# Patient Record
Sex: Female | Born: 1980
Health system: Southern US, Community
[De-identification: ages and names within clinical notes are randomized; demographics above are authoritative.]

## PROBLEM LIST (undated history)

## (undated) DIAGNOSIS — F419 Anxiety disorder, unspecified: Secondary | ICD-10-CM

## (undated) DIAGNOSIS — I82729 Chronic embolism and thrombosis of deep veins of unspecified upper extremity: Secondary | ICD-10-CM

## (undated) DIAGNOSIS — D571 Sickle-cell disease without crisis: Secondary | ICD-10-CM

## (undated) DIAGNOSIS — I10 Essential (primary) hypertension: Secondary | ICD-10-CM

## (undated) DIAGNOSIS — B019 Varicella without complication: Secondary | ICD-10-CM

## (undated) HISTORY — DX: Varicella without complication: B01.9

## (undated) HISTORY — PX: FOOT SURGERY: SHX648

---

## 1998-11-01 ENCOUNTER — Emergency Department (HOSPITAL_COMMUNITY): Admission: EM | Admit: 1998-11-01 | Discharge: 1998-11-01 | Payer: Self-pay | Admitting: Emergency Medicine

## 1998-12-28 ENCOUNTER — Emergency Department (HOSPITAL_COMMUNITY): Admission: EM | Admit: 1998-12-28 | Discharge: 1998-12-28 | Payer: Self-pay | Admitting: Emergency Medicine

## 1999-03-12 ENCOUNTER — Emergency Department (HOSPITAL_COMMUNITY): Admission: EM | Admit: 1999-03-12 | Discharge: 1999-03-12 | Payer: Self-pay | Admitting: Emergency Medicine

## 1999-09-10 ENCOUNTER — Emergency Department (HOSPITAL_COMMUNITY): Admission: EM | Admit: 1999-09-10 | Discharge: 1999-09-10 | Payer: Self-pay | Admitting: Emergency Medicine

## 2000-01-04 ENCOUNTER — Emergency Department (HOSPITAL_COMMUNITY): Admission: EM | Admit: 2000-01-04 | Discharge: 2000-01-04 | Payer: Self-pay | Admitting: Emergency Medicine

## 2000-02-22 ENCOUNTER — Emergency Department (HOSPITAL_COMMUNITY): Admission: EM | Admit: 2000-02-22 | Discharge: 2000-02-22 | Payer: Self-pay | Admitting: Emergency Medicine

## 2000-03-30 ENCOUNTER — Emergency Department (HOSPITAL_COMMUNITY): Admission: EM | Admit: 2000-03-30 | Discharge: 2000-03-30 | Payer: Self-pay | Admitting: Emergency Medicine

## 2000-07-04 ENCOUNTER — Emergency Department (HOSPITAL_COMMUNITY): Admission: EM | Admit: 2000-07-04 | Discharge: 2000-07-04 | Payer: Self-pay

## 2000-10-06 ENCOUNTER — Emergency Department (HOSPITAL_COMMUNITY): Admission: EM | Admit: 2000-10-06 | Discharge: 2000-10-06 | Payer: Self-pay | Admitting: Emergency Medicine

## 2001-01-04 ENCOUNTER — Emergency Department (HOSPITAL_COMMUNITY): Admission: EM | Admit: 2001-01-04 | Discharge: 2001-01-04 | Payer: Self-pay | Admitting: Emergency Medicine

## 2001-03-15 ENCOUNTER — Inpatient Hospital Stay (HOSPITAL_COMMUNITY): Admission: AD | Admit: 2001-03-15 | Discharge: 2001-03-15 | Payer: Self-pay | Admitting: Obstetrics & Gynecology

## 2001-04-09 ENCOUNTER — Other Ambulatory Visit: Admission: RE | Admit: 2001-04-09 | Discharge: 2001-04-09 | Payer: Self-pay | Admitting: Obstetrics

## 2001-06-01 ENCOUNTER — Inpatient Hospital Stay (HOSPITAL_COMMUNITY): Admission: AD | Admit: 2001-06-01 | Discharge: 2001-06-01 | Payer: Self-pay | Admitting: Obstetrics

## 2001-10-19 ENCOUNTER — Inpatient Hospital Stay (HOSPITAL_COMMUNITY): Admission: AD | Admit: 2001-10-19 | Discharge: 2001-10-21 | Payer: Self-pay | Admitting: Obstetrics

## 2001-12-01 ENCOUNTER — Emergency Department (HOSPITAL_COMMUNITY): Admission: EM | Admit: 2001-12-01 | Discharge: 2001-12-01 | Payer: Self-pay | Admitting: Emergency Medicine

## 2002-02-24 ENCOUNTER — Emergency Department (HOSPITAL_COMMUNITY): Admission: EM | Admit: 2002-02-24 | Discharge: 2002-02-24 | Payer: Self-pay | Admitting: Emergency Medicine

## 2002-02-24 ENCOUNTER — Encounter: Payer: Self-pay | Admitting: Emergency Medicine

## 2003-02-14 ENCOUNTER — Inpatient Hospital Stay (HOSPITAL_COMMUNITY): Admission: AD | Admit: 2003-02-14 | Discharge: 2003-02-14 | Payer: Self-pay | Admitting: *Deleted

## 2003-02-14 ENCOUNTER — Encounter: Payer: Self-pay | Admitting: Obstetrics and Gynecology

## 2003-02-26 ENCOUNTER — Encounter: Admission: RE | Admit: 2003-02-26 | Discharge: 2003-02-26 | Payer: Self-pay | Admitting: Obstetrics and Gynecology

## 2003-03-16 ENCOUNTER — Inpatient Hospital Stay (HOSPITAL_COMMUNITY): Admission: AD | Admit: 2003-03-16 | Discharge: 2003-03-16 | Payer: Self-pay | Admitting: Obstetrics and Gynecology

## 2005-12-24 ENCOUNTER — Emergency Department (HOSPITAL_COMMUNITY): Admission: EM | Admit: 2005-12-24 | Discharge: 2005-12-24 | Payer: Self-pay | Admitting: Emergency Medicine

## 2007-07-17 ENCOUNTER — Emergency Department (HOSPITAL_COMMUNITY): Admission: EM | Admit: 2007-07-17 | Discharge: 2007-07-17 | Payer: Self-pay | Admitting: Emergency Medicine

## 2007-12-10 ENCOUNTER — Emergency Department (HOSPITAL_COMMUNITY): Admission: EM | Admit: 2007-12-10 | Discharge: 2007-12-10 | Payer: Self-pay | Admitting: *Deleted

## 2007-12-11 ENCOUNTER — Emergency Department (HOSPITAL_COMMUNITY): Admission: EM | Admit: 2007-12-11 | Discharge: 2007-12-11 | Payer: Self-pay | Admitting: Emergency Medicine

## 2010-01-17 ENCOUNTER — Emergency Department (HOSPITAL_COMMUNITY): Admission: EM | Admit: 2010-01-17 | Discharge: 2010-01-17 | Payer: Self-pay | Admitting: Emergency Medicine

## 2010-02-01 ENCOUNTER — Emergency Department (HOSPITAL_COMMUNITY): Admission: EM | Admit: 2010-02-01 | Discharge: 2010-02-01 | Payer: Self-pay | Admitting: Family Medicine

## 2010-12-28 ENCOUNTER — Emergency Department (HOSPITAL_COMMUNITY)
Admission: EM | Admit: 2010-12-28 | Discharge: 2010-12-28 | Payer: Self-pay | Source: Home / Self Care | Admitting: Emergency Medicine

## 2011-01-02 ENCOUNTER — Emergency Department (HOSPITAL_COMMUNITY)
Admission: EM | Admit: 2011-01-02 | Discharge: 2011-01-02 | Payer: Self-pay | Source: Home / Self Care | Admitting: Emergency Medicine

## 2011-03-05 LAB — CBC
HCT: 42.8 % (ref 36.0–46.0)
Hemoglobin: 14.7 g/dL (ref 12.0–15.0)
WBC: 4.4 10*3/uL (ref 4.0–10.5)

## 2011-03-05 LAB — DIFFERENTIAL
Basophils Absolute: 0 10*3/uL (ref 0.0–0.1)
Basophils Relative: 0 % (ref 0–1)
Eosinophils Absolute: 0 10*3/uL (ref 0.0–0.7)
Lymphocytes Relative: 21 % (ref 12–46)
Lymphs Abs: 1 10*3/uL (ref 0.7–4.0)
Monocytes Absolute: 0.5 10*3/uL (ref 0.1–1.0)
Neutrophils Relative %: 67 % (ref 43–77)

## 2011-03-05 LAB — BASIC METABOLIC PANEL
BUN: 6 mg/dL (ref 6–23)
Calcium: 8.4 mg/dL (ref 8.4–10.5)
Creatinine, Ser: 0.97 mg/dL (ref 0.4–1.2)
GFR calc Af Amer: >60 mL/min (ref >60)
Glucose, Bld: 96 mg/dL (ref 70–99)
Potassium: 3.1 mEq/L — ABNORMAL LOW (ref 3.5–5.1)

## 2011-05-03 ENCOUNTER — Emergency Department (HOSPITAL_COMMUNITY)
Admission: EM | Admit: 2011-05-03 | Discharge: 2011-05-03 | Disposition: A | Discharge status: Home / Self Care | Payer: Medicaid Other | Attending: Emergency Medicine | Admitting: Emergency Medicine

## 2011-05-03 DIAGNOSIS — D571 Sickle-cell disease without crisis: Secondary | ICD-10-CM | POA: Insufficient documentation

## 2011-05-03 DIAGNOSIS — F411 Generalized anxiety disorder: Secondary | ICD-10-CM | POA: Insufficient documentation

## 2011-05-03 DIAGNOSIS — B360 Pityriasis versicolor: Secondary | ICD-10-CM | POA: Insufficient documentation

## 2011-09-22 LAB — POCT PREGNANCY, URINE
Operator id: 282201
Preg Test, Ur: NEGATIVE

## 2011-10-02 LAB — CBC
HCT: 37.8
Hemoglobin: 13.1
MCV: 85.5
Platelets: 230
RDW: 13.3

## 2011-10-02 LAB — DIFFERENTIAL
Eosinophils Absolute: 0
Eosinophils Relative: 0
Lymphocytes Relative: 13
Monocytes Relative: 4

## 2011-10-02 LAB — COMPREHENSIVE METABOLIC PANEL
ALT: 15
AST: 17
Albumin: 3.7
CO2: 24
Chloride: 102
GFR calc non Af Amer: >60
Sodium: 136
Total Bilirubin: 1.1
Total Protein: 6.8

## 2011-10-02 LAB — URINALYSIS, ROUTINE W REFLEX MICROSCOPIC
Ketones, ur: >80 — AB
Leukocytes, UA: NEGATIVE
Nitrite: NEGATIVE
Protein, ur: 30 — AB
Urobilinogen, UA: 1

## 2012-01-16 ENCOUNTER — Encounter (HOSPITAL_COMMUNITY): Payer: Self-pay | Admitting: *Deleted

## 2012-01-16 ENCOUNTER — Emergency Department (HOSPITAL_COMMUNITY)
Admission: EM | Admit: 2012-01-16 | Discharge: 2012-01-17 | Disposition: A | Discharge status: Home / Self Care | Payer: Self-pay | Attending: Emergency Medicine | Admitting: Emergency Medicine

## 2012-01-16 DIAGNOSIS — D571 Sickle-cell disease without crisis: Secondary | ICD-10-CM | POA: Insufficient documentation

## 2012-01-16 DIAGNOSIS — R059 Cough, unspecified: Secondary | ICD-10-CM | POA: Insufficient documentation

## 2012-01-16 DIAGNOSIS — A599 Trichomoniasis, unspecified: Secondary | ICD-10-CM | POA: Insufficient documentation

## 2012-01-16 DIAGNOSIS — E860 Dehydration: Secondary | ICD-10-CM | POA: Insufficient documentation

## 2012-01-16 DIAGNOSIS — R05 Cough: Secondary | ICD-10-CM | POA: Insufficient documentation

## 2012-01-16 DIAGNOSIS — R42 Dizziness and giddiness: Secondary | ICD-10-CM | POA: Insufficient documentation

## 2012-01-16 DIAGNOSIS — R0602 Shortness of breath: Secondary | ICD-10-CM | POA: Insufficient documentation

## 2012-01-16 NOTE — ED Notes (Signed)
The pt is c/o dizziness headache and high bp today.

## 2012-01-17 ENCOUNTER — Emergency Department (HOSPITAL_COMMUNITY): Payer: Self-pay

## 2012-01-17 LAB — CBC
MCH: 29.3 pg (ref 26.0–34.0)
MCHC: 36.4 g/dL — ABNORMAL HIGH (ref 30.0–36.0)
MCV: 80.4 fL (ref 78.0–100.0)
Platelets: 200 10*3/uL (ref 150–400)
RBC: 4.34 MIL/uL (ref 3.87–5.11)
RDW: 13.9 % (ref 11.5–15.5)
WBC: 10 10*3/uL (ref 4.0–10.5)

## 2012-01-17 LAB — BASIC METABOLIC PANEL
BUN: 16 mg/dL (ref 6–23)
CO2: 23 mEq/L (ref 19–32)
Chloride: 105 mEq/L (ref 96–112)
Creatinine, Ser: 0.89 mg/dL (ref 0.50–1.10)
GFR calc Af Amer: >90 mL/min (ref >90)
Glucose, Bld: 101 mg/dL — ABNORMAL HIGH (ref 70–99)
Potassium: 3.6 mEq/L (ref 3.5–5.1)

## 2012-01-17 LAB — URINE MICROSCOPIC-ADD ON

## 2012-01-17 LAB — URINALYSIS, ROUTINE W REFLEX MICROSCOPIC: Specific Gravity, Urine: 1.013 (ref 1.005–1.030)

## 2012-01-17 LAB — PREGNANCY, URINE: Preg Test, Ur: NEGATIVE

## 2012-01-17 MED ORDER — METRONIDAZOLE 500 MG PO TABS
2000.0000 mg | ORAL_TABLET | Freq: Once | ORAL | Status: AC
  Administered 2012-01-17: 2000 mg via ORAL
  Filled 2012-01-17: qty 4

## 2012-01-17 MED ORDER — ONDANSETRON HCL 4 MG/2ML IJ SOLN
4.0000 mg | Freq: Once | INTRAMUSCULAR | Status: AC
  Administered 2012-01-17: 4 mg via INTRAVENOUS
  Filled 2012-01-17: qty 2

## 2012-01-17 MED ORDER — SODIUM CHLORIDE 0.9 % IV BOLUS (SEPSIS)
1000.0000 mL | Freq: Once | INTRAVENOUS | Status: AC
  Administered 2012-01-17: 1000 mL via INTRAVENOUS

## 2012-01-17 MED ORDER — MORPHINE SULFATE 4 MG/ML IJ SOLN
4.0000 mg | Freq: Once | INTRAMUSCULAR | Status: AC
  Administered 2012-01-17: 4 mg via INTRAVENOUS
  Filled 2012-01-17: qty 1

## 2012-01-17 NOTE — ED Notes (Signed)
Pt. C/o headache, dizziness, light sensitivity, pt. States BP been elevated since earlier this pm.

## 2012-01-17 NOTE — ED Provider Notes (Signed)
History     CSN: [id number]  Arrival date & time 01/16/12  2155   First MD Initiated Contact with Patient 01/17/12 0031      Chief Complaint  Patient presents with  . Dizziness     The history is provided by the patient.   the patient reports lightheadedness today.  She reports several episodes of standing up and feeling lightheaded.  She was near syncopal however she had no true syncope.  She denies chest pain and palpitations.  She reported several transient episodes of shortness of breath.  She denies unilateral leg swelling.  No prior history of DVT or PE.  She does have a history of sickle cell disease.  She's never required blood transfusions before in the past.  She does report cough that has been productive for several days.  She denies dysuria or urinary frequency.  She denies melena or hematochezia.  She does report decreased by mouth intake today.  She reports her urine is dark in color.  She reports she's currently on her menstrual cycle.  Nothing worsens her symptoms besides standing up.  Nothing improves her symptoms.  Her symptoms are intermittent.  She denies vertiginous symptoms  History reviewed. No pertinent past medical history.  History reviewed. No pertinent past surgical history.  History reviewed. No pertinent family history.  History  Substance Use Topics  . Smoking status: Current Everyday Smoker  . Smokeless tobacco: Not on file  . Alcohol Use: Yes    OB History    Grav Para Term Preterm Abortions TAB SAB Ect Mult Living                  Review of Systems  All other systems reviewed and are negative.    Allergies  Review of patient's allergies indicates no known allergies.  Home Medications  No current outpatient prescriptions on file.  BP 157/100  Pulse 80  Temp(Src) 99.1 F (37.3 C) (Oral)  Resp 20  SpO2 97%  LMP 01/12/2012  Physical Exam  Nursing note and vitals reviewed. Constitutional: She is oriented to person, place, and time.  She appears well-developed and well-nourished. No distress.  HENT:  Head: Normocephalic and atraumatic.       Mucous membranes dry  Eyes: EOM are normal.  Neck: Normal range of motion.  Cardiovascular: Normal rate, regular rhythm and normal heart sounds.   Pulmonary/Chest: Effort normal and breath sounds normal.  Abdominal: Soft. She exhibits no distension. There is no tenderness.  Musculoskeletal: Normal range of motion.  Neurological: She is alert and oriented to person, place, and time.  Skin: Skin is warm and dry.  Psychiatric: She has a normal mood and affect. Judgment normal.    ED Course  Procedures (including critical care time)  Labs Reviewed  CBC - Abnormal; Notable for the following:    HCT 34.9 (*)    MCHC 36.4 (*)    All other components within normal limits  BASIC METABOLIC PANEL - Abnormal; Notable for the following:    Glucose, Bld 101 (*)    GFR calc non Af Amer 85 (*)    All other components within normal limits  URINALYSIS, ROUTINE W REFLEX MICROSCOPIC - Abnormal; Notable for the following:    APPearance CLOUDY (*)    Hgb urine dipstick LARGE (*)    Leukocytes, UA LARGE (*)    All other components within normal limits  URINE MICROSCOPIC-ADD ON - Abnormal; Notable for the following:    Squamous Epithelial /  LPF FEW (*)    All other components within normal limits  PREGNANCY, URINE   Dg Chest 2 View  01/17/2012  *RADIOLOGY REPORT*  Clinical Data: Shortness of breath, lightheaded, cough  CHEST - 2 VIEW  Comparison: 01/17/2010  Findings: Upper-normal size of cardiac silhouette. Mediastinal contours and pulmonary vascularity normal. Lungs clear. No pleural effusion or pneumothorax. Minimal chronic peribronchial thickening. Bones unremarkable.  IMPRESSION: Minimal chronic bronchitic changes. No acute infiltrate.  Original Report Authenticated By: Lollie Marrow, M.D.   I personally reviewed the x-ray  1. Dehydration   2. Trichomonas       MDM  The patient  is well-appearing.  She feels much better after IV fluids.  She's had decreased by mouth intake.  I suspect her lightheadedness is secondary to volume depletion.  She's been informed of the diagnosis of trichomonas.  She's been given 2 g of Flagyl in the ER.  She's been told to go to the health department for additional STD screening.  She's been recommended to the all of her sexual partners to be evaluated as well and to not resume intercourse with anyone until they have been seen and evaluated        Lyanne Co, MD 01/17/12 [phone]

## 2012-02-01 ENCOUNTER — Emergency Department (HOSPITAL_COMMUNITY): Payer: Medicaid Other

## 2012-02-01 ENCOUNTER — Encounter (HOSPITAL_COMMUNITY): Payer: Self-pay

## 2012-02-01 ENCOUNTER — Emergency Department (HOSPITAL_COMMUNITY)
Admission: EM | Admit: 2012-02-01 | Discharge: 2012-02-01 | Disposition: A | Discharge status: Home / Self Care | Payer: Medicaid Other | Attending: Emergency Medicine | Admitting: Emergency Medicine

## 2012-02-01 DIAGNOSIS — M25569 Pain in unspecified knee: Secondary | ICD-10-CM | POA: Insufficient documentation

## 2012-02-01 DIAGNOSIS — S161XXA Strain of muscle, fascia and tendon at neck level, initial encounter: Secondary | ICD-10-CM

## 2012-02-01 DIAGNOSIS — S139XXA Sprain of joints and ligaments of unspecified parts of neck, initial encounter: Secondary | ICD-10-CM | POA: Insufficient documentation

## 2012-02-01 DIAGNOSIS — M545 Low back pain, unspecified: Secondary | ICD-10-CM | POA: Insufficient documentation

## 2012-02-01 DIAGNOSIS — S8000XA Contusion of unspecified knee, initial encounter: Secondary | ICD-10-CM | POA: Insufficient documentation

## 2012-02-01 DIAGNOSIS — D571 Sickle-cell disease without crisis: Secondary | ICD-10-CM | POA: Insufficient documentation

## 2012-02-01 DIAGNOSIS — F17200 Nicotine dependence, unspecified, uncomplicated: Secondary | ICD-10-CM | POA: Insufficient documentation

## 2012-02-01 DIAGNOSIS — M542 Cervicalgia: Secondary | ICD-10-CM | POA: Insufficient documentation

## 2012-02-01 DIAGNOSIS — R51 Headache: Secondary | ICD-10-CM | POA: Insufficient documentation

## 2012-02-01 HISTORY — DX: Anxiety disorder, unspecified: F41.9

## 2012-02-01 HISTORY — DX: Sickle-cell disease without crisis: D57.1

## 2012-02-01 LAB — URINALYSIS, ROUTINE W REFLEX MICROSCOPIC
Bilirubin Urine: NEGATIVE
Leukocytes, UA: NEGATIVE
Nitrite: NEGATIVE
Specific Gravity, Urine: 1.013 (ref 1.005–1.030)
Urobilinogen, UA: 0.2 mg/dL (ref 0.0–1.0)
pH: 7 (ref 5.0–8.0)

## 2012-02-01 LAB — CBC
HCT: 38.5 % (ref 36.0–46.0)
Hemoglobin: 13.7 g/dL (ref 12.0–15.0)
MCH: 29.1 pg (ref 26.0–34.0)
MCHC: 35.6 g/dL (ref 30.0–36.0)
RBC: 4.71 MIL/uL (ref 3.87–5.11)

## 2012-02-01 LAB — BASIC METABOLIC PANEL
BUN: 16 mg/dL (ref 6–23)
CO2: 25 mEq/L (ref 19–32)
GFR calc non Af Amer: >90 mL/min (ref >90)
Glucose, Bld: 88 mg/dL (ref 70–99)
Potassium: 3.6 mEq/L (ref 3.5–5.1)
Sodium: 139 mEq/L (ref 135–145)

## 2012-02-01 LAB — DIFFERENTIAL
Basophils Relative: 0 % (ref 0–1)
Eosinophils Absolute: 0 10*3/uL (ref 0.0–0.7)
Eosinophils Relative: 0 % (ref 0–5)
Lymphocytes Relative: 15 % (ref 12–46)
Neutro Abs: 9.5 10*3/uL — ABNORMAL HIGH (ref 1.7–7.7)

## 2012-02-01 LAB — APTT: aPTT: 34 seconds (ref 24–37)

## 2012-02-01 LAB — PROTIME-INR: INR: 1.09 (ref 0.00–1.49)

## 2012-02-01 LAB — PREGNANCY, URINE: Preg Test, Ur: NEGATIVE

## 2012-02-01 MED ORDER — SODIUM CHLORIDE 0.9 % IV BOLUS (SEPSIS): 1000.0000 mL | Freq: Once | INTRAVENOUS | Status: DC

## 2012-02-01 MED ORDER — ONDANSETRON HCL 8 MG PO TABS: 4.0000 mg | ORAL_TABLET | Freq: Once | ORAL | Status: DC

## 2012-02-01 MED ORDER — ONDANSETRON 4 MG PO TBDP
ORAL_TABLET | ORAL | Status: AC
  Administered 2012-02-01: 4 mg
  Filled 2012-02-01: qty 1

## 2012-02-01 MED ORDER — METHOCARBAMOL 500 MG PO TABS: 500.0000 mg | ORAL_TABLET | Freq: Two times a day (BID) | ORAL | Status: AC

## 2012-02-01 MED ORDER — IBUPROFEN 600 MG PO TABS: 600.0000 mg | ORAL_TABLET | Freq: Four times a day (QID) | ORAL | Status: AC | PRN

## 2012-02-01 MED ORDER — TRAMADOL HCL 50 MG PO TABS: 50.0000 mg | ORAL_TABLET | Freq: Four times a day (QID) | ORAL | Status: AC | PRN

## 2012-02-01 NOTE — ED Notes (Signed)
Pt's BP was 188/113 - pt stated she was told that her anxiety triggers her anxiety. Pt stated she is feeling anxious rating 10/10

## 2012-02-01 NOTE — ED Notes (Signed)
Patient transported to X-ray 

## 2012-02-01 NOTE — ED Notes (Signed)
Pt able to ambulate to bathroom without assistance and denies pain.

## 2012-02-01 NOTE — ED Notes (Signed)
Pt c/o nausea. Med as ordered.

## 2012-02-01 NOTE — ED Notes (Signed)
Pt c/o nausea.  

## 2012-02-01 NOTE — ED Notes (Signed)
Pt. ambulates without assistance.
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
## 2012-02-01 NOTE — ED Notes (Signed)
Made one attempt to start IV.  Pt stated she did not want to be stuck again.  Will notify MD.

## 2012-02-01 NOTE — ED Notes (Signed)
amb in the hall with assist,tol well. C/o lower back pain.

## 2012-02-01 NOTE — ED Notes (Signed)
B/P taken at right fore arm 173/103

## 2012-02-01 NOTE — Discharge Instructions (Signed)
Cervical Strain Care After A cervical strain is when the muscles and ligaments in your neck have been stretched. The bones are not broken. If you had any problems moving your arms or legs immediately after the injury, even if the problem has gone away, make sure to tell this to your caregiver.  HOME CARE INSTRUCTIONS   While awake, apply ice packs to the neck or areas of pain about every 1 to 2 hours, for 15 to 20 minutes at a time. Do this for 2 days. If you were given a cervical collar for support, ask your caregiver if you may remove it for bathing or applying ice.   If given a cervical collar, wear as instructed. Do not remove any collar unless instructed by a caregiver.   Only take over-the-counter or prescription medicines for pain, discomfort, or fever as directed by your caregiver.  Recheck with the hospital or clinic after a radiologist has read your X-rays. Recheck with the hospital or clinic to make sure the initial readings are correct. Do this also to determine if you need further studies. It is your responsibility to find out your X-ray results. X-rays are sometimes repeated in one week to ten days. These are often repeated to make sure that a hairline fracture was not overlooked. Ask your caregiver how you are to find out about your radiology (X-ray) results. SEEK IMMEDIATE MEDICAL CARE IF:   You have increasing pain in your neck.   You develop difficulties swallowing or breathing.   You have numbness, weakness, or movement problems in the arms or legs.   You have difficulty walking.   You develop bowel or bladder retention or incontinence.   You have problems with walking.  MAKE SURE YOU:   Understand these instructions.   Will watch your condition.   Will get help right away if you are not doing well or get worse.  Document Released: 12/04/2005 Document Revised: 08/16/2011 Document Reviewed: 07/17/2008 [employer] Patient Information 2012 [employer]. 
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
## 2012-02-28 ENCOUNTER — Emergency Department (INDEPENDENT_AMBULATORY_CARE_PROVIDER_SITE_OTHER)
Admission: EM | Admit: 2012-02-28 | Discharge: 2012-02-28 | Disposition: A | Discharge status: Home Health | Payer: Self-pay | Source: Home / Self Care | Attending: Emergency Medicine | Admitting: Emergency Medicine

## 2012-02-28 ENCOUNTER — Encounter (HOSPITAL_COMMUNITY): Payer: Self-pay | Admitting: *Deleted

## 2012-02-28 DIAGNOSIS — L02412 Cutaneous abscess of left axilla: Secondary | ICD-10-CM

## 2012-02-28 DIAGNOSIS — IMO0002 Reserved for concepts with insufficient information to code with codable children: Secondary | ICD-10-CM

## 2012-02-28 HISTORY — DX: Essential (primary) hypertension: I10

## 2012-02-28 MED ORDER — DOXYCYCLINE HYCLATE 100 MG PO CAPS: 100.0000 mg | ORAL_CAPSULE | Freq: Two times a day (BID) | ORAL | Status: AC

## 2012-02-28 MED ORDER — LIDOCAINE-EPINEPHRINE 2 %-1:100000 IJ SOLN: 5.0000 mL | Freq: Once | INTRAMUSCULAR | Status: DC

## 2012-02-28 MED ORDER — IBUPROFEN 600 MG PO TABS: 600.0000 mg | ORAL_TABLET | Freq: Four times a day (QID) | ORAL | Status: AC | PRN

## 2012-02-28 MED ORDER — BACITRACIN 500 UNIT/GM EX OINT: 1.0000 "application " | TOPICAL_OINTMENT | Freq: Once | CUTANEOUS | Status: DC

## 2012-02-28 MED ORDER — HYDROCODONE-ACETAMINOPHEN 5-325 MG PO TABS: 2.0000 | ORAL_TABLET | ORAL | Status: AC | PRN

## 2012-02-28 NOTE — ED Notes (Signed)
Pt is here with complaints of boil/abcess under left arm.  Pt states onset 2 days ago.

## 2012-02-28 NOTE — ED Provider Notes (Signed)
History     CSN: [id number]  Arrival date & time 02/28/12  0809   First MD Initiated Contact with Patient 02/28/12 [phone]      Chief Complaint  Patient presents with  . Recurrent Skin Infections    (Consider location/radiation/quality/duration/timing/severity/associated sxs/prior treatment) HPI Comments: Pt with painful erythematous mass of gradually increasing size in left axilla x 2 days. No N/V, fevers, drainage.Does not recall trauma to the area.  Has been applying warm compresses, alcohol /wo relief. Has not tried anything for pain. Pt is not a diabetic.   Patient is a 31 y.o. female presenting with abscess. The history is provided by the patient.  Abscess  This is a new problem. The onset was gradual. The problem has been gradually worsening. The abscess is present on the left arm. Pertinent negatives include no fever and no vomiting.    Past Medical History  Diagnosis Date  . Anxiety   . Sickle cell anemia   . Hypertension     History reviewed. No pertinent past surgical history.  History reviewed. No pertinent family history.  History  Substance Use Topics  . Smoking status: Current Everyday Smoker  . Smokeless tobacco: Not on file  . Alcohol Use: Yes    OB History    Grav Para Term Preterm Abortions TAB SAB Ect Mult Living                  Review of Systems  Constitutional: Negative for fever.  Gastrointestinal: Negative for nausea and vomiting.  Musculoskeletal: Negative for joint swelling.  Skin: Positive for color change.  Neurological: Negative for weakness.    Allergies  Review of patient's allergies indicates no known allergies.  Home Medications   Current Outpatient Rx  Name Route Sig Dispense Refill  . DOXYCYCLINE HYCLATE 100 MG PO CAPS Oral Take 1 capsule (100 mg total) by mouth 2 (two) times daily. 20 capsule 0  . HYDROCODONE-ACETAMINOPHEN 5-325 MG PO TABS Oral Take 2 tablets by mouth every 4 (four) hours as needed for pain. 20 tablet 0    . IBUPROFEN 600 MG PO TABS Oral Take 1 tablet (600 mg total) by mouth every 6 (six) hours as needed for pain. 30 tablet 0    BP 169/122  Pulse 79  Temp(Src) 98.5 F (36.9 C) (Oral)  Resp 18  SpO2 99%  LMP 02/19/2012  Physical Exam  Nursing note and vitals reviewed. Constitutional: She is oriented to person, place, and time. She appears well-developed and well-nourished. No distress.  HENT:  Head: Normocephalic and atraumatic.  Eyes: Conjunctivae and EOM are normal.  Neck: Normal range of motion.  Cardiovascular: Normal rate.   Pulmonary/Chest: Effort normal.  Abdominal: She exhibits no distension.  Musculoskeletal: Normal range of motion.  Lymphadenopathy:       6 x 3 cm area of tender induration with central fluctuance under left axilla  Neurological: She is alert and oriented to person, place, and time.  Skin: Skin is warm and dry.  Psychiatric: She has a normal mood and affect. Her behavior is normal. Judgment and thought content normal.    ED Course  INCISION AND DRAINAGE Date/Time: 02/28/2012 9:25 AM Performed by: Domenick Gong Authorized by: Domenick Gong Consent: Verbal consent obtained. Risks and benefits: risks, benefits and alternatives were discussed Consent given by: patient Patient understanding: patient states understanding of the procedure being performed Patient consent: the patient's understanding of the procedure matches consent given Required items: required blood products, implants, devices, and special  equipment available Patient identity confirmed: verbally with patient Time out: Immediately prior to procedure a "time out" was called to verify the correct patient, procedure, equipment, support staff and site/side marked as required. Type: abscess Location: L axillae. Anesthesia: local infiltration Local anesthetic: lidocaine 2% with epinephrine Anesthetic total: 3 ml Patient sedated: no Scalpel size: 11 Incision type:  cruciate. Complexity: simple Drainage: purulent Drainage amount: moderate Wound treatment: wound left open Patient tolerance: Patient tolerated the procedure well with no immediate complications. Comments: Applied bacitracin and sterile pressure dressing.   (including critical care time)   Labs Reviewed  CULTURE, ROUTINE-ABSCESS   No results found.   1. Abscess of axilla, left       MDM  Previous records reviewed, BP on visit 01/16/2012, 157/100 mmHg, on 02/01/2012 153/110 mmHg  Pt hypertensive today. Is in significant amount of pain today. Pt denies any CNS type sx such as HA, visual changes, focal paresis, or new onset seizure activity. Pt denies any CV sx such as CP, dyspnea, palpitations, pedal edema, tearing pain radiating to back or abd. Pt denied any renal sx such as anuria or hematuria. Pt denies illicit drug use, most notably cocaine, or recent use of OTC medications such as nasal decongestants. Discussed importance of lifestyle modifications as important first steps.  Pt to have this rechecked when she follows up here or with her PMD for wound check. Discussed sx/sn HTN emergency and pt to return to ED for this.      Domenick Gong, MD 02/28/12 1718

## 2012-03-02 LAB — CULTURE, ROUTINE-ABSCESS

## 2012-03-23 ENCOUNTER — Encounter (HOSPITAL_COMMUNITY): Payer: Self-pay | Admitting: Emergency Medicine

## 2012-03-23 ENCOUNTER — Other Ambulatory Visit: Payer: Self-pay

## 2012-03-23 ENCOUNTER — Emergency Department (HOSPITAL_COMMUNITY)
Admission: EM | Admit: 2012-03-23 | Discharge: 2012-03-23 | Disposition: A | Discharge status: Home / Self Care | Payer: Medicaid Other | Attending: Emergency Medicine | Admitting: Emergency Medicine

## 2012-03-23 DIAGNOSIS — R112 Nausea with vomiting, unspecified: Secondary | ICD-10-CM | POA: Insufficient documentation

## 2012-03-23 DIAGNOSIS — D571 Sickle-cell disease without crisis: Secondary | ICD-10-CM | POA: Insufficient documentation

## 2012-03-23 DIAGNOSIS — F17200 Nicotine dependence, unspecified, uncomplicated: Secondary | ICD-10-CM | POA: Insufficient documentation

## 2012-03-23 DIAGNOSIS — R51 Headache: Secondary | ICD-10-CM

## 2012-03-23 DIAGNOSIS — F410 Panic disorder [episodic paroxysmal anxiety] without agoraphobia: Secondary | ICD-10-CM

## 2012-03-23 LAB — URINALYSIS, ROUTINE W REFLEX MICROSCOPIC
Glucose, UA: NEGATIVE mg/dL
Ketones, ur: 15 mg/dL — AB
Leukocytes, UA: NEGATIVE
Protein, ur: NEGATIVE mg/dL
pH: 7 (ref 5.0–8.0)

## 2012-03-23 LAB — POCT I-STAT, CHEM 8
HCT: 46 % (ref 36.0–46.0)
Hemoglobin: 15.6 g/dL — ABNORMAL HIGH (ref 12.0–15.0)
Potassium: 3.4 mEq/L — ABNORMAL LOW (ref 3.5–5.1)
Sodium: 143 mEq/L (ref 135–145)
TCO2: 24 mmol/L (ref 0–100)

## 2012-03-23 LAB — POCT PREGNANCY, URINE: Preg Test, Ur: NEGATIVE

## 2012-03-23 LAB — URINE MICROSCOPIC-ADD ON

## 2012-03-23 MED ORDER — ONDANSETRON HCL 4 MG/2ML IJ SOLN
INTRAMUSCULAR | Status: AC
  Filled 2012-03-23: qty 2

## 2012-03-23 MED ORDER — METOCLOPRAMIDE HCL 5 MG/ML IJ SOLN
10.0000 mg | Freq: Once | INTRAMUSCULAR | Status: AC
  Administered 2012-03-23: 10 mg via INTRAVENOUS
  Filled 2012-03-23: qty 2

## 2012-03-23 MED ORDER — KETOROLAC TROMETHAMINE 30 MG/ML IJ SOLN
30.0000 mg | Freq: Once | INTRAMUSCULAR | Status: AC
  Administered 2012-03-23: 30 mg via INTRAVENOUS
  Filled 2012-03-23: qty 1

## 2012-03-23 MED ORDER — ONDANSETRON HCL 8 MG PO TABS: 8.0000 mg | ORAL_TABLET | Freq: Three times a day (TID) | ORAL | Status: AC | PRN

## 2012-03-23 NOTE — ED Provider Notes (Signed)
History     CSN: [id number]  Arrival date & time 03/23/12  1027   First MD Initiated Contact with Patient 03/23/12 1115      Chief Complaint  Patient presents with  . Nausea  . Weakness  . Emesis  . Diarrhea    (Consider location/radiation/quality/duration/timing/severity/associated sxs/prior treatment) HPI History provided by pt.   Pt had an intermittent, throbbing, frontal headache yesterday evening.  No relief w/ aleve.  Woke feeling generally weak this am.  Her son vomited and while she was cleaning it up, she developed N/V, chest tightness, SOB, tachycardia and lightheadedness.  Has h/o anxiety attacks and these sx are typical.  Had two episodes, each lasting approx 1 hour.  The same headache she had yesterday evening returned as well but she only experiences discomfort when she sits up.  Denies fever, cough, abdominal pain, diarrhea and GU sx.  H/o sickle cell anemia; otherwise healthy.  No h/o abd surgeries.    Past Medical History  Diagnosis Date  . Anxiety   . Sickle cell anemia     No past surgical history on file.  No family history on file.  History  Substance Use Topics  . Smoking status: Current Everyday Smoker -- 0.5 packs/day for 10 years    Types: Cigarettes  . Smokeless tobacco: Not on file  . Alcohol Use: Yes    OB History    Grav Para Term Preterm Abortions TAB SAB Ect Mult Living                  Review of Systems  All other systems reviewed and are negative.    Allergies  Review of patient's allergies indicates no known allergies.  Home Medications  No current outpatient prescriptions on file.  BP 71/28  Pulse 59  Temp(Src) 97.7 F (36.5 C) (Oral)  Resp 16  SpO2 82%  LMP 03/23/2012  Physical Exam  Nursing note and vitals reviewed. Constitutional: She is oriented to person, place, and time. She appears well-developed and well-nourished. No distress.  HENT:  Head: Normocephalic.  Mouth/Throat: Oropharynx is clear and moist.    Eyes:       Normal appearance  Neck: Normal range of motion.       No meningeal signs  Cardiovascular: Normal rate and regular rhythm.   Pulmonary/Chest: Effort normal and breath sounds normal.  Abdominal: Soft. Bowel sounds are normal. She exhibits no distension and no mass. There is no tenderness. There is no rebound and no guarding.  Neurological: She is alert and oriented to person, place, and time. No cranial nerve deficit or sensory deficit. Coordination normal.       5/5 and equal upper and lower extremity strength.  No past pointing.    Skin: Skin is warm and dry. No rash noted.  Psychiatric: She has a normal mood and affect. Her behavior is normal.    ED Course  Procedures (including critical care time)   Date: 03/23/2012  Rate: 82  Rhythm: normal sinus rhythm  QRS Axis: normal  Intervals: normal  ST/T Wave abnormalities: normal  Conduction Disutrbances:none  Narrative Interpretation:   Old EKG Reviewed: none available   Labs Reviewed  URINALYSIS, ROUTINE W REFLEX MICROSCOPIC - Abnormal; Notable for the following:    Hgb urine dipstick LARGE (*)    Ketones, ur 15 (*)    All other components within normal limits  POCT I-STAT, CHEM 8 - Abnormal; Notable for the following:    Potassium 3.4 (*)  Glucose, Bld 100 (*)    Hemoglobin 15.6 (*)    All other components within normal limits  POCT PREGNANCY, URINE  URINE MICROSCOPIC-ADD ON   No results found.   1. Headache   2. Anxiety attack   3. Nausea and vomiting       MDM  Pt w/ h/o sickle cell anemia and anxiety presents w/ c/o generalized weakness, headache, N/V as well as 2 typical anxiety attacks this morning  No acute findings on exam.  Labs unremarkable.  Pt received IV reglan and is getting IV NS as well.  Her headache is improved, no vomiting in ED and she is tolerating pos.  Only c/o currently is that she feels lightheaded when she sits up straight in bed.  No signs of dehydration on exam.  Will finish  1/2L bolus and check orthostatics prior to discharge.    Pt is not orthostatic.  Reports that her headache has started to return.  Will treat w/ IV toradol and d/c home w/ zofran.  Oral hydration and f/u with PCP for persistent sx recommended.  Return precautions discussed.       Arie Sabina [place], [place] 03/23/12 1511

## 2012-03-23 NOTE — ED Notes (Signed)
Crackers and Sprit give after verifying with PA.

## 2012-03-23 NOTE — ED Notes (Signed)
Patient arrives via [facility] with chief complaint of nausea vomiting and diarrhea since yesterday. Patient also complains of a headache and she also complains of an anxiety attack. She advises that she has a history of anxiety attacks for 5 years.

## 2012-03-23 NOTE — Discharge Instructions (Signed)
Take tylenol or ibuprofen as needed for headache.  Take zofran as needed for nausea.   Get rest and drink plenty of fluids.  Follow up with your primary care doctor if symptoms have not started to improve by Monday.  You should return to the ER if your headache worsens, you develop associated fever or uncontrolled vomiting.

## 2012-03-23 NOTE — ED Provider Notes (Signed)
Medical screening examination/treatment/procedure(s) were performed by non-physician practitioner and as supervising physician I was immediately available for consultation/collaboration.   Dayton Bailiff, MD 03/23/12 [phone]

## 2012-03-23 NOTE — ED Notes (Signed)
Patient discharge with significant other she states she feels much better. Pain level is now a 1/10 on the pain scale.

## 2013-01-28 ENCOUNTER — Emergency Department (HOSPITAL_COMMUNITY)
Admission: EM | Admit: 2013-01-28 | Discharge: 2013-01-28 | Disposition: A | Discharge status: Home / Self Care | Payer: BC Managed Care – PPO | Source: Home / Self Care

## 2013-01-28 ENCOUNTER — Encounter (HOSPITAL_COMMUNITY): Payer: Self-pay | Admitting: *Deleted

## 2013-01-28 DIAGNOSIS — B372 Candidiasis of skin and nail: Secondary | ICD-10-CM

## 2013-01-28 MED ORDER — CLOTRIMAZOLE-BETAMETHASONE 1-0.05 % EX CREA: TOPICAL_CREAM | CUTANEOUS | Status: DC

## 2013-01-28 NOTE — ED Notes (Signed)
Pt states " i got a rash under my titties"

## 2013-01-28 NOTE — ED Provider Notes (Signed)
Medical screening examination/treatment/procedure(s) were performed by resident physician or non-physician practitioner and as supervising physician I was immediately available for consultation/collaboration.   Jhanvi Drakeford DOUGLAS MD.   Jonni Oelkers D Kori Goins, MD 01/28/13 1351 

## 2013-01-28 NOTE — ED Provider Notes (Signed)
History     CSN: [id number]  Arrival date & time 01/28/13  1003   First MD Initiated Contact with Patient 01/28/13 1026      No chief complaint on file.   (Consider location/radiation/quality/duration/timing/severity/associated sxs/prior treatment) HPI Comments: 32 year old female has a rash under in the left breast and between the breasts it started several days ago. She complains of associated itching. She says she sweats a lot in the area stays moist. No other areas are affected   Past Medical History  Diagnosis Date  . Anxiety   . Sickle cell anemia     No past surgical history on file.  No family history on file.  History  Substance Use Topics  . Smoking status: Current Every Day Smoker -- 0.50 packs/day for 10 years    Types: Cigarettes  . Smokeless tobacco: Not on file  . Alcohol Use: Yes    OB History   Grav Para Term Preterm Abortions TAB SAB Ect Mult Living                  Review of Systems  HENT: Negative.   Skin:       As per history of present illness  Psychiatric/Behavioral: Negative.   All other systems reviewed and are negative.    Allergies  Review of patient's allergies indicates no known allergies.  Home Medications   Current Outpatient Rx  Name  Route  Sig  Dispense  Refill  . clotrimazole-betamethasone (LOTRISONE) cream      Apply to affected area 2 times daily for 2 weeks   30 g   0     BP 144/85  Pulse 78  Temp(Src) 98.7 F (37.1 C) (Oral)  Resp 16  SpO2 100%  Physical Exam  Constitutional: She is oriented to person, place, and time. She appears well-developed and well-nourished. No distress.  Neck: Normal range of motion. Neck supple.  Cardiovascular: Normal rate.   Pulmonary/Chest: Effort normal.  Neurological: She is alert and oriented to person, place, and time.  Skin: Skin is warm and dry. Rash noted.  Well marginated slightly red rash in the crease of the left breast where there is skin on skin contact.  Several satellite lesions developing on the left medial breast proper.    ED Course  Procedures (including critical care time)  Labs Reviewed - No data to display No results found.   1. Candidal intertrigo       MDM  Lotrisone cream to affected area twice a day. Apply for one week after the lesions are gone. Keep area dry Total your doctor as needed. For any new symptoms problems or worsening may return         Hayden Rasmussen, NP 01/28/13 1047

## 2013-04-23 ENCOUNTER — Emergency Department (HOSPITAL_COMMUNITY)
Admission: EM | Admit: 2013-04-23 | Discharge: 2013-04-23 | Disposition: A | Discharge status: Home / Self Care | Payer: BC Managed Care – PPO | Attending: Emergency Medicine | Admitting: Emergency Medicine

## 2013-04-23 ENCOUNTER — Encounter (HOSPITAL_COMMUNITY): Payer: Self-pay | Admitting: *Deleted

## 2013-04-23 ENCOUNTER — Emergency Department (HOSPITAL_COMMUNITY): Payer: BC Managed Care – PPO

## 2013-04-23 DIAGNOSIS — M79609 Pain in unspecified limb: Secondary | ICD-10-CM

## 2013-04-23 DIAGNOSIS — M7989 Other specified soft tissue disorders: Secondary | ICD-10-CM

## 2013-04-23 DIAGNOSIS — I82609 Acute embolism and thrombosis of unspecified veins of unspecified upper extremity: Secondary | ICD-10-CM | POA: Insufficient documentation

## 2013-04-23 DIAGNOSIS — F17200 Nicotine dependence, unspecified, uncomplicated: Secondary | ICD-10-CM | POA: Insufficient documentation

## 2013-04-23 DIAGNOSIS — I10 Essential (primary) hypertension: Secondary | ICD-10-CM

## 2013-04-23 DIAGNOSIS — Z8659 Personal history of other mental and behavioral disorders: Secondary | ICD-10-CM | POA: Insufficient documentation

## 2013-04-23 DIAGNOSIS — I82621 Acute embolism and thrombosis of deep veins of right upper extremity: Secondary | ICD-10-CM

## 2013-04-23 DIAGNOSIS — Z862 Personal history of diseases of the blood and blood-forming organs and certain disorders involving the immune mechanism: Secondary | ICD-10-CM | POA: Insufficient documentation

## 2013-04-23 LAB — RETICULOCYTES
RBC.: 4.64 MIL/uL (ref 3.87–5.11)
Retic Count, Absolute: 74.2 10*3/uL (ref 19.0–186.0)
Retic Ct Pct: 1.6 % (ref 0.4–3.1)

## 2013-04-23 LAB — CBC WITH DIFFERENTIAL/PLATELET
Eosinophils Absolute: 0 10*3/uL (ref 0.0–0.7)
Hemoglobin: 13.7 g/dL (ref 12.0–15.0)
Lymphocytes Relative: 16 % (ref 12–46)
Lymphs Abs: 2 10*3/uL (ref 0.7–4.0)
Neutro Abs: 9.5 10*3/uL — ABNORMAL HIGH (ref 1.7–7.7)
Neutrophils Relative %: 78 % — ABNORMAL HIGH (ref 43–77)
Platelets: 195 10*3/uL (ref 150–400)
RBC: 4.64 MIL/uL (ref 3.87–5.11)
WBC: 12.2 10*3/uL — ABNORMAL HIGH (ref 4.0–10.5)

## 2013-04-23 LAB — COMPREHENSIVE METABOLIC PANEL
AST: 17 U/L (ref 0–37)
Albumin: 3.9 g/dL (ref 3.5–5.2)
Alkaline Phosphatase: 49 U/L (ref 39–117)
BUN: 16 mg/dL (ref 6–23)
Potassium: 4 mEq/L (ref 3.5–5.1)
Total Protein: 7.2 g/dL (ref 6.0–8.3)

## 2013-04-23 LAB — PROTIME-INR
INR: 1.08 (ref 0.00–1.49)
Prothrombin Time: 13.9 seconds (ref 11.6–15.2)

## 2013-04-23 MED ORDER — RIVAROXABAN 15 MG PO TABS: ORAL_TABLET | ORAL | Status: DC

## 2013-04-23 MED ORDER — ENOXAPARIN SODIUM 100 MG/ML ~~LOC~~ SOLN
100.0000 mg | SUBCUTANEOUS | Status: DC
  Administered 2013-04-23: 100 mg via SUBCUTANEOUS
  Filled 2013-04-23: qty 1

## 2013-04-23 NOTE — ED Notes (Signed)
Vascular lab called to advise patient is positive for dvt in R forearm.   Relayed information to [place], [place].

## 2013-04-23 NOTE — ED Notes (Signed)
Charge RN aware of change of acuity.  Will wait to move patient at this time until we know if patient will be going home or coming in to the hospital.

## 2013-04-23 NOTE — ED Notes (Signed)
Patient transported to Ultrasound 

## 2013-04-23 NOTE — Progress Notes (Signed)
ANTICOAGULATION CONSULT NOTE - Initial Consult  Pharmacy Consult for Lovenox Indication: DVT  No Known Allergies  Patient Measurements: Weight: 150 lb (68.04 kg)  Vital Signs: Temp: 98.1 F (36.7 C) (05/07 0846) Temp src: Oral (05/07 0846) BP: 160/96 mmHg (05/07 1316) Pulse Rate: 69 (05/07 1316)  Labs:  Recent Labs  04/23/13 1055  HGB 13.7  HCT 36.8  PLT 195  CREATININE 0.86    CrCl is unknown because there is no height on file for the current visit.   Medical History: Past Medical History  Diagnosis Date  . Anxiety   . Sickle cell anemia     Medications:   (Not in a hospital admission)  Assessment: 32 y/o female patient admitted with right forearm swelling and pain, found to have ulnar vein thrombus requiring anticoagulation.  Goal of Therapy:  Anti-Xa level 0.6-1.2 units/ml 4hrs after LMWH dose given Monitor platelets by anticoagulation protocol: Yes   Plan:  Will begin lovenox treatment dose 100mg  sq q24h. (1.5mg /kg/day), will f/u if patient is to be admitted to hosptial. Recommend starting coumadin 5mg  daily if patient discharged.  Verlene Mayer, PharmD, BCPS Pager [phone] 04/23/2013,4:18 PM

## 2013-04-23 NOTE — Progress Notes (Signed)
*  Preliminary Results* Right upper extremity venous duplex completed. Right upper extremity is positive for deep vein thrombosis involving a single right ulnar vein.  04/23/2013 2:58 PM Gertie Fey, RDMS, RDCS

## 2013-04-23 NOTE — ED Notes (Signed)
Pt reports swelling to right upper arm x 3 weeks, denies injury. Also having pain to right foot for extended amount of time, denies injury. Ambulatory at triage. No distress noted.

## 2013-04-23 NOTE — ED Notes (Signed)
Lovenox education given during injection.  Pt observed and verbalized understanding.
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
## 2013-04-27 NOTE — ED Provider Notes (Signed)
Medical screening examination/treatment/procedure(s) were conducted as a shared visit with non-physician practitioner(s) and myself.  I personally evaluated the patient during the encounter.  Doppler study of right upper extremity reveals a clot in the right humeral pain.  Anti-coagulation initiated. Followup at urgent care Center.  see PA note  Donnetta Hutching, MD 04/27/13 [phone]

## 2013-09-19 ENCOUNTER — Emergency Department (HOSPITAL_COMMUNITY): Payer: BC Managed Care – PPO

## 2013-09-19 ENCOUNTER — Encounter (HOSPITAL_COMMUNITY): Payer: Self-pay | Admitting: Emergency Medicine

## 2013-09-19 ENCOUNTER — Emergency Department (HOSPITAL_COMMUNITY)
Admission: EM | Admit: 2013-09-19 | Discharge: 2013-09-19 | Disposition: A | Discharge status: Home / Self Care | Payer: BC Managed Care – PPO | Attending: Emergency Medicine | Admitting: Emergency Medicine

## 2013-09-19 DIAGNOSIS — Z7901 Long term (current) use of anticoagulants: Secondary | ICD-10-CM | POA: Insufficient documentation

## 2013-09-19 DIAGNOSIS — I82629 Acute embolism and thrombosis of deep veins of unspecified upper extremity: Secondary | ICD-10-CM | POA: Diagnosis not present

## 2013-09-19 DIAGNOSIS — Z862 Personal history of diseases of the blood and blood-forming organs and certain disorders involving the immune mechanism: Secondary | ICD-10-CM | POA: Diagnosis not present

## 2013-09-19 DIAGNOSIS — Y92410 Unspecified street and highway as the place of occurrence of the external cause: Secondary | ICD-10-CM | POA: Insufficient documentation

## 2013-09-19 DIAGNOSIS — S0990XA Unspecified injury of head, initial encounter: Secondary | ICD-10-CM | POA: Insufficient documentation

## 2013-09-19 DIAGNOSIS — Y9389 Activity, other specified: Secondary | ICD-10-CM | POA: Diagnosis not present

## 2013-09-19 DIAGNOSIS — S4991XA Unspecified injury of right shoulder and upper arm, initial encounter: Secondary | ICD-10-CM

## 2013-09-19 DIAGNOSIS — Z79899 Other long term (current) drug therapy: Secondary | ICD-10-CM | POA: Diagnosis not present

## 2013-09-19 DIAGNOSIS — S161XXA Strain of muscle, fascia and tendon at neck level, initial encounter: Secondary | ICD-10-CM

## 2013-09-19 DIAGNOSIS — IMO0002 Reserved for concepts with insufficient information to code with codable children: Secondary | ICD-10-CM | POA: Insufficient documentation

## 2013-09-19 DIAGNOSIS — F17200 Nicotine dependence, unspecified, uncomplicated: Secondary | ICD-10-CM | POA: Insufficient documentation

## 2013-09-19 DIAGNOSIS — S139XXA Sprain of joints and ligaments of unspecified parts of neck, initial encounter: Secondary | ICD-10-CM | POA: Insufficient documentation

## 2013-09-19 DIAGNOSIS — Z8659 Personal history of other mental and behavioral disorders: Secondary | ICD-10-CM | POA: Insufficient documentation

## 2013-09-19 DIAGNOSIS — Z3202 Encounter for pregnancy test, result negative: Secondary | ICD-10-CM | POA: Insufficient documentation

## 2013-09-19 DIAGNOSIS — S0993XA Unspecified injury of face, initial encounter: Secondary | ICD-10-CM | POA: Diagnosis present

## 2013-09-19 LAB — POCT PREGNANCY, URINE: Preg Test, Ur: NEGATIVE

## 2013-09-19 MED ORDER — HYDROCODONE-ACETAMINOPHEN 5-325 MG PO TABS: 1.0000 | ORAL_TABLET | ORAL | Status: DC | PRN

## 2013-09-19 MED ORDER — HYDROMORPHONE HCL PF 1 MG/ML IJ SOLN
1.0000 mg | Freq: Once | INTRAMUSCULAR | Status: AC
  Administered 2013-09-19: 1 mg via INTRAMUSCULAR
  Filled 2013-09-19: qty 1
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
## 2013-09-19 NOTE — ED Notes (Signed)
Patient transported to X-ray 
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
## 2013-10-03 DIAGNOSIS — R0989 Other specified symptoms and signs involving the circulatory and respiratory systems: Secondary | ICD-10-CM | POA: Insufficient documentation

## 2013-10-03 DIAGNOSIS — M79609 Pain in unspecified limb: Secondary | ICD-10-CM | POA: Insufficient documentation

## 2013-10-03 DIAGNOSIS — F17200 Nicotine dependence, unspecified, uncomplicated: Secondary | ICD-10-CM | POA: Insufficient documentation

## 2013-10-03 DIAGNOSIS — Z86718 Personal history of other venous thrombosis and embolism: Secondary | ICD-10-CM | POA: Insufficient documentation

## 2013-10-03 DIAGNOSIS — Z7901 Long term (current) use of anticoagulants: Secondary | ICD-10-CM | POA: Insufficient documentation

## 2013-10-03 DIAGNOSIS — R0609 Other forms of dyspnea: Secondary | ICD-10-CM | POA: Insufficient documentation

## 2013-10-03 DIAGNOSIS — Z862 Personal history of diseases of the blood and blood-forming organs and certain disorders involving the immune mechanism: Secondary | ICD-10-CM | POA: Insufficient documentation

## 2013-10-03 DIAGNOSIS — Z8659 Personal history of other mental and behavioral disorders: Secondary | ICD-10-CM | POA: Insufficient documentation

## 2013-10-03 NOTE — ED Notes (Addendum)
Presents with SOB, vomiting, headache and dizziness that began at 10 pm this evening. Pt is tearful and not forthcoming with information. DX with blood clot in right arm in MAY. Bilateral breath sounds clear.  Will not answer when asked if she ishaving pain with breathing, "I just don't feel good" and shakes head and cries.

## 2013-10-04 ENCOUNTER — Emergency Department (HOSPITAL_COMMUNITY): Payer: BC Managed Care – PPO

## 2013-10-04 ENCOUNTER — Emergency Department (HOSPITAL_COMMUNITY)
Admission: EM | Admit: 2013-10-04 | Discharge: 2013-10-04 | Disposition: A | Discharge status: Home / Self Care | Payer: BC Managed Care – PPO | Attending: Emergency Medicine | Admitting: Emergency Medicine

## 2013-10-04 ENCOUNTER — Encounter (HOSPITAL_COMMUNITY): Payer: Self-pay | Admitting: Emergency Medicine

## 2013-10-04 DIAGNOSIS — R0600 Dyspnea, unspecified: Secondary | ICD-10-CM

## 2013-10-04 LAB — LIPASE, BLOOD: Lipase: 43 U/L (ref 11–59)

## 2013-10-04 LAB — CBC WITH DIFFERENTIAL/PLATELET
Basophils Absolute: 0 10*3/uL (ref 0.0–0.1)
Basophils Relative: 0 % (ref 0–1)
Eosinophils Absolute: 0 10*3/uL (ref 0.0–0.7)
Hemoglobin: 12.8 g/dL (ref 12.0–15.0)
Lymphs Abs: 1.5 10*3/uL (ref 0.7–4.0)
MCH: 28.1 pg (ref 26.0–34.0)
MCHC: 34.9 g/dL (ref 30.0–36.0)
Neutro Abs: 9.2 10*3/uL — ABNORMAL HIGH (ref 1.7–7.7)
Neutrophils Relative %: 81 % — ABNORMAL HIGH (ref 43–77)
Platelets: 235 10*3/uL (ref 150–400)
RBC: 4.56 MIL/uL (ref 3.87–5.11)

## 2013-10-04 LAB — COMPREHENSIVE METABOLIC PANEL
ALT: 14 U/L (ref 0–35)
AST: 18 U/L (ref 0–37)
Albumin: 4.3 g/dL (ref 3.5–5.2)
Alkaline Phosphatase: 58 U/L (ref 39–117)
CO2: 25 mEq/L (ref 19–32)
Chloride: 103 mEq/L (ref 96–112)
Creatinine, Ser: 0.89 mg/dL (ref 0.50–1.10)
Potassium: 3.7 mEq/L (ref 3.5–5.1)
Sodium: 139 mEq/L (ref 135–145)
Total Bilirubin: 0.4 mg/dL (ref 0.3–1.2)
Total Protein: 7.7 g/dL (ref 6.0–8.3)

## 2013-10-04 LAB — POCT I-STAT TROPONIN I: Troponin i, poc: 0.02 ng/mL (ref 0.00–0.08)

## 2013-10-04 MED ORDER — ONDANSETRON 4 MG PO TBDP
8.0000 mg | ORAL_TABLET | Freq: Once | ORAL | Status: AC
  Administered 2013-10-04: 8 mg via ORAL
  Filled 2013-10-04: qty 2

## 2013-10-04 MED ORDER — ALBUTEROL SULFATE HFA 108 (90 BASE) MCG/ACT IN AERS
2.0000 | INHALATION_SPRAY | RESPIRATORY_TRACT | Status: DC | PRN
  Administered 2013-10-04: 2 via RESPIRATORY_TRACT
  Filled 2013-10-04: qty 6.7

## 2013-10-04 MED ORDER — ALBUTEROL SULFATE HFA 108 (90 BASE) MCG/ACT IN AERS: 2.0000 | INHALATION_SPRAY | RESPIRATORY_TRACT | Status: DC | PRN

## 2013-10-04 MED ORDER — ALBUTEROL SULFATE (5 MG/ML) 0.5% IN NEBU
2.5000 mg | INHALATION_SOLUTION | Freq: Once | RESPIRATORY_TRACT | Status: AC
  Administered 2013-10-04: 2.5 mg via RESPIRATORY_TRACT
  Filled 2013-10-04 (×2): qty 0.5

## 2013-10-04 NOTE — ED Provider Notes (Signed)
CSN: [id number]     Arrival date & time 10/03/13  2346 History   First MD Initiated Contact with Patient 10/04/13 0015     Chief Complaint  Patient presents with  . Shortness of Breath   (Consider location/radiation/quality/duration/timing/severity/associated sxs/prior Treatment) Patient is a 32 y.o. female presenting with shortness of breath. The history is provided by the patient.  Shortness of Breath She had onset at about 9 PM of difficulty breathing. She denies chest pain, heaviness, tightness, pressure. She denies cough. Denies fever or chills. Nothing makes her dyspnea worse and nothing makes it better. She's not had symptoms like this before. She is feeling lightheaded and this noted a dry mouth but denies circumoral numbness or paresthesias in her hands. She is currently being treated for DVT of her right upper arm and states that she continues to have some pain in that arm and some mild swelling but it has not gotten any worse. She's noted some mild soreness in her right breast but does not recall any trauma. He  Past Medical History  Diagnosis Date  . Anxiety   . Sickle cell anemia     sickle cell trait   History reviewed. No pertinent past surgical history. History reviewed. No pertinent family history. History  Substance Use Topics  . Smoking status: Current Every Day Smoker -- 0.50 packs/day for 10 years    Types: Cigarettes  . Smokeless tobacco: Not on file  . Alcohol Use: Yes   OB History   Grav Para Term Preterm Abortions TAB SAB Ect Mult Living                 Review of Systems  Respiratory: Positive for shortness of breath.   All other systems reviewed and are negative.    Allergies  Review of patient's allergies indicates no known allergies.  Home Medications   Current Outpatient Rx  Name  Route  Sig  Dispense  Refill  . HYDROcodone-acetaminophen (NORCO) 5-325 MG per tablet   Oral   Take 1 tablet by mouth every 4 (four) hours as needed for pain.  15 tablet   0   . Rivaroxaban (XARELTO) 15 MG TABS tablet      Take 1 tablet (15 mg total) by mouth twice daily for 21 days.   42 tablet   0    BP 166/118  Pulse 100  Temp(Src) 97.9 F (36.6 C) (Oral)  Resp 24  SpO2 100%  LMP 10/01/2013 Physical Exam  Nursing note and vitals reviewed.  32 year old female, who is mildly tearful, but is in no acute distress. Vital signs are significant for tachypnea with respiratory rate of 24, and hypertension with blood pressure 166/118. Oxygen saturation is 100%, which is normal. Head is normocephalic and atraumatic. PERRLA, EOMI. Oropharynx is clear. Neck is nontender and supple without adenopathy or JVD. Back is nontender and there is no CVA tenderness. Lungs have a slightly prolonged exhalation phase without overt rales, wheezes, or rhonchi. Chest is nontender. There's mild tenderness in the lateral aspect of the right breast. Heart has regular rate and rhythm without murmur. Abdomen is soft, flat, nontender without masses or hepatosplenomegaly and peristalsis is normoactive. Extremities have no cyanosis or edema, full range of motion is present. Skin is warm and dry without rash. Neurologic: Mental status is normal, cranial nerves are intact, there are no motor or sensory deficits.  ED Course  Procedures (including critical care time) Labs Review Results for orders placed during the hospital  encounter of 10/04/13  CBC WITH DIFFERENTIAL      Result Value Range   WBC 11.4 (*) 4.0 - 10.5 K/uL   RBC 4.56  3.87 - 5.11 MIL/uL   Hemoglobin 12.8  12.0 - 15.0 g/dL   HCT 16.1  09.6 - 04.5 %   MCV 80.5  78.0 - 100.0 fL   MCH 28.1  26.0 - 34.0 pg   MCHC 34.9  30.0 - 36.0 g/dL   RDW 40.9  81.1 - 91.4 %   Platelets 235  150 - 400 K/uL   Neutrophils Relative % 81 (*) 43 - 77 %   Neutro Abs 9.2 (*) 1.7 - 7.7 K/uL   Lymphocytes Relative 13  12 - 46 %   Lymphs Abs 1.5  0.7 - 4.0 K/uL   Monocytes Relative 6  3 - 12 %   Monocytes Absolute 0.7   0.1 - 1.0 K/uL   Eosinophils Relative 0  0 - 5 %   Eosinophils Absolute 0.0  0.0 - 0.7 K/uL   Basophils Relative 0  0 - 1 %   Basophils Absolute 0.0  0.0 - 0.1 K/uL  COMPREHENSIVE METABOLIC PANEL      Result Value Range   Sodium 139  135 - 145 mEq/L   Potassium 3.7  3.5 - 5.1 mEq/L   Chloride 103  96 - 112 mEq/L   CO2 25  19 - 32 mEq/L   Glucose, Bld 135 (*) 70 - 99 mg/dL   BUN 10  6 - 23 mg/dL   Creatinine, Ser 7.82  0.50 - 1.10 mg/dL   Calcium 9.5  8.4 - 95.6 mg/dL   Total Protein 7.7  6.0 - 8.3 g/dL   Albumin 4.3  3.5 - 5.2 g/dL   AST 18  0 - 37 U/L   ALT 14  0 - 35 U/L   Alkaline Phosphatase 58  39 - 117 U/L   Total Bilirubin 0.4  0.3 - 1.2 mg/dL   GFR calc non Af Amer 84 (*) >90 mL/min   GFR calc Af Amer >90  >90 mL/min  LIPASE, BLOOD      Result Value Range   Lipase 43  11 - 59 U/L  POCT I-STAT TROPONIN I      Result Value Range   Troponin i, poc 0.02  0.00 - 0.08 ng/mL   Comment 3            Imaging Review Dg Chest 2 View  10/04/2013   CLINICAL DATA:  Shortness of breath  EXAM: CHEST  2 VIEW  COMPARISON:  Prior radiograph from 09/19/2013  FINDINGS: Allowing for differences in technique, the cardiac and mediastinal silhouettes is not significantly changed, and remain within normal limits.  Lungs are normally inflated. Apparent increased opacity overlying the right lung on the frontal projection this likely artifactual in nature and related to technique. No focal infiltrate identified. No pulmonary edema or pleural effusion. No pneumothorax.  Osseous structures are within normal limits.  IMPRESSION: No acute cardiopulmonary disease.   Electronically Signed   By: Rise Mu M.D.   On: 10/04/2013 02:01     Date: 10/04/2013  Rate: 86  Rhythm: normal sinus rhythm  QRS Axis: right  Intervals: normal  ST/T Wave abnormalities: normal  Conduction Disutrbances:none  Narrative Interpretation: Right axis deviation, poor R-wave progression. When compared with ECG of  03/23/2012, no significant changes are seen.  Old EKG Reviewed: unchanged   MDM   1. Dyspnea  Dyspnea of uncertain cause. She is currently therapeutically anticoagulated, so she would be at very low risk for pulmonary embolism. Apparent contusion of the right breast. Dyspnea could be from bronchospasm or could be hyperventilation. She will be given a therapeutic trial of albuterol.  Following albuterol, she is sleeping. When awakened, she denies any ongoing dyspnea. It is still unclear whether she had primary bronchospasm or whether she was hyperventilating. She will be discharged with a prescription for albuterol inhaler and is to followup with her PCP.  Dione Booze, MD 10/04/13 [phone]

## 2013-10-04 NOTE — ED Notes (Signed)
MD at bedside. 

## 2013-10-04 NOTE — ED Notes (Signed)
Per patient:  2 hours ago short of breath, than she started getting dizzy and started throwing up. 6 times, pt also has a headache.

## 2013-10-04 NOTE — Progress Notes (Signed)
Patient started on albuterol tx per MD order, pt states it is making her sick and took off mask, then proceeded to gag herself to throw up. RT will continue to monitor.

## 2013-10-14 ENCOUNTER — Emergency Department (HOSPITAL_COMMUNITY)
Admission: EM | Admit: 2013-10-14 | Discharge: 2013-10-14 | Discharge status: Against Medical Advice | Payer: BC Managed Care – PPO | Attending: Emergency Medicine | Admitting: Emergency Medicine

## 2013-10-14 DIAGNOSIS — IMO0002 Reserved for concepts with insufficient information to code with codable children: Secondary | ICD-10-CM | POA: Insufficient documentation

## 2013-10-14 DIAGNOSIS — F17200 Nicotine dependence, unspecified, uncomplicated: Secondary | ICD-10-CM | POA: Insufficient documentation

## 2013-10-14 DIAGNOSIS — Y9389 Activity, other specified: Secondary | ICD-10-CM | POA: Insufficient documentation

## 2013-10-14 DIAGNOSIS — Y92410 Unspecified street and highway as the place of occurrence of the external cause: Secondary | ICD-10-CM | POA: Insufficient documentation

## 2013-10-14 NOTE — ED Notes (Signed)
Not in waiting area x 2 

## 2013-10-14 NOTE — ED Notes (Signed)
Not in waiting room. Called x1
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
## 2013-10-15 ENCOUNTER — Emergency Department (HOSPITAL_COMMUNITY)
Admission: EM | Admit: 2013-10-15 | Discharge: 2013-10-15 | Disposition: A | Discharge status: Home / Self Care | Payer: BC Managed Care – PPO | Attending: Emergency Medicine | Admitting: Emergency Medicine

## 2013-10-15 ENCOUNTER — Encounter (HOSPITAL_COMMUNITY): Payer: Self-pay | Admitting: Emergency Medicine

## 2013-10-15 DIAGNOSIS — Y9389 Activity, other specified: Secondary | ICD-10-CM | POA: Insufficient documentation

## 2013-10-15 DIAGNOSIS — Z862 Personal history of diseases of the blood and blood-forming organs and certain disorders involving the immune mechanism: Secondary | ICD-10-CM | POA: Insufficient documentation

## 2013-10-15 DIAGNOSIS — IMO0002 Reserved for concepts with insufficient information to code with codable children: Secondary | ICD-10-CM | POA: Insufficient documentation

## 2013-10-15 DIAGNOSIS — Z7901 Long term (current) use of anticoagulants: Secondary | ICD-10-CM | POA: Insufficient documentation

## 2013-10-15 DIAGNOSIS — S0993XA Unspecified injury of face, initial encounter: Secondary | ICD-10-CM | POA: Insufficient documentation

## 2013-10-15 DIAGNOSIS — Y92410 Unspecified street and highway as the place of occurrence of the external cause: Secondary | ICD-10-CM | POA: Insufficient documentation

## 2013-10-15 DIAGNOSIS — F17200 Nicotine dependence, unspecified, uncomplicated: Secondary | ICD-10-CM | POA: Insufficient documentation

## 2013-10-15 DIAGNOSIS — Z8659 Personal history of other mental and behavioral disorders: Secondary | ICD-10-CM | POA: Insufficient documentation

## 2013-10-15 DIAGNOSIS — R55 Syncope and collapse: Secondary | ICD-10-CM | POA: Insufficient documentation

## 2013-10-15 DIAGNOSIS — Z79899 Other long term (current) drug therapy: Secondary | ICD-10-CM | POA: Insufficient documentation

## 2013-10-15 DIAGNOSIS — R209 Unspecified disturbances of skin sensation: Secondary | ICD-10-CM | POA: Insufficient documentation

## 2013-10-15 MED ORDER — OXYCODONE-ACETAMINOPHEN 10-325 MG PO TABS: 0.5000 | ORAL_TABLET | ORAL | Status: DC | PRN

## 2013-10-15 MED ORDER — METHOCARBAMOL 500 MG PO TABS
1000.0000 mg | ORAL_TABLET | Freq: Two times a day (BID) | ORAL | Status: DC
Start: 2013-10-15 — End: 2014-02-09
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
## 2013-10-17 NOTE — ED Provider Notes (Signed)
Medical screening examination/treatment/procedure(s) were performed by non-physician practitioner and as supervising physician I was immediately available for consultation/collaboration.  EKG Interpretation   None         Amaranta Mehl W. Shyanna Klingel, MD 10/17/13 0916 

## 2014-02-01 ENCOUNTER — Encounter (HOSPITAL_COMMUNITY): Payer: Self-pay | Admitting: Emergency Medicine

## 2014-02-01 ENCOUNTER — Emergency Department (INDEPENDENT_AMBULATORY_CARE_PROVIDER_SITE_OTHER)
Admission: EM | Admit: 2014-02-01 | Discharge: 2014-02-01 | Disposition: A | Discharge status: Home / Self Care | Payer: BC Managed Care – PPO | Source: Home / Self Care | Attending: Family Medicine | Admitting: Family Medicine

## 2014-02-01 DIAGNOSIS — L02412 Cutaneous abscess of left axilla: Secondary | ICD-10-CM

## 2014-02-01 DIAGNOSIS — Z4800 Encounter for change or removal of nonsurgical wound dressing: Secondary | ICD-10-CM

## 2014-02-01 DIAGNOSIS — IMO0002 Reserved for concepts with insufficient information to code with codable children: Secondary | ICD-10-CM

## 2014-02-01 HISTORY — DX: Chronic embolism and thrombosis of deep veins of unspecified upper extremity: I82.729

## 2014-02-01 MED ORDER — MINOCYCLINE HCL 100 MG PO CAPS: 100.0000 mg | ORAL_CAPSULE | Freq: Two times a day (BID) | ORAL | Status: DC

## 2014-02-01 MED ORDER — HYDROCODONE-ACETAMINOPHEN 5-325 MG PO TABS: 1.0000 | ORAL_TABLET | ORAL | Status: DC | PRN

## 2014-02-01 NOTE — ED Notes (Signed)
C/O abscess to left axilla x3 days.  Has had once in past.  Denies fevers, but c/o feeling sluggish.  Has not taken HTN med

## 2014-02-01 NOTE — Discharge Instructions (Signed)
Warm compress twice a day when you take the antibiotic, take all of medicine, return on tues or wed as discussed.

## 2014-02-01 NOTE — ED Provider Notes (Signed)
CSN: [id number]     Arrival date & time 02/01/14  [id number] History   First MD Initiated Contact with Patient 02/01/14 [phone]     Chief Complaint  Patient presents with  . Abscess     (Consider location/radiation/quality/duration/timing/severity/associated sxs/prior Treatment) Patient is a 33 y.o. female presenting with abscess. The history is provided by the patient.  Abscess Location:  Shoulder/arm Shoulder/arm abscess location:  L axilla Abscess quality: fluctuance, painful, redness and warmth   Red streaking: no   Duration:  4 days Progression:  Worsening Pain details:    Quality:  Throbbing   Severity:  Moderate Chronicity:  Recurrent Risk factors: prior abscess     Past Medical History  Diagnosis Date  . Anxiety   . Sickle cell anemia     sickle cell trait  . Arm DVT (deep venous thromboembolism), chronic     RUE  . Hypertension    History reviewed. No pertinent past surgical history. No family history on file. History  Substance Use Topics  . Smoking status: Former Smoker -- 0.50 packs/day for 10 years    Types: Cigarettes  . Smokeless tobacco: Not on file  . Alcohol Use: Yes     Comment: occasionally   OB History   Grav Para Term Preterm Abortions TAB SAB Ect Mult Living                 Review of Systems  Skin: Positive for rash.      Allergies  Review of patient's allergies indicates no known allergies.  Home Medications   Current Outpatient Rx  Name  Route  Sig  Dispense  Refill  . lisinopril (PRINIVIL,ZESTRIL) 10 MG tablet   Oral   Take 10 mg by mouth daily.         . Rivaroxaban (XARELTO) 20 MG TABS tablet   Oral   Take 20 mg by mouth daily.         Marland Kitchen. albuterol (PROVENTIL HFA;VENTOLIN HFA) 108 (90 BASE) MCG/ACT inhaler   Inhalation   Inhale 2 puffs into the lungs every 2 (two) hours as needed for wheezing or shortness of breath (cough).   1 Inhaler   0   . HYDROcodone-acetaminophen (NORCO/VICODIN) 5-325 MG per tablet   Oral   Take  1 tablet by mouth every 4 (four) hours as needed.   10 tablet   0   . methocarbamol (ROBAXIN) 500 MG tablet   Oral   Take 2 tablets (1,000 mg total) by mouth 2 (two) times daily.   20 tablet   0   . minocycline (MINOCIN,DYNACIN) 100 MG capsule   Oral   Take 1 capsule (100 mg total) by mouth 2 (two) times daily.   14 capsule   0   . oxyCODONE-acetaminophen (PERCOCET) 10-325 MG per tablet   Oral   Take 0.5-1 tablets by mouth every 4 (four) hours as needed for pain.   15 tablet   0    There were no vitals taken for this visit. Physical Exam  Nursing note and vitals reviewed. Constitutional: She is oriented to person, place, and time. She appears well-developed and well-nourished.  Neurological: She is alert and oriented to person, place, and time.  Skin: Skin is warm and dry.  3cm tender fluctuant abscess to left axilla    ED Course  INCISION AND DRAINAGE Date/Time: 02/01/2014 9:45 AM Performed by: Linna HoffKINDL, JAMES D Authorized by: Bradd CanaryKINDL, JAMES D Consent: Verbal consent obtained. Risks and benefits: risks, benefits and  alternatives were discussed Consent given by: patient Type: abscess Body area: upper extremity Location details: left shoulder Local anesthetic: topical anesthetic Patient sedated: no Scalpel size: 11 Incision type: single straight Complexity: simple Drainage: purulent Drainage amount: moderate Wound treatment: wound left open Packing material: 1/4 in iodoform gauze Patient tolerance: Patient tolerated the procedure well with no immediate complications. Comments: Culture obtained.   (including critical care time) Labs Review Labs Reviewed  CULTURE, ROUTINE-ABSCESS   Imaging Review No results found.    MDM   Final diagnoses:  Abscess of left axilla        Linna Hoff, MD 02/01/14 [phone]

## 2014-02-05 ENCOUNTER — Emergency Department (INDEPENDENT_AMBULATORY_CARE_PROVIDER_SITE_OTHER)
Admission: EM | Admit: 2014-02-05 | Discharge: 2014-02-05 | Disposition: A | Discharge status: Home / Self Care | Payer: BC Managed Care – PPO | Source: Home / Self Care | Attending: Emergency Medicine | Admitting: Emergency Medicine

## 2014-02-05 ENCOUNTER — Encounter (HOSPITAL_COMMUNITY): Payer: Self-pay | Admitting: Emergency Medicine

## 2014-02-05 DIAGNOSIS — Z5189 Encounter for other specified aftercare: Secondary | ICD-10-CM

## 2014-02-05 DIAGNOSIS — Z4800 Encounter for change or removal of nonsurgical wound dressing: Secondary | ICD-10-CM

## 2014-02-05 LAB — CULTURE, ROUTINE-ABSCESS: Special Requests: NORMAL

## 2014-02-05 NOTE — Discharge Instructions (Signed)
Finish all of the antibiotics, even if you are feeling better.  Consider getting chlorhexidine soap from the pharmacy and washing with it for a week.  Also consider trying probiotics for about a month.

## 2014-02-05 NOTE — ED Provider Notes (Signed)
CSN: [id number]     Arrival date & time 02/05/14  1008 History   First MD Initiated Contact with Patient 02/05/14 1055     Chief Complaint  Patient presents with  . Suture / Staple Removal     (Consider location/radiation/quality/duration/timing/severity/associated sxs/prior Treatment) Patient is a 33 y.o. female presenting with wound check. The history is provided by the patient.  Wound Check This is a new problem. Episode onset: i&d 4 days ago. The problem occurs constantly. The problem has been gradually improving. Exacerbated by: some drainage and tenderness. Relieved by: taking antibiotics, feels is getting better. The treatment provided moderate relief.    Past Medical History  Diagnosis Date  . Anxiety   . Sickle cell anemia     sickle cell trait  . Arm DVT (deep venous thromboembolism), chronic     RUE  . Hypertension    History reviewed. No pertinent past surgical history. History reviewed. No pertinent family history. History  Substance Use Topics  . Smoking status: Former Smoker -- 0.50 packs/day for 10 years    Types: Cigarettes  . Smokeless tobacco: Not on file  . Alcohol Use: Yes     Comment: occasionally   OB History   Grav Para Term Preterm Abortions TAB SAB Ect Mult Living                 Review of Systems  Constitutional: Negative for fever and chills.  Skin: Positive for wound.      Allergies  Review of patient's allergies indicates no known allergies.  Home Medications   Current Outpatient Rx  Name  Route  Sig  Dispense  Refill  . albuterol (PROVENTIL HFA;VENTOLIN HFA) 108 (90 BASE) MCG/ACT inhaler   Inhalation   Inhale 2 puffs into the lungs every 2 (two) hours as needed for wheezing or shortness of breath (cough).   1 Inhaler   0   . HYDROcodone-acetaminophen (NORCO/VICODIN) 5-325 MG per tablet   Oral   Take 1 tablet by mouth every 4 (four) hours as needed.   10 tablet   0   . lisinopril (PRINIVIL,ZESTRIL) 10 MG tablet   Oral  Take 10 mg by mouth daily.         . methocarbamol (ROBAXIN) 500 MG tablet   Oral   Take 2 tablets (1,000 mg total) by mouth 2 (two) times daily.   20 tablet   0   . minocycline (MINOCIN,DYNACIN) 100 MG capsule   Oral   Take 1 capsule (100 mg total) by mouth 2 (two) times daily.   14 capsule   0   . oxyCODONE-acetaminophen (PERCOCET) 10-325 MG per tablet   Oral   Take 0.5-1 tablets by mouth every 4 (four) hours as needed for pain.   15 tablet   0   . Rivaroxaban (XARELTO) 20 MG TABS tablet   Oral   Take 20 mg by mouth daily.          BP 170/117  Pulse 82  Temp(Src) 98.9 F (37.2 C) (Oral)  Resp 18  SpO2 100%  LMP 01/06/2014 Physical Exam  Constitutional: She appears well-developed and well-nourished. No distress.  Skin: Skin is warm and dry. No erythema.  Incision L axilla with iodoform gauze packing. Packing removed and replaced as wound is still large. Minimally tender to palp. No drainage noted.     ED Course  Procedures (including critical care time) Labs Review Labs Reviewed - No data to display Imaging Review No results  found.    MDM   Final diagnoses:  Wound check, abscess  wound repacked. Pt to remove by self in 2-3 days. Finish antibiotics. F/u if not improving or worsening. This is pt's 2nd abscess. Suggested using chlorhexidine and probiotics.       Cathlyn Parsons, NP 02/05/14 1102

## 2014-02-05 NOTE — ED Notes (Signed)
Follow up on abscess on axilla area

## 2014-02-05 NOTE — ED Provider Notes (Signed)
Medical screening examination/treatment/procedure(s) were performed by non-physician practitioner and as supervising physician I was immediately available for consultation/collaboration.  Gibril Mastro, M.D.  [place] Hanlon C Chauncey Sciulli, MD 02/05/14 1143 

## 2014-02-05 NOTE — ED Notes (Signed)
Abscess culture L arm: Few Proteus Mirabilis.  Pt. treated with Minocycline- not on sensitivity.  Message sent to Dr. Artis FlockKindl. Vassie MoselleYork, [facility] M 02/05/2014

## 2014-02-06 ENCOUNTER — Telehealth (HOSPITAL_COMMUNITY): Payer: Self-pay

## 2014-02-06 NOTE — ED Notes (Signed)
Lab shown to Dr. Artis FlockKindl.  He said to call for clinical improvement.  If not better Amoxicillin 500 mg.  #21 1 TID.  I called pt. Pt. verified x 2 and given result.  Pt. said she came yesterday and had it repacked and was told to finish all of her antibiotics. Discussed with Dr. Artis FlockKindl, and he said she should start the Amoxicillin.  I told pt. this. She asked if she should stop the other antibiotic and I told her no.  She asked if it would affect her BP medicine or her Xarelto. I told her I did not think so, but to ask her pharmacist to be sure. [facility] [address] Rx. called to pharmacist @ [phone]. Vassie MoselleYork, Chestina Komatsu M 02/06/2014

## 2014-02-09 ENCOUNTER — Emergency Department (HOSPITAL_COMMUNITY)
Admission: EM | Admit: 2014-02-09 | Discharge: 2014-02-10 | Disposition: A | Discharge status: Home / Self Care | Payer: BC Managed Care – PPO | Attending: Emergency Medicine | Admitting: Emergency Medicine

## 2014-02-09 ENCOUNTER — Encounter (HOSPITAL_COMMUNITY): Payer: Self-pay | Admitting: Emergency Medicine

## 2014-02-09 ENCOUNTER — Emergency Department (HOSPITAL_COMMUNITY): Payer: BC Managed Care – PPO

## 2014-02-09 DIAGNOSIS — Z87891 Personal history of nicotine dependence: Secondary | ICD-10-CM | POA: Insufficient documentation

## 2014-02-09 DIAGNOSIS — I10 Essential (primary) hypertension: Secondary | ICD-10-CM

## 2014-02-09 DIAGNOSIS — Z8659 Personal history of other mental and behavioral disorders: Secondary | ICD-10-CM | POA: Insufficient documentation

## 2014-02-09 DIAGNOSIS — R110 Nausea: Secondary | ICD-10-CM | POA: Insufficient documentation

## 2014-02-09 DIAGNOSIS — Z79899 Other long term (current) drug therapy: Secondary | ICD-10-CM | POA: Insufficient documentation

## 2014-02-09 DIAGNOSIS — Z862 Personal history of diseases of the blood and blood-forming organs and certain disorders involving the immune mechanism: Secondary | ICD-10-CM | POA: Insufficient documentation

## 2014-02-09 DIAGNOSIS — Z86718 Personal history of other venous thrombosis and embolism: Secondary | ICD-10-CM | POA: Insufficient documentation

## 2014-02-09 DIAGNOSIS — R51 Headache: Secondary | ICD-10-CM | POA: Insufficient documentation

## 2014-02-09 DIAGNOSIS — Z7901 Long term (current) use of anticoagulants: Secondary | ICD-10-CM | POA: Insufficient documentation

## 2014-02-09 LAB — BASIC METABOLIC PANEL
BUN: 13 mg/dL (ref 6–23)
CALCIUM: 9.4 mg/dL (ref 8.4–10.5)
CO2: 25 meq/L (ref 19–32)
Chloride: 100 mEq/L (ref 96–112)
Creatinine, Ser: 0.74 mg/dL (ref 0.50–1.10)
GFR calc Af Amer: >90 mL/min (ref >90)
GFR calc non Af Amer: >90 mL/min (ref >90)
Glucose, Bld: 84 mg/dL (ref 70–99)
POTASSIUM: 3.7 meq/L (ref 3.7–5.3)
SODIUM: 138 meq/L (ref 137–147)

## 2014-02-09 LAB — CBC
HCT: 38.8 % (ref 36.0–46.0)
HEMOGLOBIN: 14.2 g/dL (ref 12.0–15.0)
MCH: 29.4 pg (ref 26.0–34.0)
MCHC: 36.6 g/dL — ABNORMAL HIGH (ref 30.0–36.0)
MCV: 80.3 fL (ref 78.0–100.0)
PLATELETS: 266 10*3/uL (ref 150–400)
RBC: 4.83 MIL/uL (ref 3.87–5.11)
RDW: 13.3 % (ref 11.5–15.5)
WBC: 11.1 10*3/uL — AB (ref 4.0–10.5)

## 2014-02-09 LAB — I-STAT TROPONIN, ED: Troponin i, poc: 0.01 ng/mL (ref 0.00–0.08)

## 2014-02-09 MED ORDER — LISINOPRIL 20 MG PO TABS
20.0000 mg | ORAL_TABLET | Freq: Once | ORAL | Status: AC
  Administered 2014-02-09: 20 mg via ORAL
  Filled 2014-02-09: qty 1

## 2014-02-09 MED ORDER — ONDANSETRON 4 MG PO TBDP
4.0000 mg | ORAL_TABLET | Freq: Once | ORAL | Status: AC
  Administered 2014-02-09: 4 mg via ORAL
  Filled 2014-02-09: qty 1

## 2014-02-09 NOTE — ED Notes (Signed)
PT here for headache, SOB, and HTN. States has had HTN for 3 weeks now, headache is making her feel nauseated.

## 2014-02-09 NOTE — ED Notes (Signed)
Pt reports that she called EMS today and they came out to check her BP and noticed that it was high. States that she has also had a headache off and on for the past 3 days. Denies any chest pain or SOB. States that she took her BP medication today.

## 2014-02-09 NOTE — ED Provider Notes (Signed)
CSN: [id number]     Arrival date & time 02/09/14  1616 History   First MD Initiated Contact with Patient 02/09/14 2257     Chief Complaint  Patient presents with  . Hypertension  . Headache     (Consider location/radiation/quality/duration/timing/severity/associated sxs/prior Treatment) HPI Comments: 33 year old female with a history of hypertension and deep venous thrombosis which was diagnosed in August of 2014. Since that time she has been on anticoagulation as well as lisinopril but has not checked her blood pressure frequently. Over last several days she has been checking her blood pressure, she has a followup appointment at 10:00 in the morning tomorrow but because her blood pressure was elevated this evening she called the doctor's office. They told her they thought she needed to be seen immediately that she might have a stroke with high blood pressure prompting her to come to the emergency department. She denies any symptoms such as chest pain, headache, blurred vision, weakness, numbness, difficulty speaking walking or swallowing. She has no chest pain, she has been having mild shortness of breath over the last couple of days. She denies missing any doses of her medication, she endorses complete compliance with both her antihypertensive as well as her anticoagulants. Blood pressure was as high as 180/110 prior to arrival  Patient is a 33 y.o. female presenting with hypertension and headaches. The history is provided by the patient.  Hypertension Associated symptoms include headaches.  Headache   Past Medical History  Diagnosis Date  . Anxiety   . Sickle cell anemia     sickle cell trait  . Arm DVT (deep venous thromboembolism), chronic     RUE  . Hypertension    History reviewed. No pertinent past surgical history. History reviewed. No pertinent family history. History  Substance Use Topics  . Smoking status: Former Smoker -- 0.50 packs/day for 10 years    Types: Cigarettes   . Smokeless tobacco: Not on file  . Alcohol Use: Yes     Comment: occasionally   OB History   Grav Para Term Preterm Abortions TAB SAB Ect Mult Living                 Review of Systems  Neurological: Positive for headaches.  All other systems reviewed and are negative.      Allergies  Review of patient's allergies indicates no known allergies.  Home Medications   Current Outpatient Rx  Name  Route  Sig  Dispense  Refill  . albuterol (PROVENTIL HFA;VENTOLIN HFA) 108 (90 BASE) MCG/ACT inhaler   Inhalation   Inhale 1-2 puffs into the lungs every 6 (six) hours as needed for wheezing or shortness of breath.         Marland Kitchen. HYDROcodone-acetaminophen (NORCO/VICODIN) 5-325 MG per tablet   Oral   Take 1 tablet by mouth every 6 (six) hours as needed for moderate pain.         Marland Kitchen. lisinopril (PRINIVIL,ZESTRIL) 10 MG tablet   Oral   Take 10 mg by mouth daily.         . minocycline (MINOCIN,DYNACIN) 100 MG capsule   Oral   Take 100 mg by mouth 2 (two) times daily.         . Rivaroxaban (XARELTO) 20 MG TABS tablet   Oral   Take 20 mg by mouth daily with supper.          BP 186/109  Pulse 75  Temp(Src) 97.7 F (36.5 C) (Oral)  Resp 18  Ht 5\' 6"  (1.676 m)  Wt 158 lb (71.668 kg)  BMI 25.51 kg/m2  SpO2 97%  LMP 01/06/2014 Physical Exam  Nursing note and vitals reviewed. Constitutional: She appears well-developed and well-nourished. No distress.  HENT:  Head: Normocephalic and atraumatic.  Mouth/Throat: Oropharynx is clear and moist. No oropharyngeal exudate.  Eyes: Conjunctivae and EOM are normal. Pupils are equal, round, and reactive to light. Right eye exhibits no discharge. Left eye exhibits no discharge. No scleral icterus.  Neck: Normal range of motion. Neck supple. No JVD present. No thyromegaly present.  Cardiovascular: Normal rate, regular rhythm, normal heart sounds and intact distal pulses.  Exam reveals no gallop and no friction rub.   No murmur  heard. Pulmonary/Chest: Effort normal and breath sounds normal. No respiratory distress. She has no wheezes. She has no rales.  Abdominal: Soft. Bowel sounds are normal. She exhibits no distension and no mass. There is no tenderness.  Musculoskeletal: Normal range of motion. She exhibits no edema and no tenderness.  Lymphadenopathy:    She has no cervical adenopathy.  Neurological: She is alert. Coordination normal.  Speech is clear, cranial nerves III through XII are intact, memory is intact, strength is normal in all 4 extremities including grips, sensation is intact to light touch and pinprick in all 4 extremities. Coordination as tested by finger-nose-finger is normal, no limb ataxia. Normal gait, normal reflexes at the patellar tendons bilaterally  Skin: Skin is warm and dry. No rash noted. No erythema.  Psychiatric: She has a normal mood and affect. Her behavior is normal.    ED Course  Procedures (including critical care time) Labs Review Labs Reviewed  CBC - Abnormal; Notable for the following:    WBC 11.1 (*)    MCHC 36.6 (*)    All other components within normal limits  BASIC METABOLIC PANEL  I-STAT TROPOININ, ED   Imaging Review No results found.  EKG Interpretation   None       MDM   Final diagnoses:  None    The EKG is overall unremarkable, no signs of acute ischemia, metabolic panel without renal dysfunction or electrolyte abnormalities and blood counts essentially unremarkable as well.  Lisinopril 20mg  to be given, zofran for mild nausea at this time, check CXR as pt c/o intermittent mild sob when she gets anxious and upset over her BP - she has been tearful throughout the encounter citing her worry re: her elevated BP and that she may stroke b/c of it as the source of her anxiety.  BP much improved - now back to normal range - will have keep lisinopril at 20mg  PO daily, pt in agreement.  CXR without cardiomegaly or pulmonary edema.  Meds given in  ED:  Medications  lisinopril (PRINIVIL,ZESTRIL) tablet 20 mg (20 mg Oral Given 02/09/14 2321)  ondansetron (ZOFRAN-ODT) disintegrating tablet 4 mg (4 mg Oral Given 02/09/14 2321)    Discharge Medication List as of 02/10/2014  1:40 AM          Vida Roller, MD 02/10/14 [phone]

## 2014-02-10 MED ORDER — LISINOPRIL 20 MG PO TABS: 20.0000 mg | ORAL_TABLET | Freq: Every day | ORAL | Status: DC

## 2014-02-10 NOTE — Discharge Instructions (Signed)
Start taking 20mg  of lisinopril daily - have your blood pressure rechecked in 1-2 weeks.  Your xray and testing otherwise looks normal  Please call your doctor for a followup appointment within 24-48 hours. When you talk to your doctor please let them know that you were seen in the emergency department and have them acquire all of your records so that they can discuss the findings with you and formulate a treatment plan to fully care for your new and ongoing problems.

## 2014-02-10 NOTE — ED Notes (Signed)
Last vital signs taken, pt being discharged.

## 2014-02-23 ENCOUNTER — Emergency Department (HOSPITAL_COMMUNITY)
Admission: EM | Admit: 2014-02-23 | Discharge: 2014-02-23 | Disposition: A | Discharge status: Home / Self Care | Payer: BC Managed Care – PPO | Attending: Emergency Medicine | Admitting: Emergency Medicine

## 2014-02-23 ENCOUNTER — Emergency Department (INDEPENDENT_AMBULATORY_CARE_PROVIDER_SITE_OTHER)
Admission: EM | Admit: 2014-02-23 | Discharge: 2014-02-23 | Disposition: A | Discharge status: Nursing Home (Includes ICF) | Payer: BC Managed Care – PPO | Source: Home / Self Care | Attending: Family Medicine | Admitting: Family Medicine

## 2014-02-23 ENCOUNTER — Encounter (HOSPITAL_COMMUNITY): Payer: Self-pay | Admitting: Emergency Medicine

## 2014-02-23 DIAGNOSIS — Z862 Personal history of diseases of the blood and blood-forming organs and certain disorders involving the immune mechanism: Secondary | ICD-10-CM | POA: Insufficient documentation

## 2014-02-23 DIAGNOSIS — I10 Essential (primary) hypertension: Secondary | ICD-10-CM | POA: Insufficient documentation

## 2014-02-23 DIAGNOSIS — Z79899 Other long term (current) drug therapy: Secondary | ICD-10-CM | POA: Insufficient documentation

## 2014-02-23 DIAGNOSIS — F411 Generalized anxiety disorder: Secondary | ICD-10-CM | POA: Insufficient documentation

## 2014-02-23 DIAGNOSIS — IMO0002 Reserved for concepts with insufficient information to code with codable children: Secondary | ICD-10-CM | POA: Insufficient documentation

## 2014-02-23 DIAGNOSIS — I160 Hypertensive urgency: Secondary | ICD-10-CM

## 2014-02-23 DIAGNOSIS — T465X5A Adverse effect of other antihypertensive drugs, initial encounter: Secondary | ICD-10-CM | POA: Insufficient documentation

## 2014-02-23 DIAGNOSIS — T7840XA Allergy, unspecified, initial encounter: Secondary | ICD-10-CM

## 2014-02-23 DIAGNOSIS — T783XXA Angioneurotic edema, initial encounter: Secondary | ICD-10-CM

## 2014-02-23 DIAGNOSIS — L500 Allergic urticaria: Secondary | ICD-10-CM | POA: Insufficient documentation

## 2014-02-23 DIAGNOSIS — Z86718 Personal history of other venous thrombosis and embolism: Secondary | ICD-10-CM | POA: Insufficient documentation

## 2014-02-23 DIAGNOSIS — Z87891 Personal history of nicotine dependence: Secondary | ICD-10-CM | POA: Insufficient documentation

## 2014-02-23 MED ORDER — DIPHENHYDRAMINE HCL 50 MG/ML IJ SOLN
INTRAMUSCULAR | Status: AC
  Filled 2014-02-23: qty 1

## 2014-02-23 MED ORDER — DIPHENHYDRAMINE HCL 50 MG/ML IJ SOLN
50.0000 mg | Freq: Once | INTRAMUSCULAR | Status: AC
  Administered 2014-02-23: 50 mg via INTRAMUSCULAR

## 2014-02-23 MED ORDER — DIPHENHYDRAMINE HCL 25 MG PO CAPS: 25.0000 mg | ORAL_CAPSULE | Freq: Four times a day (QID) | ORAL | Status: DC | PRN

## 2014-02-23 MED ORDER — PREDNISONE 50 MG PO TABS
50.0000 mg | ORAL_TABLET | Freq: Every day | ORAL | Status: DC
Start: 2014-02-23 — End: 2015-07-29

## 2014-02-23 MED ORDER — METHYLPREDNISOLONE SODIUM SUCC 125 MG IJ SOLR
125.0000 mg | Freq: Once | INTRAMUSCULAR | Status: AC
  Administered 2014-02-23: 125 mg via INTRAVENOUS

## 2014-02-23 MED ORDER — SODIUM CHLORIDE 0.9 % IV SOLN
Freq: Once | INTRAVENOUS | Status: AC
  Administered 2014-02-23: 20:00:00 via INTRAVENOUS

## 2014-02-23 MED ORDER — METHYLPREDNISOLONE SODIUM SUCC 125 MG IJ SOLR
INTRAMUSCULAR | Status: AC
  Filled 2014-02-23: qty 2

## 2014-02-23 MED ORDER — EPINEPHRINE HCL 1 MG/ML IJ SOLN
0.3000 mg | Freq: Once | INTRAMUSCULAR | Status: AC
  Administered 2014-02-23: 0.3 mg via INTRAMUSCULAR

## 2014-02-23 MED ORDER — PREDNISONE 20 MG PO TABS
60.0000 mg | ORAL_TABLET | Freq: Once | ORAL | Status: AC
  Administered 2014-02-23: 60 mg via ORAL
  Filled 2014-02-23: qty 3

## 2014-02-23 MED ORDER — EPINEPHRINE HCL 1 MG/ML IJ SOLN
INTRAMUSCULAR | Status: AC
  Filled 2014-02-23: qty 1

## 2014-02-23 MED ORDER — DIPHENHYDRAMINE HCL 25 MG PO CAPS
25.0000 mg | ORAL_CAPSULE | Freq: Once | ORAL | Status: AC
  Administered 2014-02-23: 25 mg via ORAL
  Filled 2014-02-23: qty 1

## 2014-02-23 MED ORDER — RANITIDINE HCL 150 MG PO TABS
150.0000 mg | ORAL_TABLET | Freq: Two times a day (BID) | ORAL | Status: DC
Start: 2014-02-23 — End: 2015-07-29

## 2014-02-23 NOTE — ED Notes (Signed)
[facility]

## 2014-02-23 NOTE — ED Notes (Signed)
Patient reports onset of redness, itching, hives on right arm, similar areas are starting on left arm and patient starting to scratch back.  This started around 6:30pm.  Reports some hoarseness.  Denies any difficulty breathing, no tongue swelling.    Patient ate subways-turkey sandwich around 4:30pm.

## 2014-02-23 NOTE — ED Provider Notes (Signed)
CSN: [id number]     Arrival date & time 02/23/14  1913 History   First MD Initiated Contact with Patient 02/23/14 1947     Chief Complaint  Patient presents with  . Urticaria  . Pruritis   (Consider location/radiation/quality/duration/timing/severity/associated sxs/prior Treatment)  HPI  The patient presents tonight with complaints of itching, rash, voice changes and difficulty swallowing.  Onset this evening immediately prior to arrival.  Past Medical History  Diagnosis Date  . Anxiety   . Sickle cell anemia     sickle cell trait  . Arm DVT (deep venous thromboembolism), chronic     RUE  . Hypertension    History reviewed. No pertinent past surgical history. No family history on file. History  Substance Use Topics  . Smoking status: Former Smoker -- 0.50 packs/day for 10 years    Types: Cigarettes  . Smokeless tobacco: Not on file  . Alcohol Use: Yes     Comment: occasionally   OB History   Grav Para Term Preterm Abortions TAB SAB Ect Mult Living                 Review of Systems  Constitutional: Negative.  Negative for fever, chills and fatigue.  HENT: Positive for trouble swallowing and voice change. Negative for congestion, drooling, facial swelling and sore throat.        Patient reports difficulty swallowing as though there is a "lump in my throat".  Eyes: Negative.   Respiratory: Negative for cough, choking, chest tightness, shortness of breath and wheezing.   Cardiovascular: Negative.  Negative for chest pain.  Gastrointestinal: Negative.   Endocrine: Negative.   Genitourinary: Negative.   Musculoskeletal: Negative.   Skin: Negative.   Allergic/Immunologic: Negative.  Negative for environmental allergies, food allergies and immunocompromised state.  Neurological: Negative.   Hematological: Negative.   Psychiatric/Behavioral: Negative.     Allergies  Review of patient's allergies indicates no known allergies.  Home Medications   Current Outpatient Rx   Name  Route  Sig  Dispense  Refill  . albuterol (PROVENTIL HFA;VENTOLIN HFA) 108 (90 BASE) MCG/ACT inhaler   Inhalation   Inhale 1-2 puffs into the lungs every 6 (six) hours as needed for wheezing or shortness of breath.         Marland Kitchen HYDROcodone-acetaminophen (NORCO/VICODIN) 5-325 MG per tablet   Oral   Take 1 tablet by mouth every 6 (six) hours as needed for moderate pain.         Marland Kitchen lisinopril (PRINIVIL,ZESTRIL) 20 MG tablet   Oral   Take 1 tablet (20 mg total) by mouth daily.   30 tablet   1   . minocycline (MINOCIN,DYNACIN) 100 MG capsule   Oral   Take 100 mg by mouth 2 (two) times daily.         . Rivaroxaban (XARELTO) 20 MG TABS tablet   Oral   Take 20 mg by mouth daily with supper.          LMP 02/06/2014  Physical Exam  Nursing note and vitals reviewed. Constitutional: She is oriented to person, place, and time. She appears well-developed and well-nourished. No distress.  HENT:  Head: Normocephalic and atraumatic.  Eyes: Pupils are equal, round, and reactive to light.  Neck: Normal range of motion. Neck supple. No tracheal deviation present.  Cardiovascular: Normal rate, regular rhythm, normal heart sounds and intact distal pulses.  Exam reveals no gallop and no friction rub.   No murmur heard. Pulmonary/Chest: Effort normal and  breath sounds normal. No stridor. No respiratory distress. She has no wheezes. She has no rales. She exhibits no tenderness.  Lymphadenopathy:    She has no cervical adenopathy.  Neurological: She is alert and oriented to person, place, and time.  Skin: Skin is warm and dry. Rash noted. She is not diaphoretic. There is erythema. No pallor.  Patient has wheals on bilateral upper arms, chest and lower back. Reports pruritis at 5/10 on scale.     There is no evidence of swelling in the oral cavity or of lips and tongue.  ED Course  Procedures (including critical care time) Labs Review Labs Reviewed - No data to display Imaging  Review No results found.   MDM   1. Acute allergic reaction    Meds ordered this encounter  Medications  . EPINEPHrine (ADRENALIN) injection 0.3 mg    Sig:   . diphenhydrAMINE (BENADRYL) injection 50 mg    Sig:   . methylPREDNISolone sodium succinate (SOLU-MEDROL) 125 mg/2 mL injection 125 mg    Sig:   . 0.9 %  sodium chloride infusion    Sig:    Dr. Artis FlockKindl consulted on plan of care.  The patient verbalizes understanding and agrees to plan of care.  [facility] for further evaluation and monitoring.    Weber Cooksatherine Rossi, NP 02/23/14 2059

## 2014-02-23 NOTE — Discharge Instructions (Signed)
We saw you in the ER after you had the allergic reaction.  The reaction is severe, however, it appears to be in control and there is no increased swelling or any difficulty in breathing noted. We are not sure what caused the reaction, and it is important for you to follow up with your primary care doctor for possible allergic workup. LISINOPRIL CAN CAUSE SIMILAR SYMPTOMS  - AND YOU MUST STOP TAKING THE MEDICINE FOR NOW. Please take the medications prescribed. PLEASE RETURN TO THE ER IMMEDIATELY IN CASE YOU START HAVING WORSENING SWELLING, DIFFICULTY IN BREATHING ETC.   Allergies Allergies may happen from anything your body is sensitive to. This may be food, medicines, pollens, chemicals, and nearly anything around you in everyday life that produces allergens. An allergen is anything that causes an allergy producing substance. Heredity is often a factor in causing these problems. This means you may have some of the same allergies as your parents. Food allergies happen in all age groups. Food allergies are some of the most severe and life threatening. Some common food allergies are cow's milk, seafood, eggs, nuts, wheat, and soybeans. SYMPTOMS   Swelling around the mouth.  An itchy red rash or hives.  Vomiting or diarrhea.  Difficulty breathing. SEVERE ALLERGIC REACTIONS ARE LIFE-THREATENING. This reaction is called anaphylaxis. It can cause the mouth and throat to swell and cause difficulty with breathing and swallowing. In severe reactions only a trace amount of food (for example, peanut oil in a salad) may cause death within seconds. Seasonal allergies occur in all age groups. These are seasonal because they usually occur during the same season every year. They may be a reaction to molds, grass pollens, or tree pollens. Other causes of problems are house dust mite allergens, pet dander, and mold spores. The symptoms often consist of nasal congestion, a runny itchy nose associated with  sneezing, and tearing itchy eyes. There is often an associated itching of the mouth and ears. The problems happen when you come in contact with pollens and other allergens. Allergens are the particles in the air that the body reacts to with an allergic reaction. This causes you to release allergic antibodies. Through a chain of events, these eventually cause you to release histamine into the blood stream. Although it is meant to be protective to the body, it is this release that causes your discomfort. This is why you were given anti-histamines to feel better. If you are unable to pinpoint the offending allergen, it may be determined by skin or blood testing. Allergies cannot be cured but can be controlled with medicine. Hay fever is a collection of all or some of the seasonal allergy problems. It may often be treated with simple over-the-counter medicine such as diphenhydramine. Take medicine as directed. Do not drink alcohol or drive while taking this medicine. Check with your caregiver or package insert for child dosages. If these medicines are not effective, there are many new medicines your caregiver can prescribe. Stronger medicine such as nasal spray, eye drops, and corticosteroids may be used if the first things you try do not work well. Other treatments such as immunotherapy or desensitizing injections can be used if all else fails. Follow up with your caregiver if problems continue. These seasonal allergies are usually not life threatening. They are generally more of a nuisance that can often be handled using medicine. HOME CARE INSTRUCTIONS   If unsure what causes a reaction, keep a diary of foods eaten and symptoms that follow.  Avoid foods that cause reactions.  If hives or rash are present:  Take medicine as directed.  You may use an over-the-counter antihistamine (diphenhydramine) for hives and itching as needed.  Apply cold compresses (cloths) to the skin or take baths in cool water.  Avoid hot baths or showers. Heat will make a rash and itching worse.  If you are severely allergic:  Following a treatment for a severe reaction, hospitalization is often required for closer follow-up.  Wear a medic-alert bracelet or necklace stating the allergy.  You and your family must learn how to give adrenaline or use an anaphylaxis kit.  If you have had a severe reaction, always carry your anaphylaxis kit or EpiPen with you. Use this medicine as directed by your caregiver if a severe reaction is occurring. Failure to do so could have a fatal outcome. SEEK MEDICAL CARE IF:  You suspect a food allergy. Symptoms generally happen within 30 minutes of eating a food.  Your symptoms have not gone away within 2 days or are getting worse.  You develop new symptoms.  You want to retest yourself or your child with a food or drink you think causes an allergic reaction. Never do this if an anaphylactic reaction to that food or drink has happened before. Only do this under the care of a caregiver. SEEK IMMEDIATE MEDICAL CARE IF:   You have difficulty breathing, are wheezing, or have a tight feeling in your chest or throat.  You have a swollen mouth, or you have hives, swelling, or itching all over your body.  You have had a severe reaction that has responded to your anaphylaxis kit or an EpiPen. These reactions may return when the medicine has worn off. These reactions should be considered life threatening. MAKE SURE YOU:   Understand these instructions.  Will watch your condition.  Will get help right away if you are not doing well or get worse. Document Released: 02/27/2003 Document Revised: 03/31/2013 Document Reviewed: 08/03/2008 [facility] Patient Information 2014 [place].  Angioedema Angioedema is a sudden swelling of tissues, often of the skin. It can occur on the face or genitals or in the abdomen or other body parts. The swelling usually develops over a short period  and gets better in 24 to 48 hours. It often begins during the night and is found when the person wakes up. The person may also get red, itchy patches of skin (hives). Angioedema can be dangerous if it involves swelling of the air passages.  Depending on the cause, episodes of angioedema may only happen once, come back in unpredictable patterns, or repeat for several years and then gradually fade away.  CAUSES  Angioedema can be caused by an allergic reaction to various triggers. It can also result from nonallergic causes, including reactions to drugs, immune system disorders, viral infections, or an abnormal gene that is passed to you from your parents (hereditary). For some people with angioedema, the cause is unknown.  Some things that can trigger angioedema include:   Foods.   Medicines, such as ACE inhibitors, ARBs, nonsteroidal anti-inflammatory agents, or estrogen.   Latex.   Animal saliva.   Insect stings.   Dyes used in X-rays.   Mild injury.   Dental work.  Surgery.  Stress.   Sudden changes in temperature.   Exercise. SIGNS AND SYMPTOMS   Swelling of the skin.  Hives. If these are present, there is also intense itching.  Redness in the affected area.   Pain in  the affected area.  Swollen lips or tongue.  Breathing problems. This may happen if the air passages swell.  Wheezing. If internal organs are involved, there may be:   Nausea.   Abdominal pain.   Vomiting.   Difficulty swallowing.   Difficulty passing urine. DIAGNOSIS   Your health care provider will examine the affected area and take a medical and family history.  Various tests may be done to help determine the cause. Tests may include:  Allergy skin tests to see if the problem is an allergic reaction.   Blood tests to check for hereditary angioedema.   Tests to check for underlying diseases that could cause the condition.   A review of your medicines, including over  the counter medicines, may be done. TREATMENT  Treatment will depend on the cause of the angioedema. Possible treatments include:   Removal of anything that triggered the condition (such as stopping certain medicines).   Medicines to treat symptoms or prevent attacks. Medicines given may include:   Antihistamines.   Epinephrine injection.   Steroids.   Hospitalization may be required for severe attacks. If the air passages are affected, it can be an emergency. Tubes may need to be placed to keep the airway open. HOME CARE INSTRUCTIONS   Only take over-the-counter or prescription medicines as directed by your health care provider.  If you were given medicines for emergency allergy treatment, always carry them with you.  Wear a medical bracelet as directed by your health care provider.   Avoid known triggers. SEEK MEDICAL CARE IF:   You have repeat attacks of angioedema.   Your attacks are more frequent or more severe despite preventive measures.   You have hereditary angioedema and are considering having children. It is important to discuss the risks of passing the condition on to your children with your health care provider. SEEK IMMEDIATE MEDICAL CARE IF:   You have severe swelling of the mouth, tongue, or lips.  You have difficulty breathing.   You have difficulty swallowing.   You faint. MAKE SURE YOU:  Understand these instructions.  Will watch your condition.  Will get help right away if you are not doing well or get worse. Document Released: 02/12/2002 Document Revised: 09/24/2013 Document Reviewed: 07/28/2013 [facility] Patient Information 2014 [place], [place].

## 2014-02-23 NOTE — ED Provider Notes (Addendum)
CSN: [id number]     Arrival date & time 02/23/14  2116 History   First MD Initiated Contact with Patient 02/23/14 2119     Chief Complaint  Patient presents with  . Allergic Reaction     (Consider location/radiation/quality/duration/timing/severity/associated sxs/prior Treatment) HPI Comments: Pt sent to the ER with cc of allergic reaction. Pt has hx of HTN, on lisinopril. No hx of allergic rxn. States that at 6 pm, she noticed some itching and wheals. Went to the urgent care, and noticed her voice was different, she had some trouble swallowing -so she was transferred here. No dib, no wheezing, no drooling, and patient is feeling better after her IM meds.  Patient is a 33 y.o. female presenting with allergic reaction. The history is provided by the patient and medical records.  Allergic Reaction Presenting symptoms: rash   Presenting symptoms: no wheezing     Past Medical History  Diagnosis Date  . Anxiety   . Sickle cell anemia     sickle cell trait  . Arm DVT (deep venous thromboembolism), chronic     RUE  . Hypertension    No past surgical history on file. No family history on file. History  Substance Use Topics  . Smoking status: Former Smoker -- 0.50 packs/day for 10 years    Types: Cigarettes  . Smokeless tobacco: Not on file  . Alcohol Use: Yes     Comment: occasionally   OB History   Grav Para Term Preterm Abortions TAB SAB Ect Mult Living                 Review of Systems  Constitutional: Negative for activity change.  HENT: Negative for facial swelling.   Respiratory: Negative for cough, shortness of breath and wheezing.   Cardiovascular: Negative for chest pain.  Gastrointestinal: Negative for nausea, vomiting, abdominal pain, diarrhea, constipation, blood in stool and abdominal distention.  Genitourinary: Negative for hematuria and difficulty urinating.  Musculoskeletal: Negative for neck pain.  Skin: Positive for rash. Negative for color change.   Neurological: Negative for speech difficulty.  Hematological: Does not bruise/bleed easily.  Psychiatric/Behavioral: Negative for confusion.  All other systems reviewed and are negative.      Allergies  Lisinopril  Home Medications   Current Outpatient Rx  Name  Route  Sig  Dispense  Refill  . Chlorphen-Phenyleph-ASA (ALKA-SELTZER PLUS COLD) 2-7.8-325 MG TBEF   Oral   Take 2 capsules by mouth daily as needed (for cold).         Marland Kitchen. HYDROcodone-acetaminophen (NORCO/VICODIN) 5-325 MG per tablet   Oral   Take 1 tablet by mouth every 6 (six) hours as needed for moderate pain.         Marland Kitchen. lisinopril (PRINIVIL,ZESTRIL) 20 MG tablet   Oral   Take 1 tablet (20 mg total) by mouth daily.   30 tablet   1   . diphenhydrAMINE (BENADRYL) 25 mg capsule   Oral   Take 1 capsule (25 mg total) by mouth every 6 (six) hours as needed for itching.   20 capsule   0   . predniSONE (DELTASONE) 50 MG tablet   Oral   Take 1 tablet (50 mg total) by mouth daily.   5 tablet   0   . ranitidine (ZANTAC) 150 MG tablet   Oral   Take 1 tablet (150 mg total) by mouth 2 (two) times daily.   10 tablet   0    BP 176/106  Pulse 107  Temp(Src) 97.8 F (36.6 C) (Oral)  Resp 20  SpO2 100%  LMP 02/06/2014 Physical Exam  Nursing note and vitals reviewed. Constitutional: She is oriented to person, place, and time. She appears well-developed and well-nourished.  HENT:  Head: Normocephalic and atraumatic.  Mouth/Throat: Oropharynx is clear and moist. No oropharyngeal exudate.  Eyes: EOM are normal. Pupils are equal, round, and reactive to light.  Neck: Neck supple.  Cardiovascular: Normal rate, regular rhythm and normal heart sounds.   No murmur heard. Pulmonary/Chest: Effort normal. No respiratory distress. She has no wheezes.  Abdominal: Soft. She exhibits no distension. There is no tenderness. There is no rebound and no guarding.  Neurological: She is alert and oriented to person, place, and  time.  Skin: Skin is warm and dry. Rash noted.    ED Course  Procedures (including critical care time) Labs Review Labs Reviewed - No data to display Imaging Review No results found.   EKG Interpretation None      MDM   Final diagnoses:  Allergic reaction  Angioedema    Pt comes in to the ED with cc of allergic reaction. She is currently stable from resp point of view - and her sx have improved. Mild urticaria type wheezing noted.  Pt could have had lisinopril related angioedema. Other allergic etiologies possible as well.  Pt's sx onset at 6 pm - we saw her at 40 - and observed her in the ED until 11 and she is doing well.  Will discharge. Asked her to stop lisinopril. She has a pcp appt on Wednesday. BP is high -asymptomatic.  Derwood Kaplan, MD 02/23/14 9604  Derwood Kaplan, MD 02/23/14 2310

## 2014-02-24 NOTE — ED Provider Notes (Signed)
Medical screening examination/treatment/procedure(s) were performed by resident physician or non-physician practitioner and as supervising physician I was immediately available for consultation/collaboration.   Barkley BrunsKINDL,Naveena Eyman DOUGLAS MD.   Linna HoffJames D Page Pucciarelli, MD 02/24/14 1255

## 2014-05-26 ENCOUNTER — Other Ambulatory Visit (HOSPITAL_COMMUNITY)
Admission: RE | Admit: 2014-05-26 | Discharge: 2014-05-26 | Disposition: A | Discharge status: Home / Self Care | Payer: BC Managed Care – PPO | Source: Ambulatory Visit | Attending: Family Medicine | Admitting: Family Medicine

## 2014-05-26 ENCOUNTER — Other Ambulatory Visit: Payer: Self-pay | Admitting: Physician Assistant

## 2014-05-26 DIAGNOSIS — Z124 Encounter for screening for malignant neoplasm of cervix: Secondary | ICD-10-CM | POA: Insufficient documentation

## 2014-05-27 LAB — CYTOLOGY - PAP
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
## 2015-07-19 ENCOUNTER — Emergency Department (HOSPITAL_COMMUNITY): Payer: BC Managed Care – PPO

## 2015-07-19 ENCOUNTER — Emergency Department (HOSPITAL_COMMUNITY)
Admission: EM | Admit: 2015-07-19 | Discharge: 2015-07-19 | Disposition: A | Discharge status: Home / Self Care | Payer: BC Managed Care – PPO | Attending: Emergency Medicine | Admitting: Emergency Medicine

## 2015-07-19 ENCOUNTER — Encounter (HOSPITAL_COMMUNITY): Payer: Self-pay | Admitting: Nurse Practitioner

## 2015-07-19 DIAGNOSIS — M545 Low back pain: Secondary | ICD-10-CM | POA: Insufficient documentation

## 2015-07-19 DIAGNOSIS — R200 Anesthesia of skin: Secondary | ICD-10-CM | POA: Insufficient documentation

## 2015-07-19 DIAGNOSIS — Z87891 Personal history of nicotine dependence: Secondary | ICD-10-CM | POA: Insufficient documentation

## 2015-07-19 DIAGNOSIS — F419 Anxiety disorder, unspecified: Secondary | ICD-10-CM | POA: Insufficient documentation

## 2015-07-19 DIAGNOSIS — I10 Essential (primary) hypertension: Secondary | ICD-10-CM | POA: Insufficient documentation

## 2015-07-19 DIAGNOSIS — Z862 Personal history of diseases of the blood and blood-forming organs and certain disorders involving the immune mechanism: Secondary | ICD-10-CM | POA: Insufficient documentation

## 2015-07-19 DIAGNOSIS — Z86718 Personal history of other venous thrombosis and embolism: Secondary | ICD-10-CM | POA: Insufficient documentation

## 2015-07-19 LAB — CBC
HCT: 37.6 % (ref 36.0–46.0)
Hemoglobin: 13.3 g/dL (ref 12.0–15.0)
MCH: 28.9 pg (ref 26.0–34.0)
MCHC: 35.4 g/dL (ref 30.0–36.0)
MCV: 81.6 fL (ref 78.0–100.0)
Platelets: 209 10*3/uL (ref 150–400)
RBC: 4.61 MIL/uL (ref 3.87–5.11)
RDW: 14.2 % (ref 11.5–15.5)
WBC: 10.9 10*3/uL — ABNORMAL HIGH (ref 4.0–10.5)

## 2015-07-19 LAB — BASIC METABOLIC PANEL
Anion gap: 6 (ref 5–15)
BUN: 13 mg/dL (ref 6–20)
CHLORIDE: 110 mmol/L (ref 101–111)
CO2: 22 mmol/L (ref 22–32)
Calcium: 9.1 mg/dL (ref 8.9–10.3)
Creatinine, Ser: 1.01 mg/dL — ABNORMAL HIGH (ref 0.44–1.00)
GFR calc Af Amer: >60 mL/min (ref >60)
GFR calc non Af Amer: >60 mL/min (ref >60)
Glucose, Bld: 101 mg/dL — ABNORMAL HIGH (ref 65–99)
Potassium: 3.8 mmol/L (ref 3.5–5.1)
Sodium: 138 mmol/L (ref 135–145)

## 2015-07-19 LAB — I-STAT BETA HCG BLOOD, ED (MC, WL, AP ONLY): I-stat hCG, quantitative: <5 m[IU]/mL (ref <5)

## 2015-07-19 MED ORDER — KETOROLAC TROMETHAMINE 30 MG/ML IJ SOLN
30.0000 mg | Freq: Once | INTRAMUSCULAR | Status: AC
  Administered 2015-07-19: 30 mg via INTRAVENOUS
  Filled 2015-07-19: qty 1

## 2015-07-19 MED ORDER — SODIUM CHLORIDE 0.9 % IV BOLUS (SEPSIS)
1000.0000 mL | Freq: Once | INTRAVENOUS | Status: AC
  Administered 2015-07-19: 1000 mL via INTRAVENOUS

## 2015-07-19 NOTE — ED Notes (Signed)
Pt reports R foot tingling x 3 weeks, then yesterday she began to feel the tingling in her R hand. She is concerned for HTN because she has a history of it and is not on medication for it. shes had headaches and SOB intermittent since last week also. She is A&Ox4, resp e/u

## 2015-07-19 NOTE — ED Provider Notes (Signed)
CSN: [id number]     Arrival date & time 07/19/15  1134 History   First MD Initiated Contact with Patient 07/19/15 1251     Chief Complaint  Patient presents with  . Numbness     (Consider location/radiation/quality/duration/timing/severity/associated sxs/prior Treatment) HPI Comments: Patient here complaining of R toe tingling. R great toe tingling, no other foot tingling. Occasional lower back pain. Toe tingling began about 3 weeks ago. Constant.  Mild R hand tingling, began a few days ago. Intermittent.   No trauma noted. No new medications. She is also worried about her BP, however she has an appointment with [place] primary care in 10 days.  Patient is a 34 y.o. female presenting with neurologic complaint. The history is provided by the patient.  Neurologic Problem This is a new problem. The current episode started more than 1 week ago. The problem occurs constantly. The problem has not changed since onset.Pertinent negatives include no shortness of breath. Nothing aggravates the symptoms. Nothing relieves the symptoms. She has tried nothing for the symptoms.    Past Medical History  Diagnosis Date  . Anxiety   . Sickle cell anemia     sickle cell trait  . Arm DVT (deep venous thromboembolism), chronic     RUE  . Hypertension    History reviewed. No pertinent past surgical history. History reviewed. No pertinent family history. History  Substance Use Topics  . Smoking status: Former Smoker -- 0.50 packs/day for 10 years    Types: Cigarettes  . Smokeless tobacco: Not on file  . Alcohol Use: Yes     Comment: occasionally   OB History    No data available     Review of Systems  Constitutional: Negative for fever.  Respiratory: Negative for cough and shortness of breath.   Gastrointestinal: Negative for vomiting.  All other systems reviewed and are negative.     Allergies  Lisinopril  Home Medications   Prior to Admission medications   Medication Sig Start  Date End Date Taking? Authorizing Provider  diphenhydrAMINE (BENADRYL) 25 mg capsule Take 1 capsule (25 mg total) by mouth every 6 (six) hours as needed for itching. Patient not taking: Reported on 07/19/2015 02/23/14   Derwood Kaplan, MD  lisinopril (PRINIVIL,ZESTRIL) 20 MG tablet Take 1 tablet (20 mg total) by mouth daily. Patient not taking: Reported on 07/19/2015 02/10/14   Eber Hong, MD  predniSONE (DELTASONE) 50 MG tablet Take 1 tablet (50 mg total) by mouth daily. Patient not taking: Reported on 07/19/2015 02/23/14   Derwood Kaplan, MD  ranitidine (ZANTAC) 150 MG tablet Take 1 tablet (150 mg total) by mouth 2 (two) times daily. Patient not taking: Reported on 07/19/2015 02/23/14   Derwood Kaplan, MD   BP 174/95 mmHg  Pulse 68  Temp(Src) 98 F (36.7 C) (Oral)  Resp 25  SpO2 100%  LMP 07/03/2015 Physical Exam  Constitutional: She is oriented to person, place, and time. She appears well-developed and well-nourished. No distress.  HENT:  Head: Normocephalic and atraumatic.  Mouth/Throat: Oropharynx is clear and moist.  Eyes: EOM are normal. Pupils are equal, round, and reactive to light.  Neck: Normal range of motion. Neck supple.  Cardiovascular: Normal rate and regular rhythm.  Exam reveals no friction rub.   No murmur heard. Pulmonary/Chest: Effort normal and breath sounds normal. No respiratory distress. She has no wheezes. She has no rales.  Abdominal: Soft. She exhibits no distension. There is no tenderness. There is no rebound.  Musculoskeletal: Normal range  of motion. She exhibits no edema.  Neurological: She is alert and oriented to person, place, and time. No cranial nerve deficit or sensory deficit. She exhibits normal muscle tone. GCS eye subscore is 4. GCS verbal subscore is 5. GCS motor subscore is 6.  Skin: She is not diaphoretic.  Nursing note and vitals reviewed.   ED Course  Procedures (including critical care time) Labs Review Labs Reviewed  CBC - Abnormal; Notable for  the following:    WBC 10.9 (*)    All other components within normal limits  BASIC METABOLIC PANEL - Abnormal; Notable for the following:    Glucose, Bld 101 (*)    Creatinine, Ser 1.01 (*)    All other components within normal limits  I-STAT BETA HCG BLOOD, ED ([facility], [facility], [facility] ONLY)    Imaging Review Dg Chest 2 View  07/19/2015   CLINICAL DATA:  Shortness of breath for 2 weeks. History of sickle cell disease.  EXAM: CHEST  2 VIEW  COMPARISON:  02/09/2014  FINDINGS: The heart size and mediastinal contours are within normal limits. Both lungs are clear. The visualized skeletal structures are unremarkable.  IMPRESSION: Normal chest x-ray.   Electronically Signed   By: Rudie Meyer M.D.   On: 07/19/2015 12:57     EKG Interpretation   Date/Time:  Monday July 19 2015 11:52:02 EDT Ventricular Rate:  74 PR Interval:  164 QRS Duration: 66 QT Interval:  374 QTC Calculation: 415 R Axis:   66 Text Interpretation:  Normal sinus rhythm Septal infarct , age  undetermined Abnormal ECG No significant change since last tracing  Confirmed by Gwendolyn Grant  MD, Frannie Shedrick [phone]) on 07/19/2015 12:50:38 PM      MDM   Final diagnoses:  Numbness    34 year old female presents with right foot tingling for the past 3 weeks. Located in her great toe. Does not radiate up her leg. No trauma. She does have some occasional lower back pain. She's had tingling that is similar in her right hand asked few days. Has been constant. She is also worried about an elevated blood pressure. Here she has some mildly elevated blood pressure. She is following up with [place] primary care in 10 days so I instructed her to follow-up as instructed for her BP. On exam she has no major sensory deficits. She denies any alteration in light touch between her foot and her thumb is the contralateral side. I do not feel she warrants an emergent MRI. No concern for stroke. After fluids and toradol (in case her toe tingling was from lower back  inflammation), she is feeling better. Instructed to f/u with PCP.    Elwin Mocha, MD 07/19/15 1524

## 2015-07-19 NOTE — Discharge Instructions (Signed)

## 2015-07-29 ENCOUNTER — Encounter: Payer: Self-pay | Admitting: Family

## 2015-07-29 ENCOUNTER — Other Ambulatory Visit (INDEPENDENT_AMBULATORY_CARE_PROVIDER_SITE_OTHER): Payer: No Typology Code available for payment source

## 2015-07-29 ENCOUNTER — Ambulatory Visit (INDEPENDENT_AMBULATORY_CARE_PROVIDER_SITE_OTHER): Payer: No Typology Code available for payment source | Admitting: Family

## 2015-07-29 VITALS — BP 160/112 | HR 71 | Temp 98.3°F | Resp 18 | Ht 68.0 in | Wt 171.0 lb

## 2015-07-29 DIAGNOSIS — R208 Other disturbances of skin sensation: Secondary | ICD-10-CM

## 2015-07-29 DIAGNOSIS — R200 Anesthesia of skin: Secondary | ICD-10-CM

## 2015-07-29 DIAGNOSIS — I10 Essential (primary) hypertension: Secondary | ICD-10-CM | POA: Diagnosis not present

## 2015-07-29 LAB — HEMOGLOBIN A1C: Hgb A1c MFr Bld: 5.5 % (ref 4.6–6.5)

## 2015-07-29 LAB — VITAMIN B12: Vitamin B-12: 944 pg/mL — ABNORMAL HIGH (ref 211–911)

## 2015-07-29 MED ORDER — HYDROCHLOROTHIAZIDE 25 MG PO TABS: 25.0000 mg | ORAL_TABLET | Freq: Every day | ORAL | Status: DC

## 2015-07-29 NOTE — Progress Notes (Signed)
Subjective:    Patient ID: Rita Harrison, female    DOB: [birth year], 34 y.o.   MRN: [id number]  Chief Complaint  Patient presents with  . Establish Care    Was seen in the ED for right big toe numbness, she is still having the issue, x3 months bothers her more at night    HPI:  Rita Harrison is a 34 y.o. female with a PMH of anxiety, hypertension, and sickle cell anemia who presents today for an office visit to establish care.    1.) Blood Pressure - Previously diagnosed with hypertension and is not currently maintained on medications. Blood pressure at home averages around 140-160/94-110. Tried on lisinopril and had an allergic reaction.   BP Readings from Last 3 Encounters:  07/29/15 160/112  07/19/15 174/95  02/23/14 190/117    2.) Toe tingling - Associated symptom of toe tingling located in her right great toe and partially 2nd has been going on for about 3 months. Appears to be slightly worsening. Modifying factors include epson salt soaks and Biofreeze which have not helped very much. Denies trauma to the area. Has experienced some excessive hunger.   Allergies  Allergen Reactions  . Lisinopril Shortness Of Breath and Itching     Outpatient Prescriptions Prior to Visit  Medication Sig Dispense Refill  . diphenhydrAMINE (BENADRYL) 25 mg capsule Take 1 capsule (25 mg total) by mouth every 6 (six) hours as needed for itching. (Patient not taking: Reported on 07/19/2015) 20 capsule 0  . lisinopril (PRINIVIL,ZESTRIL) 20 MG tablet Take 1 tablet (20 mg total) by mouth daily. (Patient not taking: Reported on 07/19/2015) 30 tablet 1  . predniSONE (DELTASONE) 50 MG tablet Take 1 tablet (50 mg total) by mouth daily. (Patient not taking: Reported on 07/19/2015) 5 tablet 0  . ranitidine (ZANTAC) 150 MG tablet Take 1 tablet (150 mg total) by mouth 2 (two) times daily. (Patient not taking: Reported on 07/19/2015) 10 tablet 0   No facility-administered medications prior to visit.      Past Medical History  Diagnosis Date  . Anxiety   . Sickle cell anemia     sickle cell trait  . Arm DVT (deep venous thromboembolism), chronic     RUE  . Hypertension   . Chicken pox      History reviewed. No pertinent past surgical history.   Family History  Problem Relation Age of Onset  . Sickle cell anemia Father   . Kidney cancer Father   . Breast cancer Maternal Grandmother      Social History   Social History  . Marital Status: Single    Spouse Name: N/A  . Number of Children: 1  . Years of Education: 13   Occupational History  . CNA    Social History Main Topics  . Smoking status: Former Smoker -- 0.50 packs/day for 10 years    Types: Cigarettes  . Smokeless tobacco: Never Used  . Alcohol Use: Yes     Comment: occasionally  . Drug Use: Yes    Special: Marijuana     Comment: occasionally  . Sexual Activity: Not Currently    Birth Control/ Protection: None   Other Topics Concern  . Not on file   Social History Narrative   Fun: Travel, family, son's football games    Denies religious beliefs effecting health care   Feels safe at home and denies abuse.      Review of Systems  Eyes:  Negative for changes in vision.   Respiratory: Negative for chest tightness.   Cardiovascular: Negative for chest pain, palpitations and leg swelling.  Neurological: Positive for numbness. Negative for headaches.      Objective:    BP 160/112 mmHg  Pulse 71  Temp(Src) 98.3 F (36.8 C) (Oral)  Resp 18  Ht  (1.727 m)  Wt 171 lb (77.565 kg)  BMI 26.01 kg/m2  SpO2 99%  LMP 07/03/2015 Nursing note and vital signs reviewed.  Physical Exam  Constitutional: She is oriented to person, place, and time. She appears well-developed and well-nourished. No distress.  Cardiovascular: Normal rate, regular rhythm, normal heart sounds and intact distal pulses.   Pulmonary/Chest: Effort normal and breath sounds normal.  Musculoskeletal:  Right foot - no  obvious deformity, discoloration, or edema noted. Mild tenderness elicited on the dorsal aspect of the foot in area of the sesamoid bones. Numbness and tingling noted with good sensation of the great toe. Range of motion is complete. Proximal pulses are intact and appropriate.  Neurological: She is alert and oriented to person, place, and time.  Skin: Skin is warm and dry.  Psychiatric: She has a normal mood and affect. Her behavior is normal. Judgment and thought content normal.       Assessment & Plan:   Problem List Items Addressed This Visit      Cardiovascular and Mediastinum   Essential hypertension    Blood pressure remains uncontrolled and greater than 140/90. Not currently taking any medications. Discussed importance of blood pressure medication and prevention of end organ damage. Start hydrochlorothiazide. Continue to monitor blood pressures at home. Follow-up in one month for blood pressure check and evaluation medication.      Relevant Medications   hydrochlorothiazide (HYDRODIURIL) 25 MG tablet     Other   Numbness of toes - Primary    Numbness and tingling of the great and second toe on the right of undetermined origin. Obtain B12 and A1c to rule out metabolic causes. Discussed importance of wearing proper footwear. Follow-up pending blood work results.      Relevant Orders   B12   Hemoglobin A1c

## 2015-07-29 NOTE — Assessment & Plan Note (Signed)
Numbness and tingling of the great and second toe on the right of undetermined origin. Obtain B12 and A1c to rule out metabolic causes. Discussed importance of wearing proper footwear. Follow-up pending blood work results.

## 2015-07-29 NOTE — Progress Notes (Signed)
Pre visit review using our clinic review tool, if applicable. No additional management support is needed unless otherwise documented below in the visit note. 

## 2015-07-29 NOTE — Patient Instructions (Signed)
Thank you for choosing [facility].  Summary/Instructions:  Your prescription(s) have been submitted to your pharmacy or been printed and provided for you. Please take as directed and contact our office if you believe you are having problem(s) with the medication(s) or have any questions.  If your symptoms worsen or fail to improve, please contact our office for further instruction, or in case of emergency go directly to the emergency room at the closest medical facility.   Hypertension Hypertension, commonly called high blood pressure, is when the force of blood pumping through your arteries is too strong. Your arteries are the blood vessels that carry blood from your heart throughout your body. A blood pressure reading consists of a higher number over a lower number, such as 110/72. The higher number (systolic) is the pressure inside your arteries when your heart pumps. The lower number (diastolic) is the pressure inside your arteries when your heart relaxes. Ideally you want your blood pressure below 120/80. Hypertension forces your heart to work harder to pump blood. Your arteries may become narrow or stiff. Having hypertension puts you at risk for heart disease, stroke, and other problems.  RISK FACTORS Some risk factors for high blood pressure are controllable. Others are not.  Risk factors you cannot control include:   Race. You may be at higher risk if you are African American.  Age. Risk increases with age.  Gender. Men are at higher risk than women before age 45 years. After age 65, women are at higher risk than men. Risk factors you can control include:  Not getting enough exercise or physical activity.  Being overweight.  Getting too much fat, sugar, calories, or salt in your diet.  Drinking too much alcohol. SIGNS AND SYMPTOMS Hypertension does not usually cause signs or symptoms. Extremely high blood pressure (hypertensive crisis) may cause headache, anxiety,  shortness of breath, and nosebleed. DIAGNOSIS  To check if you have hypertension, your health care provider will measure your blood pressure while you are seated, with your arm held at the level of your heart. It should be measured at least twice using the same arm. Certain conditions can cause a difference in blood pressure between your right and left arms. A blood pressure reading that is higher than normal on one occasion does not mean that you need treatment. If one blood pressure reading is high, ask your health care provider about having it checked again. TREATMENT  Treating high blood pressure includes making lifestyle changes and possibly taking medicine. Living a healthy lifestyle can help lower high blood pressure. You may need to change some of your habits. Lifestyle changes may include:  Following the DASH diet. This diet is high in fruits, vegetables, and whole grains. It is low in salt, red meat, and added sugars.  Getting at least 2 hours of brisk physical activity every week.  Losing weight if necessary.  Not smoking.  Limiting alcoholic beverages.  Learning ways to reduce stress. If lifestyle changes are not enough to get your blood pressure under control, your health care provider may prescribe medicine. You may need to take more than one. Work closely with your health care provider to understand the risks and benefits. HOME CARE INSTRUCTIONS  Have your blood pressure rechecked as directed by your health care provider.   Take medicines only as directed by your health care provider. Follow the directions carefully. Blood pressure medicines must be taken as prescribed. The medicine does not work as well when you skip doses.   Skipping doses also puts you at risk for problems.   Do not smoke.   Monitor your blood pressure at home as directed by your health care provider. SEEK MEDICAL CARE IF:   You think you are having a reaction to medicines taken.  You have  recurrent headaches or feel dizzy.  You have swelling in your ankles.  You have trouble with your vision. SEEK IMMEDIATE MEDICAL CARE IF:  You develop a severe headache or confusion.  You have unusual weakness, numbness, or feel faint.  You have severe chest or abdominal pain.  You vomit repeatedly.  You have trouble breathing. MAKE SURE YOU:   Understand these instructions.  Will watch your condition.  Will get help right away if you are not doing well or get worse. Document Released: 12/04/2005 Document Revised: 04/20/2014 Document Reviewed: 09/26/2013 Exit[employer] Patient Information 2015 [employer]. This information is not intended to replace advice given to you by your health care provider. Make sure you discuss any questions you have with your health care provider.   

## 2015-07-29 NOTE — Assessment & Plan Note (Signed)
Blood pressure remains uncontrolled and greater than 140/90. Not currently taking any medications. Discussed importance of blood pressure medication and prevention of end organ damage. Start hydrochlorothiazide. Continue to monitor blood pressures at home. Follow-up in one month for blood pressure check and evaluation medication.

## 2015-08-01 ENCOUNTER — Telehealth: Payer: Self-pay | Admitting: Family

## 2015-08-01 DIAGNOSIS — R200 Anesthesia of skin: Secondary | ICD-10-CM

## 2015-08-01 NOTE — Telephone Encounter (Signed)
Please inform patient that her lab results show that her A1c is normal and her B12 is high. Therefore these are not causing the numbness in her toes. If she continues to experience the symptom, since she has no back pain, the next step would be to refer her to neurology if she is interested.

## 2015-08-03 NOTE — Telephone Encounter (Signed)
Referral to neurology has been placed and no, there is nothing that needs to be done about her B12 levels at this time.

## 2015-08-03 NOTE — Telephone Encounter (Signed)
LVM for pt to call back.

## 2015-08-03 NOTE — Telephone Encounter (Signed)
Spoke with pt to inform of Greg's message

## 2015-08-03 NOTE — Telephone Encounter (Signed)
Pt called back, informed her about labs. She stated that she would like for the Neurology referral to be placed. She also asked what she could do for the abnormal B12 results. Please advise.

## 2015-08-18 ENCOUNTER — Ambulatory Visit: Payer: No Typology Code available for payment source | Admitting: Neurology

## 2016-08-04 ENCOUNTER — Other Ambulatory Visit: Payer: Self-pay | Admitting: Family

## 2016-08-04 DIAGNOSIS — I10 Essential (primary) hypertension: Secondary | ICD-10-CM

## 2017-02-08 ENCOUNTER — Other Ambulatory Visit: Payer: Self-pay | Admitting: Obstetrics & Gynecology

## 2017-02-12 LAB — CYTOLOGY - PAP

## 2018-02-20 ENCOUNTER — Telehealth: Payer: Self-pay | Admitting: Emergency Medicine

## 2018-02-20 NOTE — Telephone Encounter (Signed)
Called and spoke to pt to remind them of their apt tomorrow. Advised of building number and time restrictions. °

## 2018-02-21 ENCOUNTER — Ambulatory Visit: Payer: BLUE CROSS/BLUE SHIELD | Admitting: Emergency Medicine

## 2018-02-21 ENCOUNTER — Other Ambulatory Visit: Payer: Self-pay

## 2018-02-21 ENCOUNTER — Encounter: Payer: Self-pay | Admitting: Emergency Medicine

## 2018-02-21 VITALS — BP 120/82 | HR 91 | Temp 98.4°F | Resp 16 | Ht 66.75 in | Wt 179.8 lb

## 2018-02-21 DIAGNOSIS — Z23 Encounter for immunization: Secondary | ICD-10-CM

## 2018-02-21 DIAGNOSIS — N926 Irregular menstruation, unspecified: Secondary | ICD-10-CM | POA: Diagnosis not present

## 2018-02-21 LAB — POCT URINALYSIS DIP (MANUAL ENTRY)
Bilirubin, UA: NEGATIVE
GLUCOSE UA: NEGATIVE mg/dL
Nitrite, UA: NEGATIVE
Protein Ur, POC: NEGATIVE mg/dL
Urobilinogen, UA: 0.2 E.U./dL
pH, UA: 5.5 (ref 5.0–8.0)

## 2018-02-21 LAB — POCT URINE PREGNANCY: Preg Test, Ur: NEGATIVE

## 2018-02-21 NOTE — Patient Instructions (Addendum)
   IF you received an x-ray today, you will receive an invoice from [facility]. Please contact [facility] at [phone] with questions or concerns regarding your invoice.   IF you received labwork today, you will receive an invoice from [facility]. Please contact [facility] at [phone] with questions or concerns regarding your invoice.   Our billing staff will not be able to assist you with questions regarding bills from these companies.  You will be contacted with the lab results as soon as they are available. The fastest way to get your results is to activate your My Chart account. Instructions are located on the last page of this paperwork. If you have not heard from us regarding the results in 2 weeks, please contact this office.    Perimenopause Perimenopause is the time when your body begins to move into the menopause (no menstrual period for 12 straight months). It is a natural process. Perimenopause can begin 2-8 years before the menopause and usually lasts for 1 year after the menopause. During this time, your ovaries may or may not produce an egg. The ovaries vary in their production of estrogen and progesterone hormones each month. This can cause irregular menstrual periods, difficulty getting pregnant, vaginal bleeding between periods, and uncomfortable symptoms. What are the causes?  Irregular production of the ovarian hormones, estrogen and progesterone, and not ovulating every month. Other causes include:  Tumor of the pituitary gland in the brain.  Medical disease that affects the ovaries.  Radiation treatment.  Chemotherapy.  Unknown causes.  Heavy smoking and excessive alcohol intake can bring on perimenopause sooner.  What are the signs or symptoms?  Hot flashes.  Night sweats.  Irregular menstrual periods.  Decreased sex drive.  Vaginal dryness.  Headaches.  Mood swings.  Depression.  Memory  problems.  Irritability.  Tiredness.  Weight gain.  Trouble getting pregnant.  The beginning of losing bone cells (osteoporosis).  The beginning of hardening of the arteries (atherosclerosis). How is this diagnosed? Your health care provider will make a diagnosis by analyzing your age, menstrual history, and symptoms. He or she will do a physical exam and note any changes in your body, especially your female organs. Female hormone tests may or may not be helpful depending on the amount of female hormones you produce and when you produce them. However, other hormone tests may be helpful to rule out other problems. How is this treated? In some cases, no treatment is needed. The decision on whether treatment is necessary during the perimenopause should be made by you and your health care provider based on how the symptoms are affecting you and your lifestyle. Various treatments are available, such as:  Treating individual symptoms with a specific medicine for that symptom.  Herbal medicines that can help specific symptoms.  Counseling.  Group therapy.  Follow these instructions at home:  Keep track of your menstrual periods (when they occur, how heavy they are, how long between periods, and how long they last) as well as your symptoms and when they started.  Only take over-the-counter or prescription medicines as directed by your health care provider.  Sleep and rest.  Exercise.  Eat a diet that contains calcium (good for your bones) and soy (acts like the estrogen hormone).  Do not smoke.  Avoid alcoholic beverages.  Take vitamin supplements as recommended by your health care provider. Taking vitamin E may help in certain cases.  Take calcium and vitamin D supplements to help prevent bone loss.    Group therapy is sometimes helpful.  Acupuncture may help in some cases. Contact a health care provider if:  You have questions about any symptoms you are having.  You need  a referral to a specialist (gynecologist, psychiatrist, or psychologist). Get help right away if:  You have vaginal bleeding.  Your period lasts longer than 8 days.  Your periods are recurring sooner than 21 days.  You have bleeding after intercourse.  You have severe depression.  You have pain when you urinate.  You have severe headaches.  You have vision problems. This information is not intended to replace advice given to you by your health care provider. Make sure you discuss any questions you have with your health care provider. Document Released: 01/11/2005 Document Revised: 05/11/2016 Document Reviewed: 07/03/2013 [employer] Interactive Patient Education  2017 [employer]  

## 2018-02-21 NOTE — Progress Notes (Signed)
Rita Harrison 37 y.o.   Chief Complaint  Patient presents with  . Establish Care    PER PATIENT she has missed her period, spotting x 2 weeks ago and yesterday but no period    HISTORY OF PRESENT ILLNESS: This is a 37 y.o. female complaining of missing menstrual period and occasional spotting for the past 2 months.  Denies vaginal discharge.  Denies pelvic pain.  Did a pregnancy test yesterday and it was negative.  Sexually active but uses protection.  Denies nausea or vomiting.  Denies fever or chills.  Past medical history significant for hypertension.  There is a family history of early menopause.  HPI   Prior to Admission medications   Medication Sig Start Date End Date Taking? Authorizing Provider  hydrochlorothiazide (HYDRODIURIL) 25 MG tablet Take 1 tablet (25 mg total) by mouth daily. Overdue for yearly physical w/labs must see MD for refills 08/04/16  Yes Rita Speakalone, Rita Harrison, Rita Harrison    Allergies  Allergen Reactions  . Lisinopril Shortness Of Breath and Itching    Patient Active Problem List   Diagnosis Date Noted  . Numbness of toes 07/29/2015  . Essential hypertension 07/29/2015    Past Medical History:  Diagnosis Date  . Anxiety   . Arm DVT (deep venous thromboembolism), chronic (HCC)    RUE  . Chicken pox   . Hypertension   . Sickle cell anemia (HCC)    sickle cell trait    No past surgical history on file.  Social History   Socioeconomic History  . Marital status: Single    Spouse name: Not on file  . Number of children: 1  . Years of education: 6813  . Highest education level: Not on file  Social Needs  . Financial resource strain: Not on file  . Food insecurity - worry: Not on file  . Food insecurity - inability: Not on file  . Transportation needs - medical: Not on file  . Transportation needs - non-medical: Not on file  Occupational History  . Occupation: CNA  Tobacco Use  . Smoking status: Former Smoker    Packs/day: 0.50    Years: 10.00     Pack years: 5.00    Types: Cigarettes  . Smokeless tobacco: Never Used  Substance and Sexual Activity  . Alcohol use: Yes    Comment: occasionally  . Drug use: Yes    Types: Marijuana    Comment: occasionally  . Sexual activity: Not Currently    Birth control/protection: None  Other Topics Concern  . Not on file  Social History Narrative   Fun: Travel, family, son's football games    Denies religious beliefs effecting health care   Feels safe at home and denies abuse.     Family History  Problem Relation Age of Onset  . Sickle cell anemia Father   . Kidney cancer Father   . Breast cancer Maternal Grandmother      Review of Systems  Constitutional: Negative.  Negative for chills, fever, malaise/fatigue and weight loss.       Hot flashes  HENT: Negative.  Negative for congestion and sore throat.   Eyes: Negative.  Negative for blurred vision and double vision.  Respiratory: Negative.  Negative for cough, hemoptysis, shortness of breath and wheezing.   Cardiovascular: Negative.  Negative for chest pain and palpitations.  Gastrointestinal: Negative for abdominal pain, blood in stool, diarrhea, melena, nausea and vomiting.  Genitourinary: Negative.  Negative for dysuria, flank pain, frequency, hematuria  and urgency.  Musculoskeletal: Negative for back pain, myalgias and neck pain.  Skin: Negative for rash.  Neurological: Negative.  Negative for dizziness, sensory change, focal weakness and headaches.  Endo/Heme/Allergies: Negative.        History of sickle cell trait. History of DVT to the right arm 3 years ago.  All other systems reviewed and are negative.   Vitals:   02/21/18 1002  BP: 120/82  Pulse: 91  Resp: 16  Temp: 98.4 F (36.9 C)  SpO2: 95%    Physical Exam  Constitutional: She is oriented to person, place, and time. She appears well-developed and well-nourished.  HENT:  Head: Normocephalic and atraumatic.  Nose: Nose normal.  Mouth/Throat:  Oropharynx is clear and moist.  Eyes: Conjunctivae and EOM are normal. Pupils are equal, round, and reactive to light.  Neck: Normal range of motion. Neck supple.  Cardiovascular: Normal rate, regular rhythm and normal heart sounds.  Pulmonary/Chest: Effort normal and breath sounds normal.  Abdominal: Soft. She exhibits no distension. There is no tenderness. There is no guarding.  Musculoskeletal: Normal range of motion.  Lymphadenopathy:    She has no cervical adenopathy.  Neurological: She is alert and oriented to person, place, and time. No sensory deficit. She exhibits normal muscle tone.  Skin: Skin is warm and dry. Capillary refill takes less than 2 seconds. No rash noted.  Psychiatric: She has a normal mood and affect. Her behavior is normal.  Vitals reviewed.  Results for orders placed or performed in visit on 02/21/18 (from the past 24 hour(s))  POCT urine pregnancy     Status: None   Collection Time: 02/21/18 10:55 AM  Result Value Ref Range   Preg Test, Ur Negative Negative  POCT urinalysis dipstick     Status: Abnormal   Collection Time: 02/21/18 10:55 AM  Result Value Ref Range   Color, UA yellow yellow   Clarity, UA clear clear   Glucose, UA negative negative mg/dL   Bilirubin, UA negative negative   Ketones, POC UA trace (5) (A) negative mg/dL   Spec Grav, UA >=4.098 (A) 1.010 - 1.025   Blood, UA trace-lysed (A) negative   pH, UA 5.5 5.0 - 8.0   Protein Ur, POC negative negative mg/dL   Urobilinogen, UA 0.2 0.2 or 1.0 E.U./dL   Nitrite, UA Negative Negative   Leukocytes, UA Trace (A) Negative     ASSESSMENT & PLAN: Rita Harrison was seen today for establish care.  Diagnoses and all orders for this visit:  Irregular periods/menstrual cycles -     POCT urine pregnancy -     POCT urinalysis dipstick -     CBC with Differential/Platelet -     TSH -     Comprehensive metabolic panel -     Hemoglobin A1c -     Ambulatory referral to Gynecology -     Urine  Culture  Need for diphtheria-tetanus-pertussis (Tdap) vaccine -     Tdap vaccine greater than or equal to 7yo IM   Patient Instructions       IF you received an x-ray today, you will receive an invoice from [facility]. Please contact [facility] at [phone] with questions or concerns regarding your invoice.   IF you received labwork today, you will receive an invoice from [facility]. Please contact [facility] at [phone] with questions or concerns regarding your invoice.   Our billing staff will not be able to assist you with questions regarding bills from these companies.  You will be contacted with the lab results as soon as they are available. The fastest way to get your results is to activate your My Chart account. Instructions are located on the last page of this paperwork. If you have not heard from [place] regarding the results in 2 weeks, please contact this office.    Perimenopause Perimenopause is the time when your body begins to move into the menopause (no menstrual period for 12 straight months). It is a natural process. Perimenopause can begin 2-8 years before the menopause and usually lasts for 1 year after the menopause. During this time, your ovaries may or may not produce an egg. The ovaries vary in their production of estrogen and progesterone hormones each month. This can cause irregular menstrual periods, difficulty getting pregnant, vaginal bleeding between periods, and uncomfortable symptoms. What are the causes?  Irregular production of the ovarian hormones, estrogen and progesterone, and not ovulating every month. Other causes include:  Tumor of the pituitary gland in the brain.  Medical disease that affects the ovaries.  Radiation treatment.  Chemotherapy.  Unknown causes.  Heavy smoking and excessive alcohol intake can bring on perimenopause sooner.  What are the signs or symptoms?  Hot flashes.  Night sweats.  Irregular  menstrual periods.  Decreased sex drive.  Vaginal dryness.  Headaches.  Mood swings.  Depression.  Memory problems.  Irritability.  Tiredness.  Weight gain.  Trouble getting pregnant.  The beginning of losing bone cells (osteoporosis).  The beginning of hardening of the arteries (atherosclerosis). How is this diagnosed? Your health care provider will make a diagnosis by analyzing your age, menstrual history, and symptoms. He or she will do a physical exam and note any changes in your body, especially your female organs. Female hormone tests may or may not be helpful depending on the amount of female hormones you produce and when you produce them. However, other hormone tests may be helpful to rule out other problems. How is this treated? In some cases, no treatment is needed. The decision on whether treatment is necessary during the perimenopause should be made by you and your health care provider based on how the symptoms are affecting you and your lifestyle. Various treatments are available, such as:  Treating individual symptoms with a specific medicine for that symptom.  Herbal medicines that can help specific symptoms.  Counseling.  Group therapy.  Follow these instructions at home:  Keep track of your menstrual periods (when they occur, how heavy they are, how long between periods, and how long they last) as well as your symptoms and when they started.  Only take over-the-counter or prescription medicines as directed by your health care provider.  Sleep and rest.  Exercise.  Eat a diet that contains calcium (good for your bones) and soy (acts like the estrogen hormone).  Do not smoke.  Avoid alcoholic beverages.  Take vitamin supplements as recommended by your health care provider. Taking vitamin E may help in certain cases.  Take calcium and vitamin Harrison supplements to help prevent bone loss.  Group therapy is sometimes helpful.  Acupuncture may help in  some cases. Contact a health care provider if:  You have questions about any symptoms you are having.  You need a referral to a specialist (gynecologist, psychiatrist, or psychologist). Get help right away if:  You have vaginal bleeding.  Your period lasts longer than 8 days.  Your periods are recurring sooner than 21 days.  You have bleeding after intercourse.  You have severe depression.  You have pain when you urinate.  You have severe headaches.  You have vision problems. This information is not intended to replace advice given to you by your health care provider. Make sure you discuss any questions you have with your health care provider. Document Released: 01/11/2005 Document Revised: 05/11/2016 Document Reviewed: 07/03/2013 Elsevier Interactive Patient Education  2017 Elsevier Inc.      Edwina Barth, MD [facility]

## 2018-02-22 LAB — COMPREHENSIVE METABOLIC PANEL
ALBUMIN: 4.2 g/dL (ref 3.5–5.5)
ALK PHOS: 54 IU/L (ref 39–117)
ALT: 15 IU/L (ref 0–32)
AST: 13 IU/L (ref 0–40)
Albumin/Globulin Ratio: 1.6 (ref 1.2–2.2)
BUN / CREAT RATIO: 20 (ref 9–23)
BUN: 19 mg/dL (ref 6–20)
Bilirubin Total: 0.4 mg/dL (ref 0.0–1.2)
CO2: 25 mmol/L (ref 20–29)
Calcium: 9 mg/dL (ref 8.7–10.2)
Chloride: 106 mmol/L (ref 96–106)
Creatinine, Ser: 0.94 mg/dL (ref 0.57–1.00)
GFR calc Af Amer: 89 mL/min/{1.73_m2} (ref >59)
GFR, EST NON AFRICAN AMERICAN: 77 mL/min/{1.73_m2} (ref >59)
GLOBULIN, TOTAL: 2.6 g/dL (ref 1.5–4.5)
Glucose: 120 mg/dL — ABNORMAL HIGH (ref 65–99)
POTASSIUM: 3.8 mmol/L (ref 3.5–5.2)
SODIUM: 143 mmol/L (ref 134–144)
Total Protein: 6.8 g/dL (ref 6.0–8.5)

## 2018-02-22 LAB — CBC WITH DIFFERENTIAL/PLATELET
Basophils Absolute: 0 10*3/uL (ref 0.0–0.2)
Basos: 0 %
EOS (ABSOLUTE): 0 10*3/uL (ref 0.0–0.4)
Eos: 0 %
HEMATOCRIT: 39.5 % (ref 34.0–46.6)
Hemoglobin: 13.2 g/dL (ref 11.1–15.9)
Immature Grans (Abs): 0 10*3/uL (ref 0.0–0.1)
Immature Granulocytes: 0 %
LYMPHS ABS: 2.3 10*3/uL (ref 0.7–3.1)
Lymphs: 23 %
MCH: 28.1 pg (ref 26.6–33.0)
MCHC: 33.4 g/dL (ref 31.5–35.7)
MCV: 84 fL (ref 79–97)
MONOS ABS: 0.4 10*3/uL (ref 0.1–0.9)
Monocytes: 4 %
NEUTROS ABS: 7.1 10*3/uL — AB (ref 1.4–7.0)
Neutrophils: 73 %
Platelets: 292 10*3/uL (ref 150–379)
RBC: 4.7 x10E6/uL (ref 3.77–5.28)
RDW: 14.7 % (ref 12.3–15.4)
WBC: 9.8 10*3/uL (ref 3.4–10.8)

## 2018-02-22 LAB — URINE CULTURE: Organism ID, Bacteria: NO GROWTH

## 2018-02-22 LAB — TSH: TSH: 0.644 u[IU]/mL (ref 0.450–4.500)

## 2018-02-22 LAB — HEMOGLOBIN A1C
Est. average glucose Bld gHb Est-mCnc: 114 mg/dL
HEMOGLOBIN A1C: 5.6 % (ref 4.8–5.6)

## 2018-02-25 ENCOUNTER — Ambulatory Visit: Payer: BLUE CROSS/BLUE SHIELD | Admitting: Women's Health

## 2018-09-01 ENCOUNTER — Other Ambulatory Visit: Payer: Self-pay

## 2018-09-01 ENCOUNTER — Emergency Department (HOSPITAL_COMMUNITY)
Admission: EM | Admit: 2018-09-01 | Discharge: 2018-09-01 | Disposition: A | Discharge status: Home / Self Care | Payer: No Typology Code available for payment source | Attending: Emergency Medicine | Admitting: Emergency Medicine

## 2018-09-01 ENCOUNTER — Emergency Department (HOSPITAL_COMMUNITY): Payer: No Typology Code available for payment source

## 2018-09-01 ENCOUNTER — Encounter (HOSPITAL_COMMUNITY): Payer: Self-pay | Admitting: Emergency Medicine

## 2018-09-01 DIAGNOSIS — R112 Nausea with vomiting, unspecified: Secondary | ICD-10-CM | POA: Insufficient documentation

## 2018-09-01 DIAGNOSIS — Z87891 Personal history of nicotine dependence: Secondary | ICD-10-CM | POA: Insufficient documentation

## 2018-09-01 DIAGNOSIS — G44209 Tension-type headache, unspecified, not intractable: Secondary | ICD-10-CM | POA: Insufficient documentation

## 2018-09-01 DIAGNOSIS — I10 Essential (primary) hypertension: Secondary | ICD-10-CM | POA: Insufficient documentation

## 2018-09-01 DIAGNOSIS — R51 Headache: Secondary | ICD-10-CM | POA: Diagnosis present

## 2018-09-01 DIAGNOSIS — Z86718 Personal history of other venous thrombosis and embolism: Secondary | ICD-10-CM | POA: Diagnosis not present

## 2018-09-01 LAB — COMPREHENSIVE METABOLIC PANEL
ALT: 20 U/L (ref 0–44)
AST: 18 U/L (ref 15–41)
Albumin: 4 g/dL (ref 3.5–5.0)
Alkaline Phosphatase: 57 U/L (ref 38–126)
Anion gap: 9 (ref 5–15)
BILIRUBIN TOTAL: 0.8 mg/dL (ref 0.3–1.2)
BUN: 9 mg/dL (ref 6–20)
CALCIUM: 9.1 mg/dL (ref 8.9–10.3)
CHLORIDE: 105 mmol/L (ref 98–111)
CO2: 24 mmol/L (ref 22–32)
Creatinine, Ser: 0.92 mg/dL (ref 0.44–1.00)
GLUCOSE: 109 mg/dL — AB (ref 70–99)
Potassium: 3 mmol/L — ABNORMAL LOW (ref 3.5–5.1)
Sodium: 138 mmol/L (ref 135–145)
Total Protein: 6.9 g/dL (ref 6.5–8.1)

## 2018-09-01 LAB — I-STAT TROPONIN, ED: Troponin i, poc: 0.01 ng/mL (ref 0.00–0.08)

## 2018-09-01 LAB — CBC
HCT: 40.7 % (ref 36.0–46.0)
Hemoglobin: 14.2 g/dL (ref 12.0–15.0)
MCH: 28.3 pg (ref 26.0–34.0)
MCHC: 34.9 g/dL (ref 30.0–36.0)
MCV: 81.1 fL (ref 78.0–100.0)
PLATELETS: 273 10*3/uL (ref 150–400)
RBC: 5.02 MIL/uL (ref 3.87–5.11)
RDW: 13.2 % (ref 11.5–15.5)
WBC: 11.1 10*3/uL — ABNORMAL HIGH (ref 4.0–10.5)

## 2018-09-01 LAB — DIFFERENTIAL
Abs Immature Granulocytes: 0 10*3/uL (ref 0.0–0.1)
Basophils Absolute: 0 10*3/uL (ref 0.0–0.1)
Basophils Relative: 0 %
EOS ABS: 0.1 10*3/uL (ref 0.0–0.7)
EOS PCT: 0 %
IMMATURE GRANULOCYTES: 0 %
LYMPHS ABS: 1.9 10*3/uL (ref 0.7–4.0)
Lymphocytes Relative: 17 %
MONO ABS: 0.6 10*3/uL (ref 0.1–1.0)
MONOS PCT: 5 %
Neutro Abs: 8.5 10*3/uL — ABNORMAL HIGH (ref 1.7–7.7)
Neutrophils Relative %: 76 %

## 2018-09-01 LAB — I-STAT BETA HCG BLOOD, ED (MC, WL, AP ONLY): I-stat hCG, quantitative: <5 m[IU]/mL (ref <5)

## 2018-09-01 MED ORDER — ONDANSETRON 4 MG PO TBDP: 4.0000 mg | ORAL_TABLET | Freq: Three times a day (TID) | ORAL | 0 refills | Status: DC | PRN

## 2018-09-01 MED ORDER — METOCLOPRAMIDE HCL 5 MG/ML IJ SOLN
10.0000 mg | Freq: Once | INTRAMUSCULAR | Status: AC
  Administered 2018-09-01: 10 mg via INTRAVENOUS
  Filled 2018-09-01: qty 2

## 2018-09-01 MED ORDER — KETOROLAC TROMETHAMINE 30 MG/ML IJ SOLN
30.0000 mg | Freq: Once | INTRAMUSCULAR | Status: AC
  Administered 2018-09-01: 30 mg via INTRAVENOUS
  Filled 2018-09-01: qty 1

## 2018-09-01 MED ORDER — DIPHENHYDRAMINE HCL 50 MG/ML IJ SOLN
25.0000 mg | Freq: Once | INTRAMUSCULAR | Status: AC
  Administered 2018-09-01: 25 mg via INTRAVENOUS
  Filled 2018-09-01: qty 1

## 2018-09-01 MED ORDER — SODIUM CHLORIDE 0.9 % IV BOLUS
500.0000 mL | Freq: Once | INTRAVENOUS | Status: AC
  Administered 2018-09-01: 500 mL via INTRAVENOUS

## 2018-09-01 MED ORDER — IBUPROFEN 800 MG PO TABS: 800.0000 mg | ORAL_TABLET | Freq: Three times a day (TID) | ORAL | 0 refills | Status: DC | PRN

## 2018-09-01 NOTE — Discharge Instructions (Signed)

## 2018-09-01 NOTE — ED Triage Notes (Addendum)
C/o constant frontal headache since Friday with dizziness, nausea, vomiting, and light sensitivity.  States she has never had a headache like this that isn't relived with pain medication and laying down.  No arm drift or neuro deficits noted on triage exam.  States she took 1/2 of BP medication today.  Has not taken BP medication regularly since the earlier part of last week.

## 2018-09-01 NOTE — ED Provider Notes (Addendum)
Emergency Department Provider Note   I have reviewed the triage vital signs and the nursing notes.   HISTORY  Chief Complaint Headache; Dizziness; and Emesis   HPI Rita Harrison is a 37 y.o. female with PMH of HTN and sickle cell anemia presents to the ED with frontal HA for the last 3 days.  She states she has had associated nausea and vomiting along with photophobia.  Denies any fevers or chills.  No sudden onset, maximal intensity headache symptoms.  Patient states she has a history of headaches but this 1 feels different.  No unilateral weakness, numbness, tingling.  No neck discomfort.  No chest pain or shortness of breath.   Past Medical History:  Diagnosis Date  . Anxiety   . Arm DVT (deep venous thromboembolism), chronic (HCC)    RUE  . Chicken pox   . Hypertension   . Sickle cell anemia (HCC)    sickle cell trait    Patient Active Problem List   Diagnosis Date Noted  . Numbness of toes 07/29/2015  . Essential hypertension 07/29/2015    History reviewed. No pertinent surgical history.  Allergies Lisinopril  Family History  Problem Relation Age of Onset  . Sickle cell anemia Father   . Kidney cancer Father   . Breast cancer Maternal Grandmother     Social History Social History   Tobacco Use  . Smoking status: Former Smoker    Packs/day: 0.50    Years: 10.00    Pack years: 5.00    Types: Cigarettes  . Smokeless tobacco: Never Used  Substance Use Topics  . Alcohol use: Yes    Comment: occasionally  . Drug use: Yes    Types: Marijuana    Comment: occasionally    Review of Systems  Constitutional: No fever/chills Eyes: No visual changes. ENT: No sore throat. Cardiovascular: Denies chest pain. Respiratory: Denies shortness of breath. Gastrointestinal: No abdominal pain. Positive nausea and vomiting.  No diarrhea.  No constipation. Genitourinary: Negative for dysuria. Musculoskeletal: Negative for back pain. Skin: Negative for  rash. Neurological: Negative for focal weakness or numbness. Positive HA.   10-point ROS otherwise negative.  ____________________________________________   PHYSICAL EXAM:  VITAL SIGNS: ED Triage Vitals  Enc Vitals Group     BP 09/01/18 1611 (!) 203/111     Pulse Rate 09/01/18 1611 70     Resp 09/01/18 1611 14     Temp 09/01/18 1611 98.2 F (36.8 C)     Temp Source 09/01/18 1611 Oral     SpO2 09/01/18 1611 100 %     Weight 09/01/18 1614 184 lb (83.5 kg)     Height 09/01/18 1614 5\' 8"  (1.727 m)     Pain Score 09/01/18 1618 10   Constitutional: Alert and oriented. Resting in dark room with eyes covered.  Eyes: Conjunctivae are normal.  Head: Atraumatic. Nose: No congestion/rhinnorhea. Mouth/Throat: Mucous membranes are moist.  Neck: No stridor.  Cardiovascular: Normal rate, regular rhythm. Good peripheral circulation. Grossly normal heart sounds.   Respiratory: Normal respiratory effort.  No retractions. Lungs CTAB. Gastrointestinal: Soft and nontender. No distention.  Musculoskeletal: No lower extremity tenderness nor edema. No gross deformities of extremities. Neurologic:  Normal speech and language. No gross focal neurologic deficits are appreciated. Normal CN exam 2-12. No pronator drift.  Skin:  Skin is warm, dry and intact. No rash noted.  ____________________________________________   LABS (all labs ordered are listed, but only abnormal results are displayed)  Labs Reviewed  CBC - Abnormal; Notable for the following components:      Result Value   WBC 11.1 (*)    All other components within normal limits  DIFFERENTIAL - Abnormal; Notable for the following components:   Neutro Abs 8.5 (*)    All other components within normal limits  COMPREHENSIVE METABOLIC PANEL - Abnormal; Notable for the following components:   Potassium 3.0 (*)    Glucose, Bld 109 (*)    All other components within normal limits  I-STAT TROPONIN, ED  I-STAT BETA HCG BLOOD, ED ([facility], [facility], [facility]  ONLY)    ____________________________________________  RADIOLOGY  Ct Head Wo Contrast  Result Date: 09/01/2018 CLINICAL DATA:  37 y/o F; constant frontal headache since Friday with dizziness, nausea, vomiting, and light sensitivity. EXAM: CT HEAD WITHOUT CONTRAST TECHNIQUE: Contiguous axial images were obtained from the base of the skull through the vertex without intravenous contrast. COMPARISON:  09/19/2013 CT head FINDINGS: Brain: No evidence of acute infarction, hemorrhage, hydrocephalus, extra-axial collection or mass lesion/mass effect. Vascular: No hyperdense vessel or unexpected calcification. Skull: Normal. Negative for fracture or focal lesion. Sinuses/Orbits: No acute finding. Other: None. IMPRESSION: No acute intracranial abnormality identified. Stable unremarkable CT of the head. Electronically Signed   By: Mitzi HansenLance  Furusawa-Stratton M.D.   On: 09/01/2018 19:12     EKG:    EKG Interpretation  Date/Time:  Sunday September 01 2018 16:54:58 EDT Ventricular Rate:  67 PR Interval:  184 QRS Duration: 68 QT Interval:  408 QTC Calculation: 431 R Axis:   87 Text Interpretation:  Normal sinus rhythm with sinus arrhythmia Normal ECG Confirmed by Eber HongMiller, Brian ([id number]) on 09/04/2018 10:55:40 AM       ____________________________________________   PROCEDURES  Procedure(s) performed:   Procedures  None ____________________________________________   INITIAL IMPRESSION / ASSESSMENT AND PLAN / ED COURSE  Pertinent labs & imaging results that were available during my care of the patient were reviewed by me and considered in my medical decision making (see chart for details).  Since to the emergency department for evaluation of headache for the past 3 days.  She has associated photophobia with nausea and vomiting.  Abdomen is soft nontender with no abdominal discomfort.  Symptoms seem most consistent with migraine headache but patient has no history of similar headaches in the  past.  No concern for infectious etiology. With vomiting plan for CT head CT, treatment with migraine cocktail. [facility]   CT head negative. Patient HA and nausea resolved with treatment. Plan for discharge with PCP follow up.   At this time, I do not feel there is any life-threatening condition present. I have reviewed and discussed all results (EKG, imaging, lab, urine as appropriate), exam findings with patient. I have reviewed nursing notes and appropriate previous records.  I feel the patient is safe to be discharged home without further emergent workup. Discussed usual and customary return precautions. Patient and family (if present) verbalize understanding and are comfortable with this plan.  Patient will follow-up with their primary care provider. If they do not have a primary care provider, information for follow-up has been provided to them. All questions have been answered.  ____________________________________________  FINAL CLINICAL IMPRESSION(S) / ED DIAGNOSES  Final diagnoses:  Acute non intractable tension-type headache  Non-intractable vomiting with nausea, unspecified vomiting type     MEDICATIONS GIVEN DURING THIS VISIT:  Medications  ketorolac (TORADOL) 30 MG/ML injection 30 mg (30 mg Intravenous Given 09/01/18 1745)  metoCLOPramide (REGLAN) injection  10 mg (10 mg Intravenous Given 09/01/18 1744)  diphenhydrAMINE (BENADRYL) injection 25 mg (25 mg Intravenous Given 09/01/18 1746)  sodium chloride 0.9 % bolus 500 mL (0 mLs Intravenous Stopped 09/01/18 1822)     NEW OUTPATIENT MEDICATIONS STARTED DURING THIS VISIT:  Discharge Medication List as of 09/01/2018  7:53 PM    START taking these medications   Details  ibuprofen (ADVIL,MOTRIN) 800 MG tablet Take 1 tablet (800 mg total) by mouth every 8 (eight) hours as needed., Starting Sun 09/01/2018, Print    ondansetron (ZOFRAN ODT) 4 MG disintegrating tablet Take 1 tablet (4 mg total) by mouth every 8  (eight) hours as needed for nausea or vomiting., Starting Sun 09/01/2018, Print        Note:  This document was prepared using Dragon voice recognition software and may include unintentional dictation errors.  Alona Bene, MD Emergency Medicine    Shalece Staffa, Arlyss Repress, MD 09/02/18 1004    Maia Plan, MD 09/09/18 [phone]

## 2019-01-29 LAB — HM PAP SMEAR

## 2019-03-14 DIAGNOSIS — M2041 Other hammer toe(s) (acquired), right foot: Secondary | ICD-10-CM | POA: Insufficient documentation

## 2019-03-28 ENCOUNTER — Ambulatory Visit (HOSPITAL_COMMUNITY)
Admission: EM | Admit: 2019-03-28 | Discharge: 2019-03-28 | Disposition: A | Discharge status: Home / Self Care | Payer: BLUE CROSS/BLUE SHIELD | Attending: Emergency Medicine | Admitting: Emergency Medicine

## 2019-03-28 ENCOUNTER — Other Ambulatory Visit: Payer: Self-pay

## 2019-03-28 ENCOUNTER — Encounter (HOSPITAL_COMMUNITY): Payer: Self-pay

## 2019-03-28 DIAGNOSIS — J029 Acute pharyngitis, unspecified: Secondary | ICD-10-CM | POA: Insufficient documentation

## 2019-03-28 DIAGNOSIS — I10 Essential (primary) hypertension: Secondary | ICD-10-CM | POA: Diagnosis not present

## 2019-03-28 LAB — POCT RAPID STREP A: Streptococcus, Group A Screen (Direct): NEGATIVE

## 2019-03-28 MED ORDER — LIDOCAINE VISCOUS HCL 2 % MT SOLN: 15.0000 mL | OROMUCOSAL | 0 refills | Status: DC | PRN

## 2019-03-28 NOTE — Discharge Instructions (Signed)
Strep test negative, will send out for culture and we will call you with results Get plenty of rest and push fluids Viscous lidocaine prescribed.  This is an oral solution you can swish, and gargle as needed for symptomatic relief of sore throat.  Do not exceed 8 doses in a 24 hour period.  Do not use prior to eating, as this will numb your entire mouth.   Drink warm or cool liquids, use throat lozenges, or popsicles to help alleviate symptoms Take OTC ibuprofen or tylenol as needed for pain Follow up with PCP or with [facility] if symptoms persists Return or go to ER if patient has any new or worsening symptoms such as fever, chills, nausea, vomiting, worsening sore throat, cough, abdominal pain, chest pain, changes in bowel or bladder habits, etc..Marland Kitchen

## 2019-03-28 NOTE — ED Triage Notes (Signed)
Patient presents to Urgent Care with complaints of sore throat on the right side since the day before yesterday. Patient states she still has her tonsils, has not had a fever. Took ibuprofen last night, nothing for pain today.

## 2019-03-28 NOTE — ED Provider Notes (Signed)
[facility]   [id number] 03/28/19 Arrival Time: [id number]  JX:BJYNCC:SORE THROAT  SUBJECTIVE: History from: patient.  Rita Harrison is a 38 y.o. female hx significant for anxiety, DVT, HTN, and sickle cell trait, who presents with abrupt onset of sore throat x 2 days. Reports son diagnosed with mono.  Denies sick exposure to COVID, flu or strep.  Denies recent travel.  Has tried OTC medications without relief.  Symptoms are made worse with swallowing, but tolerating liquids and own secretions without difficulty.  Denies previous symptoms in the past.   Denies fever, chills, fatigue, ear pain, sinus pain, rhinorrhea, nasal congestion, cough, SOB, wheezing, chest pain, nausea, rash, changes in bowel or bladder habits.    ROS: As per HPI.  Past Medical History:  Diagnosis Date  . Anxiety   . Arm DVT (deep venous thromboembolism), chronic (HCC)    RUE  . Chicken pox   . Hypertension   . Sickle cell anemia (HCC)    sickle cell trait   History reviewed. No pertinent surgical history. Allergies  Allergen Reactions  . Lisinopril Shortness Of Breath and Itching   No current facility-administered medications on file prior to encounter.    Current Outpatient Medications on File Prior to Encounter  Medication Sig Dispense Refill  . hydrochlorothiazide (HYDRODIURIL) 25 MG tablet Take 1 tablet (25 mg total) by mouth daily. Overdue for yearly physical w/labs must see MD for refills 30 tablet 0  . ibuprofen (ADVIL,MOTRIN) 800 MG tablet Take 1 tablet (800 mg total) by mouth every 8 (eight) hours as needed. 21 tablet 0  . ondansetron (ZOFRAN ODT) 4 MG disintegrating tablet Take 1 tablet (4 mg total) by mouth every 8 (eight) hours as needed for nausea or vomiting. 20 tablet 0   Social History   Socioeconomic History  . Marital status: Single    Spouse name: Not on file  . Number of children: 1  . Years of education: 3613  . Highest education level: Not on file  Occupational History  .  Occupation: CNA  Social Needs  . Financial resource strain: Not on file  . Food insecurity:    Worry: Not on file    Inability: Not on file  . Transportation needs:    Medical: Not on file    Non-medical: Not on file  Tobacco Use  . Smoking status: Former Smoker    Packs/day: 0.50    Years: 10.00    Pack years: 5.00    Types: Cigarettes  . Smokeless tobacco: Never Used  Substance and Sexual Activity  . Alcohol use: Yes    Comment: occasionally  . Drug use: Yes    Types: Marijuana    Comment: occasionally  . Sexual activity: Not Currently    Birth control/protection: None  Lifestyle  . Physical activity:    Days per week: Not on file    Minutes per session: Not on file  . Stress: Not on file  Relationships  . Social connections:    Talks on phone: Not on file    Gets together: Not on file    Attends religious service: Not on file    Active member of club or organization: Not on file    Attends meetings of clubs or organizations: Not on file    Relationship status: Not on file  . Intimate partner violence:    Fear of current or ex partner: Not on file    Emotionally abused: Not on file    Physically  abused: Not on file    Forced sexual activity: Not on file  Other Topics Concern  . Not on file  Social History Narrative   Fun: Travel, family, son's football games    Denies religious beliefs effecting health care   Feels safe at home and denies abuse.    Family History  Problem Relation Age of Onset  . Sickle cell anemia Father   . Kidney cancer Father   . Breast cancer Maternal Grandmother     OBJECTIVE:  Vitals:   03/28/19 0952  BP: (!) 166/99  Pulse: 83  Resp: 18  Temp: 98.4 F (36.9 C)  TempSrc: Oral  SpO2: 99%     General appearance: alert; mildly fatigue appearing, nontoxic, speaking in full sentences and managing own secretions HEENT: NCAT; Ears: EACs clear, TMs pearly gray with visible cone of light, without erythema; Eyes: PERRL, EOMI  grossly; Nose: no obvious rhinorrhea; Throat: oropharynx clear, tonsils not enlarged or erythematous, uvula midline; difficulty with opening mouth all the way Neck: supple without LAD Lungs: CTA bilaterally without adventitious breath sounds; cough absent Heart: regular rate and rhythm.  Radial pulses 2+ symmetrical bilaterally Skin: warm and dry Psychological: alert and cooperative; normal mood and affect  LABS: Results for orders placed or performed during the hospital encounter of 03/28/19 (from the past 24 hour(s))  POCT rapid strep A [facility])     Status: None   Collection Time: 03/28/19 10:31 AM  Result Value Ref Range   Streptococcus, Group A Screen (Direct) NEGATIVE NEGATIVE     ASSESSMENT & PLAN:  1. Viral pharyngitis     Meds ordered this encounter  Medications  . lidocaine (XYLOCAINE) 2 % solution    Sig: Use as directed 15 mLs in the mouth or throat as needed for mouth pain.    Dispense:  100 mL    Refill:  0    Order Specific Question:   Supervising Provider    Answer:   Eustace Moore [[id number]]   Strep test negative, will send out for culture and we will call you with results Get plenty of rest and push fluids Viscous lidocaine prescribed.  This is an oral solution you can swish, and gargle as needed for symptomatic relief of sore throat.  Do not exceed 8 doses in a 24 hour period.  Do not use prior to eating, as this will numb your entire mouth.   Drink warm or cool liquids, use throat lozenges, or popsicles to help alleviate symptoms Take OTC ibuprofen or tylenol as needed for pain Follow up with PCP or with [facility] if symptoms persists Return or go to ER if patient has any new or worsening symptoms such as fever, chills, nausea, vomiting, worsening sore throat, cough, abdominal pain, chest pain, changes in bowel or bladder habits, etc...  Reviewed expectations re: course of current medical issues. Questions answered. Outlined signs and  symptoms indicating need for more acute intervention. Patient verbalized understanding. After Visit Summary given.        Rennis Harding, PA-C 03/28/19 1100

## 2019-03-30 ENCOUNTER — Other Ambulatory Visit: Payer: Self-pay

## 2019-03-30 ENCOUNTER — Ambulatory Visit (HOSPITAL_COMMUNITY)
Admission: EM | Admit: 2019-03-30 | Discharge: 2019-03-30 | Disposition: A | Discharge status: Home / Self Care | Payer: BLUE CROSS/BLUE SHIELD | Attending: Urgent Care | Admitting: Urgent Care

## 2019-03-30 ENCOUNTER — Encounter (HOSPITAL_COMMUNITY): Payer: Self-pay | Admitting: *Deleted

## 2019-03-30 DIAGNOSIS — I10 Essential (primary) hypertension: Secondary | ICD-10-CM

## 2019-03-30 DIAGNOSIS — B349 Viral infection, unspecified: Secondary | ICD-10-CM | POA: Diagnosis not present

## 2019-03-30 DIAGNOSIS — J029 Acute pharyngitis, unspecified: Secondary | ICD-10-CM

## 2019-03-30 LAB — CULTURE, GROUP A STREP (THRC)

## 2019-03-30 LAB — POCT RAPID STREP A: Streptococcus, Group A Screen (Direct): NEGATIVE

## 2019-03-30 NOTE — ED Provider Notes (Addendum)
MRN: [id number] DOB: 24-Aug-1981  Subjective:   Rita Harrison is a 38 y.o. female presenting for 5 day history of persistent worsening throbbing throat pain with difficulty swallowing, feels like her throat is swollen. She has had difficulty eating. Has tried APAP with minimal relief. She is nauseated from taking APAP, Xylocaine solution for throat pain.  Denies smoking cigarettes.   No current facility-administered medications for this encounter.   Current Outpatient Medications:  .  hydrochlorothiazide (HYDRODIURIL) 25 MG tablet, Take 1 tablet (25 mg total) by mouth daily. Overdue for yearly physical w/labs must see MD for refills, Disp: 30 tablet, Rfl: 0 .  ibuprofen (ADVIL,MOTRIN) 800 MG tablet, Take 1 tablet (800 mg total) by mouth every 8 (eight) hours as needed., Disp: 21 tablet, Rfl: 0 .  lidocaine (XYLOCAINE) 2 % solution, Use as directed 15 mLs in the mouth or throat as needed for mouth pain., Disp: 100 mL, Rfl: 0 .  ondansetron (ZOFRAN ODT) 4 MG disintegrating tablet, Take 1 tablet (4 mg total) by mouth every 8 (eight) hours as needed for nausea or vomiting., Disp: 20 tablet, Rfl: 0    Allergies  Allergen Reactions  . Lisinopril Shortness Of Breath and Itching    Past Medical History:  Diagnosis Date  . Anxiety   . Arm DVT (deep venous thromboembolism), chronic (HCC)    RUE  . Chicken pox   . Hypertension   . Sickle cell anemia (HCC)    sickle cell trait     History reviewed. No pertinent surgical history.   Review of Systems  Constitutional: Negative for fever and malaise/fatigue.  HENT: Positive for congestion (intermittent at night time) and sore throat. Negative for ear pain and sinus pain.        Hurts to blow her nose.   Eyes: Negative for blurred vision, double vision, discharge and redness.  Respiratory: Negative for cough, hemoptysis, shortness of breath and wheezing.   Cardiovascular: Negative for chest pain.  Gastrointestinal: Negative for abdominal  pain, diarrhea, nausea and vomiting.  Genitourinary: Negative for dysuria, flank pain and hematuria.  Musculoskeletal: Negative for myalgias.  Skin: Negative for rash.  Neurological: Negative for weakness and headaches.  Psychiatric/Behavioral: Negative for depression and substance abuse.    Objective:   Vitals: BP (!) 149/98   Pulse 93   Temp 98.6 F (37 C) (Oral)   Resp 16   LMP 03/21/2019 (Exact Date)   SpO2 100%   Physical Exam Constitutional:      General: She is not in acute distress.    Appearance: She is well-developed. She is not ill-appearing.  HENT:     Head: Normocephalic and atraumatic.     Right Ear: Tympanic membrane and ear canal normal. No drainage or tenderness. No middle ear effusion. Tympanic membrane is not erythematous.     Left Ear: Tympanic membrane and ear canal normal. No drainage or tenderness.  No middle ear effusion. Tympanic membrane is not erythematous.     Nose: No congestion or rhinorrhea.     Mouth/Throat:     Mouth: Mucous membranes are moist. No oral lesions.     Pharynx: Oropharynx is clear. No pharyngeal swelling, oropharyngeal exudate, posterior oropharyngeal erythema or uvula swelling.     Tonsils: No tonsillar exudate or tonsillar abscesses.  Eyes:     General: No scleral icterus.    Extraocular Movements:     Right eye: Normal extraocular motion.     Left eye: Normal extraocular motion.  Conjunctiva/sclera: Conjunctivae normal.     Pupils: Pupils are equal, round, and reactive to light.  Neck:     Musculoskeletal: Normal range of motion and neck supple.  Cardiovascular:     Rate and Rhythm: Normal rate.  Pulmonary:     Effort: Pulmonary effort is normal.  Lymphadenopathy:     Cervical: No cervical adenopathy.  Skin:    General: Skin is warm and dry.  Neurological:     General: No focal deficit present.     Mental Status: She is alert and oriented to person, place, and time.  Psychiatric:        Mood and Affect: Mood  normal.        Behavior: Behavior normal.    Results for orders placed or performed during the hospital encounter of 03/30/19 (from the past 24 hour(s))  POCT rapid strep A [facility])     Status: None   Collection Time: 03/30/19  4:36 PM  Result Value Ref Range   Streptococcus, Group A Screen (Direct) NEGATIVE NEGATIVE    Assessment and Plan :   Viral syndrome  Essential hypertension  Likely viral in etiology d/t physical exam findings, throat culture pending.  Counseled patient on possibility of coronavirus and inability to rule this out.  Advised supportive care, offered symptomatic relief.  Recommended patient stop using Xylocaine oral solution as it is causing her nausea.  She is to monitor her blood pressure at home and follow-up with her PCP.  Counseled patient on potential for adverse effects with medications prescribed today, patient verbalized understanding including risks of using medications recommended despite not knowing if she does not fact have coronavirus.  ER and return-to-clinic precautions discussed, patient verbalized understanding.     Wallis Bamberg, PA-C 03/30/19 1638    Wallis Bamberg, PA-C 03/30/19 [phone]

## 2019-03-30 NOTE — ED Triage Notes (Signed)
Pt seen in [facility] 4/10 - neg strep.  States returned today today because throat feeling worse.  Continues without fevers.  C/O difficulty swallowing, feeling throat is swollen.

## 2019-03-30 NOTE — Discharge Instructions (Addendum)
We will manage this as a viral syndrome. For sore throat or cough try using a honey-based tea. Use 3 teaspoons of honey with juice squeezed from half lemon. Place shaved pieces of ginger into 1/2-1 cup of water and warm over stove top. Then mix the ingredients and repeat every 4 hours as needed. Please take ibuprofen 400mg every 6 hours alternating with OR taken together with Tylenol 500mg every 6 hours. Hydrate very well with at least 2 liters of water. Eat light meals such as soups to replenish electrolytes and soft fruits, veggies. Start an antihistamine like Zyrtec, Allegra or Claritin for postnasal drainage, sinus congestion. 

## 2019-04-01 ENCOUNTER — Ambulatory Visit (HOSPITAL_COMMUNITY)
Admission: EM | Admit: 2019-04-01 | Discharge: 2019-04-01 | Disposition: A | Discharge status: Home / Self Care | Payer: BLUE CROSS/BLUE SHIELD | Attending: Family Medicine | Admitting: Family Medicine

## 2019-04-01 ENCOUNTER — Other Ambulatory Visit: Payer: Self-pay

## 2019-04-01 ENCOUNTER — Encounter (HOSPITAL_COMMUNITY): Payer: Self-pay | Admitting: Emergency Medicine

## 2019-04-01 DIAGNOSIS — J36 Peritonsillar abscess: Secondary | ICD-10-CM | POA: Diagnosis not present

## 2019-04-01 MED ORDER — DEXAMETHASONE 10 MG/ML FOR PEDIATRIC ORAL USE
INTRAMUSCULAR | Status: AC
  Filled 2019-04-01: qty 1

## 2019-04-01 MED ORDER — DEXAMETHASONE SODIUM PHOSPHATE 10 MG/ML IJ SOLN: 10.0000 mg | Freq: Once | INTRAMUSCULAR | Status: DC

## 2019-04-01 MED ORDER — AMOXICILLIN-POT CLAVULANATE 875-125 MG PO TABS: 1.0000 | ORAL_TABLET | Freq: Two times a day (BID) | ORAL | 0 refills | Status: DC

## 2019-04-01 NOTE — Discharge Instructions (Signed)
We are treating you for a peritonsillar abscess I am sending you home with Augmentin to take twice a day for the next 7 days Steroid injection given here in clinic Closely monitor your symptoms over the next 24 to 48 hours.  If your symptoms worsen despite treatment you need to go to the hospital

## 2019-04-01 NOTE — ED Triage Notes (Signed)
Pt seen here 4/10 and 4/12 for sore throat, c/o ongoing pain.

## 2019-04-02 NOTE — ED Provider Notes (Signed)
[facility]    CSN: [id number] Arrival date & time: 04/01/19  1109     History   Chief Complaint Chief Complaint  Patient presents with  . Sore Throat    HPI Rita Harrison is a 38 y.o. female.   Patient is a 38 year old female that presents today with continued sore throat.  She has been seen here twice for the same and diagnosed with viral illness.  She reports that this sore throat has progressively gotten worse.  She is having trouble swallowing secretions, fluids and she has not eaten 3 to 4 days.  She has been doing warm salt water gargles which somewhat helps.  Unsure of any fevers.  No drooling or trismus.  ROS per HPI      Past Medical History:  Diagnosis Date  . Anxiety   . Arm DVT (deep venous thromboembolism), chronic (HCC)    RUE  . Chicken pox   . Hypertension   . Sickle cell anemia (HCC)    sickle cell trait    Patient Active Problem List   Diagnosis Date Noted  . Numbness of toes 07/29/2015  . Essential hypertension 07/29/2015    History reviewed. No pertinent surgical history.  OB History   No obstetric history on file.      Home Medications    Prior to Admission medications   Medication Sig Start Date End Date Taking? Authorizing Provider  amoxicillin-clavulanate (AUGMENTIN) 875-125 MG tablet Take 1 tablet by mouth every 12 (twelve) hours. 04/01/19   Dahlia Byes A, NP  hydrochlorothiazide (HYDRODIURIL) 25 MG tablet Take 1 tablet (25 mg total) by mouth daily. Overdue for yearly physical w/labs must see MD for refills 08/04/16   Veryl Speak, FNP  ibuprofen (ADVIL,MOTRIN) 800 MG tablet Take 1 tablet (800 mg total) by mouth every 8 (eight) hours as needed. 09/01/18   Long, Arlyss Repress, MD  lidocaine (XYLOCAINE) 2 % solution Use as directed 15 mLs in the mouth or throat as needed for mouth pain. 03/28/19   Wurst, [place], PA-C  ondansetron (ZOFRAN ODT) 4 MG disintegrating tablet Take 1 tablet (4 mg total) by mouth every 8 (eight)  hours as needed for nausea or vomiting. 09/01/18   Long, Arlyss Repress, MD    Family History Family History  Problem Relation Age of Onset  . Sickle cell anemia Father   . Kidney cancer Father   . Breast cancer Maternal Grandmother     Social History Social History   Tobacco Use  . Smoking status: Former Smoker    Packs/day: 0.50    Years: 10.00    Pack years: 5.00    Types: Cigarettes  . Smokeless tobacco: Never Used  Substance Use Topics  . Alcohol use: Yes    Comment: occasionally  . Drug use: Yes    Types: Marijuana    Comment: occasionally     Allergies   Lisinopril   Review of Systems Review of Systems   Physical Exam Triage Vital Signs ED Triage Vitals [04/01/19 1134]  Enc Vitals Group     BP (!) 165/97     Pulse Rate (!) 115     Resp 16     Temp 98 F (36.7 C)     Temp src      SpO2 100 %     Weight      Height      Head Circumference      Peak Flow      Pain  Score 8     Pain Loc      Pain Edu?      Excl. in GC?    No data found.  Updated Vital Signs BP (!) 165/97   Pulse (!) 115   Temp 98 F (36.7 C)   Resp 16   LMP 03/21/2019 (Exact Date)   SpO2 100%   Visual Acuity Right Eye Distance:   Left Eye Distance:   Bilateral Distance:    Right Eye Near:   Left Eye Near:    Bilateral Near:     Physical Exam Vitals signs and nursing note reviewed.  Constitutional:      General: She is not in acute distress.    Appearance: She is well-developed. She is not ill-appearing, toxic-appearing or diaphoretic.  HENT:     Head: Normocephalic and atraumatic.     Right Ear: Tympanic membrane and ear canal normal.     Left Ear: Tympanic membrane and ear canal normal.     Mouth/Throat:     Tonsils: Tonsillar abscess present. 3+ on the right.     Comments: Right tonsillar abscess with exudate.  Slight uvula deviation. Neck:     Musculoskeletal: Normal range of motion.  Cardiovascular:     Rate and Rhythm: Regular rhythm. Tachycardia present.      Heart sounds: Normal heart sounds.  Pulmonary:     Effort: Pulmonary effort is normal.     Breath sounds: Normal breath sounds.  Lymphadenopathy:     Cervical: Cervical adenopathy present.  Skin:    General: Skin is warm and dry.  Neurological:     Mental Status: She is alert.  Psychiatric:        Mood and Affect: Mood normal.      UC Treatments / Results  Labs (all labs ordered are listed, but only abnormal results are displayed) Labs Reviewed - No data to display  EKG None  Radiology No results found.  Procedures Procedures (including critical care time)  Medications Ordered in UC Medications - No data to display  Initial Impression / Assessment and Plan / UC Course  I have reviewed the triage vital signs and the nursing notes.  Pertinent labs & imaging results that were available during my care of the patient were reviewed by me and considered in my medical decision making (see chart for details).    Peritonsillar abscess  Patient with symptoms consistent with peritonsillar abscess. She is otherwise stable and I feel appropriate for outpatient treatment. We will treat with dexamethasone injection here in clinic and Augmentin twice a day for the next 7 days Very strict precautions that if her symptoms continue or worsen despite treatment she will need to go to the ER Patient understanding and agree to plan Final Clinical Impressions(s) / UC Diagnoses   Final diagnoses:  Peritonsillar abscess     Discharge Instructions     We are treating you for a peritonsillar abscess I am sending you home with Augmentin to take twice a day for the next 7 days Steroid injection given here in clinic Closely monitor your symptoms over the next 24 to 48 hours.  If your symptoms worsen despite treatment you need to go to the hospital    ED Prescriptions    Medication Sig Dispense Auth. Provider   amoxicillin-clavulanate (AUGMENTIN) 875-125 MG tablet Take 1 tablet by  mouth every 12 (twelve) hours. 14 tablet Dahlia ByesBast, Shogo Larkey A, NP     Controlled Substance Prescriptions [place] Controlled Substance Registry  consulted? Not Applicable   Janace Aris, NP 04/02/19 [phone]

## 2019-04-03 LAB — CULTURE, GROUP A STREP (THRC)

## 2019-10-20 ENCOUNTER — Other Ambulatory Visit: Payer: Self-pay

## 2019-10-20 DIAGNOSIS — Z20822 Contact with and (suspected) exposure to covid-19: Secondary | ICD-10-CM

## 2019-10-21 LAB — NOVEL CORONAVIRUS, NAA: SARS-CoV-2, NAA: NOT DETECTED

## 2019-12-27 ENCOUNTER — Ambulatory Visit
Admission: EM | Admit: 2019-12-27 | Discharge: 2019-12-27 | Discharge status: Against Medical Advice | Payer: BC Managed Care – PPO

## 2019-12-27 ENCOUNTER — Other Ambulatory Visit: Payer: Self-pay

## 2019-12-29 ENCOUNTER — Ambulatory Visit: Payer: BC Managed Care – PPO | Attending: Internal Medicine

## 2019-12-29 DIAGNOSIS — Z20822 Contact with and (suspected) exposure to covid-19: Secondary | ICD-10-CM

## 2019-12-31 LAB — NOVEL CORONAVIRUS, NAA: SARS-CoV-2, NAA: NOT DETECTED

## 2020-06-11 ENCOUNTER — Other Ambulatory Visit: Payer: Self-pay | Admitting: Physician Assistant

## 2020-06-11 DIAGNOSIS — Z1231 Encounter for screening mammogram for malignant neoplasm of breast: Secondary | ICD-10-CM

## 2020-06-22 ENCOUNTER — Other Ambulatory Visit: Payer: Self-pay

## 2020-06-22 ENCOUNTER — Ambulatory Visit
Admission: RE | Admit: 2020-06-22 | Discharge: 2020-06-22 | Disposition: A | Discharge status: Home / Self Care | Payer: 59 | Source: Ambulatory Visit | Attending: Physician Assistant | Admitting: Physician Assistant

## 2020-06-22 DIAGNOSIS — Z1231 Encounter for screening mammogram for malignant neoplasm of breast: Secondary | ICD-10-CM

## 2020-06-22 LAB — HM MAMMOGRAPHY

## 2020-07-06 ENCOUNTER — Other Ambulatory Visit (HOSPITAL_COMMUNITY): Payer: Self-pay | Admitting: Physician Assistant

## 2020-07-09 DIAGNOSIS — L219 Seborrheic dermatitis, unspecified: Secondary | ICD-10-CM | POA: Diagnosis not present

## 2020-07-09 DIAGNOSIS — L299 Pruritus, unspecified: Secondary | ICD-10-CM | POA: Diagnosis not present

## 2020-08-27 MED FILL — HYDROCHLOROTHIAZIDE 25 MG T: 25 | 90 days supply | Qty: 90 | Fill #0

## 2020-10-12 ENCOUNTER — Other Ambulatory Visit (HOSPITAL_COMMUNITY): Payer: Self-pay | Admitting: Dermatology

## 2020-10-12 DIAGNOSIS — L299 Pruritus, unspecified: Secondary | ICD-10-CM | POA: Diagnosis not present

## 2020-10-12 DIAGNOSIS — L219 Seborrheic dermatitis, unspecified: Secondary | ICD-10-CM | POA: Diagnosis not present

## 2020-10-12 DIAGNOSIS — I10 Essential (primary) hypertension: Secondary | ICD-10-CM | POA: Diagnosis not present

## 2020-10-12 MED FILL — KETOCONAZOLE 2% SHAMPOO: 2 | 30 days supply | Qty: 120 | Fill #0

## 2020-11-09 ENCOUNTER — Ambulatory Visit: Payer: 59 | Admitting: Internal Medicine

## 2020-11-09 ENCOUNTER — Other Ambulatory Visit: Payer: Self-pay

## 2020-11-09 ENCOUNTER — Encounter: Payer: Self-pay | Admitting: Internal Medicine

## 2020-11-09 ENCOUNTER — Other Ambulatory Visit: Payer: Self-pay | Admitting: Internal Medicine

## 2020-11-09 VITALS — BP 160/100 | HR 89 | Temp 98.1°F | Resp 16 | Ht 68.0 in | Wt 183.0 lb

## 2020-11-09 DIAGNOSIS — I10 Essential (primary) hypertension: Secondary | ICD-10-CM | POA: Diagnosis not present

## 2020-11-09 DIAGNOSIS — Z0001 Encounter for general adult medical examination with abnormal findings: Secondary | ICD-10-CM

## 2020-11-09 DIAGNOSIS — E559 Vitamin D deficiency, unspecified: Secondary | ICD-10-CM | POA: Diagnosis not present

## 2020-11-09 DIAGNOSIS — E785 Hyperlipidemia, unspecified: Secondary | ICD-10-CM | POA: Diagnosis not present

## 2020-11-09 DIAGNOSIS — I82721 Chronic embolism and thrombosis of deep veins of right upper extremity: Secondary | ICD-10-CM

## 2020-11-09 DIAGNOSIS — I82729 Chronic embolism and thrombosis of deep veins of unspecified upper extremity: Secondary | ICD-10-CM | POA: Insufficient documentation

## 2020-11-09 DIAGNOSIS — E876 Hypokalemia: Secondary | ICD-10-CM | POA: Diagnosis not present

## 2020-11-09 HISTORY — DX: Encounter for general adult medical examination with abnormal findings: Z00.01

## 2020-11-09 MED ORDER — NEBIVOLOL HCL 5 MG PO TABS: 5.0000 mg | ORAL_TABLET | Freq: Every day | ORAL | 0 refills | Status: DC

## 2020-11-09 MED ORDER — INDAPAMIDE 2.5 MG PO TABS: 2.5000 mg | ORAL_TABLET | Freq: Every day | ORAL | 0 refills | Status: DC

## 2020-11-09 NOTE — Progress Notes (Signed)
Subjective:  Patient ID: Rita Harrison, female    DOB: 13-Nov-1981  Age: 39 y.o. MRN: [id number]  CC: Hypertension and Annual Exam  This visit occurred during the SARS-CoV-2 public health emergency.  Safety protocols were in place, including screening questions prior to the visit, additional usage of staff PPE, and extensive cleaning of exam room while observing appropriate contact time as indicated for disinfecting solutions.    HPI Rita Harrison presents for a CPX and establish care.  She is concerned that her blood pressure is not adequately well controlled.  She has been monitoring it and says it has been too high.  She has had some discomfort in her left eye.  She has a history of hypertension and has been treated with hydrochlorothiazide.  She denies headache, blurred vision, chest pain, shortness of breath, palpitations, edema, or fatigue.  She tells me that about 4 years ago she had a deep venous thrombosis in her right upper arm.  She says there was no precipitating event although she does have shrapnel in her right arm.  She denies any recent episodes of pain or swelling in her upper extremities.  She has a large lump in the middle of her right humerus that has not changed recently.  She tells me there is a history of hypercoagulability in her family.  She thinks her father died from DVT PE.  She has irregular menses and said she has been told that she is experiencing early menopause.  Her last menstrual cycle was about 5 weeks ago.  History Sophia has a past medical history of Anxiety, Arm DVT (deep venous thromboembolism), chronic (HCC), Chicken pox, Hypertension, and Sickle cell anemia (HCC).   She has no past surgical history on file.   Her family history includes Breast cancer in her maternal grandmother; Kidney cancer in her father; Sickle cell anemia in her father.She reports that she has been smoking cigarettes. She has a 5.00 pack-year smoking history. She has never  used smokeless tobacco. She reports previous alcohol use. She reports current drug use. Drug: Marijuana.  Outpatient Medications Prior to Visit  Medication Sig Dispense Refill  . amoxicillin-clavulanate (AUGMENTIN) 875-125 MG tablet Take 1 tablet by mouth every 12 (twelve) hours. 14 tablet 0  . hydrochlorothiazide (HYDRODIURIL) 25 MG tablet Take 1 tablet (25 mg total) by mouth daily. Overdue for yearly physical w/labs must see MD for refills 30 tablet 0  . ibuprofen (ADVIL,MOTRIN) 800 MG tablet Take 1 tablet (800 mg total) by mouth every 8 (eight) hours as needed. 21 tablet 0  . lidocaine (XYLOCAINE) 2 % solution Use as directed 15 mLs in the mouth or throat as needed for mouth pain. 100 mL 0  . ondansetron (ZOFRAN ODT) 4 MG disintegrating tablet Take 1 tablet (4 mg total) by mouth every 8 (eight) hours as needed for nausea or vomiting. 20 tablet 0   No facility-administered medications prior to visit.    ROS Review of Systems  Constitutional: Negative for appetite change, diaphoresis, fatigue and unexpected weight change.  HENT: Negative.  Negative for sore throat and trouble swallowing.   Eyes: Positive for pain. Negative for redness and visual disturbance.  Respiratory: Negative for apnea, cough, shortness of breath and wheezing.   Cardiovascular: Negative for chest pain and leg swelling.  Gastrointestinal: Negative for abdominal pain, constipation, diarrhea, nausea and vomiting.  Endocrine: Negative.   Genitourinary: Negative.  Negative for difficulty urinating.  Musculoskeletal: Negative for arthralgias, back pain, myalgias and neck pain.  Skin: Negative for color change and rash.  Neurological: Negative for dizziness, weakness, light-headedness, numbness and headaches.  Hematological: Negative for adenopathy. Does not bruise/bleed easily.  Psychiatric/Behavioral: Negative.     Objective:  BP (!) 160/100   Pulse 89   Temp 98.1 F (36.7 C) (Oral)   Resp 16   Ht 5\' 8"  (1.727 m)    Wt 183 lb (83 kg)   LMP 10/01/2020   SpO2 98%   BMI 27.83 kg/m   Physical Exam Vitals reviewed.  Constitutional:      Appearance: Normal appearance.  HENT:     Nose: Nose normal.     Mouth/Throat:     Mouth: Mucous membranes are moist.  Eyes:     General: No scleral icterus.    Conjunctiva/sclera: Conjunctivae normal.  Cardiovascular:     Rate and Rhythm: Normal rate and regular rhythm.     Heart sounds: No murmur heard.      Comments: EKG- NSR, 70 bpm T wave changes are not new No LVH or Q waves Pulmonary:     Effort: Pulmonary effort is normal.     Breath sounds: No stridor. No wheezing, rhonchi or rales.  Abdominal:     General: Abdomen is flat. Bowel sounds are normal. There is no distension.     Palpations: Abdomen is soft. There is no hepatomegaly, splenomegaly or mass.     Tenderness: There is no abdominal tenderness.  Musculoskeletal:        General: Normal range of motion.       Arms:     Cervical back: Neck supple.     Right lower leg: No edema.     Left lower leg: No edema.  Lymphadenopathy:     Cervical: No cervical adenopathy.  Skin:    General: Skin is warm and dry.  Neurological:     General: No focal deficit present.     Mental Status: She is alert and oriented to person, place, and time. Mental status is at baseline.  Psychiatric:        Mood and Affect: Mood normal.        Behavior: Behavior normal.     Lab Results  Component Value Date   WBC 10.8 (H) 11/09/2020   HGB 13.8 11/09/2020   HCT 40.0 11/09/2020   PLT 285.0 11/09/2020   GLUCOSE 84 11/09/2020   CHOL 180 11/09/2020   TRIG 82.0 11/09/2020   HDL 41.30 11/09/2020   LDLCALC 122 (H) 11/09/2020   ALT 20 09/01/2018   AST 18 09/01/2018   NA 139 11/09/2020   K 3.0 (L) 11/09/2020   CL 101 11/09/2020   CREATININE 1.03 11/09/2020   BUN 19 11/09/2020   CO2 31 11/09/2020   TSH 1.38 11/09/2020   INR 1.08 04/23/2013   HGBA1C 5.6 02/21/2018    Assessment & Plan:   Evelene CroonDeritra was  seen today for hypertension and annual exam.  Diagnoses and all orders for this visit:  Essential hypertension- Her blood pressure is not adequately well controlled and she is symptomatic.  She also has mild hypokalemia.  Screening for hyperaldosteronism is negative.  Labs are negative for secondary causes or endorgan damage.  I recommended that she treat the hypertension with nebivolol, triamterene, and hydrochlorothiazide. -     EKG 12-Lead -     Urinalysis, Routine w reflex microscopic; Future -     TSH; Future -     VITAMIN D 25 Hydroxy (Vit-D Deficiency, Fractures); Future -  CBC with Differential/Platelet; Future -     Basic metabolic panel; Future -     Magnesium; Future -     Aldosterone + renin activity w/ ratio; Future -     hCG, quantitative, pregnancy; Future -     nebivolol (BYSTOLIC) 5 MG tablet; Take 1 tablet (5 mg total) by mouth daily. -     Discontinue: indapamide (LOZOL) 2.5 MG tablet; Take 1 tablet (2.5 mg total) by mouth daily. -     hCG, quantitative, pregnancy -     Aldosterone + renin activity w/ ratio -     Magnesium -     Basic metabolic panel -     CBC with Differential/Platelet -     VITAMIN D 25 Hydroxy (Vit-D Deficiency, Fractures) -     TSH -     Urinalysis, Routine w reflex microscopic -     triamterene-hydrochlorothiazide (DYAZIDE) 37.5-25 MG capsule; Take 1 each (1 capsule total) by mouth daily.  Encounter for general adult medical examination with abnormal findings- Exam completed, labs reviewed, vaccines reviewed and updated, cancer screenings are up-to-date, patient education material was given. -     Lipid panel; Future -     Lipid panel  Chronic embolism and thrombosis of deep vein of right upper extremity (HCC)- There is no evidence of recurrent thrombosis.  I will screen her for hypercoagulability. -     D-dimer, quantitative (not at [facility]); Future -     Hypercoagulable panel, comprehensive; Future -     Hypercoagulable panel,  comprehensive -     D-dimer, quantitative (not at [facility])  Chronic hypokalemia -     triamterene-hydrochlorothiazide (DYAZIDE) 37.5-25 MG capsule; Take 1 each (1 capsule total) by mouth daily.  Vitamin D deficiency disease -     Cholecalciferol 1.25 MG (50000 UT) capsule; Take 1 capsule (50,000 Units total) by mouth once a week.   I have discontinued Charizma L. Schechter's hydrochlorothiazide, ibuprofen, ondansetron, lidocaine, amoxicillin-clavulanate, and indapamide. I am also having her start on nebivolol, triamterene-hydrochlorothiazide, and Cholecalciferol.  Meds ordered this encounter  Medications  . nebivolol (BYSTOLIC) 5 MG tablet    Sig: Take 1 tablet (5 mg total) by mouth daily.    Dispense:  90 tablet    Refill:  0  . DISCONTD: indapamide (LOZOL) 2.5 MG tablet    Sig: Take 1 tablet (2.5 mg total) by mouth daily.    Dispense:  90 tablet    Refill:  0  . triamterene-hydrochlorothiazide (DYAZIDE) 37.5-25 MG capsule    Sig: Take 1 each (1 capsule total) by mouth daily.    Dispense:  90 capsule    Refill:  0  . Cholecalciferol 1.25 MG (50000 UT) capsule    Sig: Take 1 capsule (50,000 Units total) by mouth once a week.    Dispense:  12 capsule    Refill:  1   In addition to time spent on CPE, I spent 50 minutes in preparing to see the patient by review of recent labs, imaging and procedures, obtaining and reviewing separately obtained history, communicating with the patient and family or caregiver, ordering medications, tests or procedures, and documenting clinical information in the EHR including the differential Dx, treatment, and any further evaluation and other management of 1. Essential hypertension 2. Chronic embolism and thrombosis of deep vein of right upper extremity (HCC) 3. Chronic hypokalemia 4. Vitamin D deficiency disease 5. Hyperlipidemia with target LDL less than 130     Follow-up: Return in about  6 weeks (around 12/21/2020).  Sanda Linger, MD

## 2020-11-09 NOTE — Patient Instructions (Signed)

## 2020-11-10 LAB — BASIC METABOLIC PANEL
BUN: 19 mg/dL (ref 6–23)
CO2: 31 mEq/L (ref 19–32)
Calcium: 9.5 mg/dL (ref 8.4–10.5)
Chloride: 101 mEq/L (ref 96–112)
Creatinine, Ser: 1.03 mg/dL (ref 0.40–1.20)
GFR: 67.45 mL/min (ref >60.00)
Glucose, Bld: 84 mg/dL (ref 70–99)
Potassium: 3 mEq/L — ABNORMAL LOW (ref 3.5–5.1)
Sodium: 139 mEq/L (ref 135–145)

## 2020-11-10 LAB — LIPID PANEL
Cholesterol: 180 mg/dL (ref 0–200)
HDL: 41.3 mg/dL (ref >39.00)
LDL Cholesterol: 122 mg/dL — ABNORMAL HIGH (ref 0–99)
NonHDL: 138.4
Total CHOL/HDL Ratio: 4
Triglycerides: 82 mg/dL (ref 0.0–149.0)
VLDL: 16.4 mg/dL (ref 0.0–40.0)

## 2020-11-10 LAB — VITAMIN D 25 HYDROXY (VIT D DEFICIENCY, FRACTURES): VITD: 15.61 ng/mL — ABNORMAL LOW (ref 30.00–100.00)

## 2020-11-10 LAB — CBC WITH DIFFERENTIAL/PLATELET
Basophils Absolute: 0.1 10*3/uL (ref 0.0–0.1)
Basophils Relative: 1.3 % (ref 0.0–3.0)
Eosinophils Absolute: 0.1 10*3/uL (ref 0.0–0.7)
Eosinophils Relative: 0.7 % (ref 0.0–5.0)
HCT: 40 % (ref 36.0–46.0)
Hemoglobin: 13.8 g/dL (ref 12.0–15.0)
Lymphocytes Relative: 29.7 % (ref 12.0–46.0)
Lymphs Abs: 3.2 10*3/uL (ref 0.7–4.0)
MCHC: 34.6 g/dL (ref 30.0–36.0)
MCV: 81.2 fl (ref 78.0–100.0)
Monocytes Absolute: 0.5 10*3/uL (ref 0.1–1.0)
Monocytes Relative: 4.5 % (ref 3.0–12.0)
Neutro Abs: 6.9 10*3/uL (ref 1.4–7.7)
Neutrophils Relative %: 63.8 % (ref 43.0–77.0)
Platelets: 285 10*3/uL (ref 150.0–400.0)
RBC: 4.92 Mil/uL (ref 3.87–5.11)
RDW: 13.8 % (ref 11.5–15.5)
WBC: 10.8 10*3/uL — ABNORMAL HIGH (ref 4.0–10.5)

## 2020-11-10 LAB — TSH: TSH: 1.38 u[IU]/mL (ref 0.35–4.50)

## 2020-11-10 LAB — MAGNESIUM: Magnesium: 2.3 mg/dL (ref 1.5–2.5)

## 2020-11-10 LAB — URINALYSIS, ROUTINE W REFLEX MICROSCOPIC
Bilirubin Urine: NEGATIVE
Hgb urine dipstick: NEGATIVE
Ketones, ur: NEGATIVE
Nitrite: NEGATIVE
RBC / HPF: NONE SEEN (ref 0–?)
Specific Gravity, Urine: >=1.03 — AB (ref 1.000–1.030)
Total Protein, Urine: NEGATIVE
Urine Glucose: NEGATIVE
Urobilinogen, UA: 0.2 (ref 0.0–1.0)
pH: 6 (ref 5.0–8.0)

## 2020-11-10 LAB — HCG, QUANTITATIVE, PREGNANCY: Quantitative HCG: <0.6 m[IU]/mL

## 2020-11-10 MED FILL — KETOCONAZOLE 2% SHAMPOO: 2 | 30 days supply | Qty: 120 | Fill #0

## 2020-11-11 ENCOUNTER — Other Ambulatory Visit: Payer: Self-pay | Admitting: Internal Medicine

## 2020-11-11 DIAGNOSIS — E876 Hypokalemia: Secondary | ICD-10-CM | POA: Insufficient documentation

## 2020-11-11 DIAGNOSIS — E559 Vitamin D deficiency, unspecified: Secondary | ICD-10-CM | POA: Insufficient documentation

## 2020-11-11 MED ORDER — TRIAMTERENE-HCTZ 37.5-25 MG PO CAPS: 1.0000 | ORAL_CAPSULE | Freq: Every day | ORAL | 0 refills | Status: DC

## 2020-11-11 MED ORDER — CHOLECALCIFEROL 1.25 MG (50000 UT) PO CAPS: 50000.0000 [IU] | ORAL_CAPSULE | ORAL | 1 refills | Status: DC

## 2020-11-12 ENCOUNTER — Encounter: Payer: Self-pay | Admitting: Internal Medicine

## 2020-11-12 MED FILL — VIT D2 1.25 MG (50,000 UNIT: 1.25 MG | 84 days supply | Qty: 12 | Fill #0

## 2020-11-12 MED FILL — TRIAMTERENE/HCTZ 37.5/25 CP: 37.5-25 | 90 days supply | Qty: 90 | Fill #0

## 2020-11-15 LAB — ALDOSTERONE + RENIN ACTIVITY W/ RATIO
ALDO / PRA Ratio: 7.8 Ratio (ref 0.9–28.9)
Aldosterone: 6 ng/dL
Renin Activity: 0.77 ng/mL/h (ref 0.25–5.82)

## 2020-11-15 LAB — D-DIMER, QUANTITATIVE: D-Dimer, Quant: 0.38 ug{FEU}/mL (ref <0.50)

## 2020-11-16 ENCOUNTER — Ambulatory Visit: Payer: 59 | Admitting: Internal Medicine

## 2020-11-19 DIAGNOSIS — E785 Hyperlipidemia, unspecified: Secondary | ICD-10-CM | POA: Insufficient documentation

## 2020-11-19 NOTE — Assessment & Plan Note (Signed)
She has a mildly elevated ASCVD risk score but is not on any form of contraception.  I will therefore defer on prescribing a statin at this time.

## 2020-12-06 LAB — HYPERCOAGULABLE PANEL, COMPREHENSIVE
APTT: 27.7 s
AT III Act/Nor PPP Chro: 102 %
Act. Prt C Resist w/FV Defic.: 2.6 ratio
Anticardiolipin Ab, IgG: <10 [GPL'U]
Anticardiolipin Ab, IgM: <10 [MPL'U]
Beta-2 Glycoprotein I, IgA: <10 SAU
Beta-2 Glycoprotein I, IgG: <10 SGU
Beta-2 Glycoprotein I, IgM: <10 SMU
DRVVT Screen Seconds: 40.5 s
Factor VII Antigen**: 66 %
Factor VIII Activity: 99 %
Hexagonal Phospholipid Neutral: 0 s
Homocysteine: 11.2 umol/L
Prot C Ag Act/Nor PPP Imm: 61 %
Prot S Ag Act/Nor PPP Imm: 94 %
Protein C Ag/FVII Ag Ratio**: 0.9 ratio
Protein S Ag/FVII Ag Ratio**: 1.4 ratio

## 2020-12-07 MED FILL — NEBIVOLOL HCL 5 MG TABS: 5 | 90 days supply | Qty: 90 | Fill #0

## 2020-12-14 MED FILL — IBUPROFEN 800 MG TAB: 800 | 7 days supply | Qty: 30 | Fill #0

## 2021-01-26 ENCOUNTER — Other Ambulatory Visit: Payer: Self-pay

## 2021-01-27 ENCOUNTER — Ambulatory Visit: Payer: 59 | Admitting: Internal Medicine

## 2021-01-28 ENCOUNTER — Ambulatory Visit (INDEPENDENT_AMBULATORY_CARE_PROVIDER_SITE_OTHER): Payer: 59

## 2021-01-28 ENCOUNTER — Encounter: Payer: Self-pay | Admitting: Internal Medicine

## 2021-01-28 ENCOUNTER — Ambulatory Visit: Payer: 59 | Admitting: Internal Medicine

## 2021-01-28 ENCOUNTER — Other Ambulatory Visit: Payer: Self-pay

## 2021-01-28 VITALS — BP 122/80 | HR 73 | Temp 97.5°F | Resp 18 | Ht 68.0 in | Wt 191.0 lb

## 2021-01-28 DIAGNOSIS — M79671 Pain in right foot: Secondary | ICD-10-CM | POA: Diagnosis not present

## 2021-01-28 DIAGNOSIS — M545 Low back pain, unspecified: Secondary | ICD-10-CM

## 2021-01-28 DIAGNOSIS — I10 Essential (primary) hypertension: Secondary | ICD-10-CM

## 2021-01-28 NOTE — Assessment & Plan Note (Signed)
Referral to podiatry for assessment.

## 2021-01-28 NOTE — Assessment & Plan Note (Signed)
Checking x-ray lumbar as present for 1 month or more. She does not know if she has been told she has fibroids. Does not sound typical for gerd could be muscle spasm. Not typical for diverticulitis as only lasts 2-3 minutes.

## 2021-01-28 NOTE — Patient Instructions (Signed)
We will check the x-ray of the back.  We will get you in with the foot specialist.

## 2021-01-28 NOTE — Progress Notes (Signed)
   Subjective:   Patient ID: Rita Harrison, female    DOB: 28-Sep-1981, 40 y.o.   MRN: [id number]  HPI The patient is a 40 YO female coming in for pain in the left lower back (pain lasts 2-3 minutes, very severe, 2-3 times per week, going on 1 month, not getting more often, does not hurt in between, in perimenopause and irregular cycles not concordant with these, denies injury or overuse at onset) and right foot pain (has seen podiatry in the past, would like second opinion as the prior one said she needed surgery and her insurance would not cover) and blood pressure (had a burst blood vessel in the eye back in December and another last week, was associated with several sneezing episodes, denies headaches or chest pains, denies vision blurring).   Review of Systems  Constitutional: Positive for activity change. Negative for appetite change, chills, fatigue, fever and unexpected weight change.  HENT: Negative.   Eyes: Positive for redness.  Respiratory: Negative.  Negative for cough, chest tightness and shortness of breath.   Cardiovascular: Negative.  Negative for chest pain, palpitations and leg swelling.  Gastrointestinal: Negative.  Negative for abdominal distention, abdominal pain, constipation, diarrhea, nausea and vomiting.  Musculoskeletal: Positive for arthralgias, back pain and myalgias. Negative for gait problem and joint swelling.  Skin: Negative.   Neurological: Negative.   Psychiatric/Behavioral: Negative.     Objective:  Physical Exam Constitutional:      Appearance: She is well-developed and well-nourished.  HENT:     Head: Normocephalic and atraumatic.  Eyes:     Extraocular Movements: EOM normal.     Comments: Redness left eye inner half consistent with blood vessel leaking  Cardiovascular:     Rate and Rhythm: Normal rate and regular rhythm.  Pulmonary:     Effort: Pulmonary effort is normal. No respiratory distress.     Breath sounds: Normal breath sounds. No  wheezing or rales.  Abdominal:     General: Bowel sounds are normal. There is no distension.     Palpations: Abdomen is soft.     Tenderness: There is no abdominal tenderness. There is no rebound.  Musculoskeletal:        General: Tenderness present. No edema.     Cervical back: Normal range of motion.     Comments: Pain in the right foot and callusing midfoot and 5th lateral digits. Back non-specific but overall not very tender to palpation  Skin:    General: Skin is warm and dry.  Neurological:     Mental Status: She is alert and oriented to person, place, and time.     Coordination: Coordination normal.  Psychiatric:        Mood and Affect: Mood and affect normal.     Vitals:   01/28/21 1550  BP: 122/80  Pulse: 73  Resp: 18  Temp: (!) 97.5 F (36.4 C)  TempSrc: Oral  SpO2: 94%  Weight: 191 lb (86.6 kg)  Height: 5\' 8"  (1.727 m)    This visit occurred during the SARS-CoV-2 public health emergency.  Safety protocols were in place, including screening questions prior to the visit, additional usage of staff PPE, and extensive cleaning of exam room while observing appropriate contact time as indicated for disinfecting solutions.   Assessment & Plan:

## 2021-01-28 NOTE — Assessment & Plan Note (Signed)
BP at goal on nebivolol and hctz. Advised that likely blood vessel in eye related to pressure from sneezing. No change to regimen today and labs recent in November.

## 2021-01-31 ENCOUNTER — Telehealth: Payer: Self-pay | Admitting: Internal Medicine

## 2021-01-31 NOTE — Telephone Encounter (Signed)
I received a fitness for duty/return to work release form. Patient saw Dr.Crawford on 2/11 but did not speak to her about work.   LVM for patient to call back. I have no information on patient being told to go out of work.

## 2021-02-02 NOTE — Telephone Encounter (Signed)
Spoke with patient, She is requesting intermitting leave. Has an appointment on 3/15 to discuss with Dr.Jones.

## 2021-02-08 ENCOUNTER — Ambulatory Visit (INDEPENDENT_AMBULATORY_CARE_PROVIDER_SITE_OTHER): Payer: 59

## 2021-02-08 ENCOUNTER — Ambulatory Visit: Payer: BLUE CROSS/BLUE SHIELD | Admitting: Podiatry

## 2021-02-08 ENCOUNTER — Ambulatory Visit: Payer: 59 | Admitting: Internal Medicine

## 2021-02-08 ENCOUNTER — Other Ambulatory Visit: Payer: Self-pay

## 2021-02-08 DIAGNOSIS — M21621 Bunionette of right foot: Secondary | ICD-10-CM | POA: Diagnosis not present

## 2021-02-08 DIAGNOSIS — M205X1 Other deformities of toe(s) (acquired), right foot: Secondary | ICD-10-CM

## 2021-02-08 DIAGNOSIS — L600 Ingrowing nail: Secondary | ICD-10-CM

## 2021-02-08 DIAGNOSIS — M79674 Pain in right toe(s): Secondary | ICD-10-CM

## 2021-02-08 DIAGNOSIS — M79675 Pain in left toe(s): Secondary | ICD-10-CM | POA: Diagnosis not present

## 2021-02-08 NOTE — Patient Instructions (Signed)

## 2021-02-09 MED FILL — TRIAMTERENE/HCTZ 37.5/25 CP: 37.5-25 | 90 days supply | Qty: 90 | Fill #1

## 2021-02-09 MED FILL — IBUPROFEN 800 MG TAB: 800 | 7 days supply | Qty: 30 | Fill #0

## 2021-02-09 MED FILL — VIT D2 1.25 MG (50,000 UNIT: 1.25 MG | 84 days supply | Qty: 12 | Fill #1

## 2021-02-13 NOTE — Progress Notes (Signed)
Subjective:   Patient ID: Rita Harrison, female   DOB: 40 y.o.   MRN: [id number]   HPI 40 year old female presents to the office with concerns of a painful corn around the right 5th toe which is been ongoing for many years.  She states that she was previously scheduled for surgery for this but had to cancel it.  There is no ongoing issue and she is attempted conservative treatments including padding, offloading with any resolution.  She was discussed other treatment options.  Also she has ingrown toenails of both of her big toes which cause discomfort.  Currently denies any redness or drainage or any swelling.  She has no other concerns today.   Review of Systems  All other systems reviewed and are negative.  Past Medical History:  Diagnosis Date  . Anxiety   . Arm DVT (deep venous thromboembolism), chronic (HCC)    RUE  . Chicken pox   . Hypertension   . Sickle cell anemia (HCC)    sickle cell trait    No past surgical history on file.   Current Outpatient Medications:  .  Cholecalciferol 1.25 MG (50000 UT) capsule, Take 1 capsule (50,000 Units total) by mouth once a week., Disp: 12 capsule, Rfl: 1 .  nebivolol (BYSTOLIC) 5 MG tablet, Take 1 tablet (5 mg total) by mouth daily., Disp: 90 tablet, Rfl: 0 .  triamterene-hydrochlorothiazide (DYAZIDE) 37.5-25 MG capsule, Take 1 each (1 capsule total) by mouth daily., Disp: 90 capsule, Rfl: 0  Allergies  Allergen Reactions  . Lisinopril Shortness Of Breath and Itching          Objective:  Physical Exam  General: AAO x3, NAD  Dermatological: Hyperkeratotic lesion of the dorsal lateral aspect of the right 5th toe along the PIPJ.  There is no ulceration identified there is no drainage or pus.  Incurvation present on both the medial lateral aspects of bilateral hallux nails with tenderness palpation localized edema but there is no erythema, drainage or pus or ascending cellulitis.  Vascular: Dorsalis Pedis artery and Posterior  Tibial artery pedal pulses are 2/4 bilateral with immedate capillary fill time. There is no pain with calf compression, swelling, warmth, erythema.   Neruologic: Grossly intact via light touch bilateral.   Musculoskeletal: Adductovarus right 5th toe.  Tenderness of the right 5th toe as well as the ingrown toenails but no other areas of discomfort identified.  Muscular strength 5/5 in all groups tested bilateral.  Gait: Unassisted, Nonantalgic.       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  Adductovarus present 5th toe but no evidence of acute fracture. -We discussed with conservative as well as surgical treatment options.  She is attempted numerous conservative treatments and resolution of the time she was proceed with surgical intervention peer discussed with the PIPJ arthroplasty of the right 5th toe as well as removal of ingrown toenails bilateral hallux nails with chemical matricectomy.  She wants to proceed with surgery. -The incision placement as well as the postoperative course was discussed with the patient. I discussed risks of the surgery which include, but not limited to, infection, bleeding, pain, swelling, need for further surgery, delayed or nonhealing, painful or ugly scar, numbness or sensation changes, over/under correction, recurrence, transfer lesions, further deformity, hardware failure, DVT/PE, loss  of toe/foot. Patient understands these risks and wishes to proceed with surgery. The surgical consent was reviewed with the patient all 3 pages were signed. No promises or guarantees were given to the outcome of the procedure. All questions were answered to the best of my ability. Before the surgery the patient was encouraged to  call the office if there is any further questions. The surgery will be performed at the [facility] on an outpatient basis.  Vivi Barrack DPM

## 2021-02-16 ENCOUNTER — Telehealth: Payer: Self-pay | Admitting: Internal Medicine

## 2021-02-16 NOTE — Telephone Encounter (Signed)
° °  Patient calling to discuss xray results from 2/11 xray ordered by Dr Okey Dupre She is reporting pain, seeking advice

## 2021-02-17 NOTE — Telephone Encounter (Signed)
Unable to get in contact with the patient. LVM asking the patient to return my call here at the office. Office number was provided.  

## 2021-03-01 ENCOUNTER — Ambulatory Visit: Payer: 59 | Admitting: Internal Medicine

## 2021-03-01 ENCOUNTER — Other Ambulatory Visit: Payer: Self-pay

## 2021-03-01 ENCOUNTER — Encounter: Payer: Self-pay | Admitting: Internal Medicine

## 2021-03-01 VITALS — BP 136/86 | HR 72 | Temp 98.2°F | Ht 68.0 in | Wt 194.0 lb

## 2021-03-01 DIAGNOSIS — I10 Essential (primary) hypertension: Secondary | ICD-10-CM

## 2021-03-01 DIAGNOSIS — E876 Hypokalemia: Secondary | ICD-10-CM

## 2021-03-01 DIAGNOSIS — G8929 Other chronic pain: Secondary | ICD-10-CM | POA: Diagnosis not present

## 2021-03-01 DIAGNOSIS — R109 Unspecified abdominal pain: Secondary | ICD-10-CM | POA: Diagnosis not present

## 2021-03-01 LAB — CBC WITH DIFFERENTIAL/PLATELET
Basophils Absolute: 0 10*3/uL (ref 0.0–0.1)
Basophils Relative: 0.4 % (ref 0.0–3.0)
Eosinophils Absolute: 0.1 10*3/uL (ref 0.0–0.7)
Eosinophils Relative: 0.8 % (ref 0.0–5.0)
HCT: 38.8 % (ref 36.0–46.0)
Hemoglobin: 13.6 g/dL (ref 12.0–15.0)
Lymphocytes Relative: 31.2 % (ref 12.0–46.0)
Lymphs Abs: 2.6 10*3/uL (ref 0.7–4.0)
MCHC: 35 g/dL (ref 30.0–36.0)
MCV: 81.2 fl (ref 78.0–100.0)
Monocytes Absolute: 0.5 10*3/uL (ref 0.1–1.0)
Monocytes Relative: 6.1 % (ref 3.0–12.0)
Neutro Abs: 5 10*3/uL (ref 1.4–7.7)
Neutrophils Relative %: 61.5 % (ref 43.0–77.0)
Platelets: 254 10*3/uL (ref 150.0–400.0)
RBC: 4.78 Mil/uL (ref 3.87–5.11)
RDW: 14.2 % (ref 11.5–15.5)
WBC: 8.2 10*3/uL (ref 4.0–10.5)

## 2021-03-01 LAB — BASIC METABOLIC PANEL
BUN: 31 mg/dL — ABNORMAL HIGH (ref 6–23)
CO2: 29 mEq/L (ref 19–32)
Calcium: 9.6 mg/dL (ref 8.4–10.5)
Chloride: 104 mEq/L (ref 96–112)
Creatinine, Ser: 1.27 mg/dL — ABNORMAL HIGH (ref 0.40–1.20)
GFR: 52.35 mL/min — ABNORMAL LOW (ref >60.00)
Glucose, Bld: 75 mg/dL (ref 70–99)
Potassium: 3.1 mEq/L — ABNORMAL LOW (ref 3.5–5.1)
Sodium: 142 mEq/L (ref 135–145)

## 2021-03-01 LAB — MAGNESIUM: Magnesium: 2.3 mg/dL (ref 1.5–2.5)

## 2021-03-01 NOTE — Patient Instructions (Signed)
Flank Pain, Adult Flank pain is pain that is located on the side of the body between the upper abdomen and the back. This area is called the flank. The pain may occur over a short period of time (acute), or it may be long-term or recurring (chronic). It may be mild or severe. Flank pain can be caused by many things, including:  Muscle soreness or injury.  Kidney stones or kidney disease.  Stress.  A disease of the spine (vertebral disk disease).  A lung infection (pneumonia).  Fluid around the lungs (pulmonary edema).  A skin rash caused by the chickenpox virus (shingles).  Tumors that affect the back of the abdomen.  Gallbladder disease. Follow these instructions at home:  Drink enough fluid to keep your urine clear or pale yellow.  Rest as told by your health care provider.  Take over-the-counter and prescription medicines only as told by your health care provider.  Keep a journal to track what has caused your flank pain and what has made it feel better.  Keep all follow-up visits as told by your health care provider. This is important.   Contact a health care provider if:  Your pain is not controlled with medicine.  You have new symptoms.  Your pain gets worse.  You have a fever.  Your symptoms last longer than 2-3 days.  You have trouble urinating or you are urinating very frequently. Get help right away if:  You have trouble breathing or you are short of breath.  Your abdomen hurts or it is swollen or red.  You have nausea or vomiting.  You feel faint or you pass out.  You have blood in your urine. Summary  Flank pain is pain that is located on the side of the body between the upper abdomen and the back.  The pain may occur over a short period of time (acute), or it may be long-term or recurring (chronic). It may be mild or severe.  Flank pain can be caused by many things.  Contact your health care provider if your symptoms get worse or they last  longer than 2-3 days. This information is not intended to replace advice given to you by your health care provider. Make sure you discuss any questions you have with your health care provider. Document Revised: 08/27/2020 Document Reviewed: 08/27/2020 Elsevier Patient Education  2021 [employer]  

## 2021-03-01 NOTE — Progress Notes (Signed)
Subjective:  Patient ID: Rita Harrison, female    DOB: 04-18-1981  Age: 40 y.o. MRN: [id number]  CC: Hypertension  This visit occurred during the SARS-CoV-2 public health emergency.  Safety protocols were in place, including screening questions prior to the visit, additional usage of staff PPE, and extensive cleaning of exam room while observing appropriate contact time as indicated for disinfecting solutions.    HPI Rita Harrison presents for f/up -  She complains of a 48-month history of intermittent flank pain and low back pain.  She claims at this is triggered by taking her blood pressure medicines.  She does not know if it is the beta-blocker or the diuretic.  She controls the pain with Motrin and hot packs.  The pain does not radiate towards her extremities and she denies paresthesias.  Outpatient Medications Prior to Visit  Medication Sig Dispense Refill   Cholecalciferol 1.25 MG (50000 UT) capsule Take 1 capsule (50,000 Units total) by mouth once a week. 12 capsule 1   nebivolol (BYSTOLIC) 5 MG tablet Take 1 tablet (5 mg total) by mouth daily. 90 tablet 0   triamterene-hydrochlorothiazide (DYAZIDE) 37.5-25 MG capsule Take 1 each (1 capsule total) by mouth daily. 90 capsule 0   No facility-administered medications prior to visit.    ROS Review of Systems  Constitutional: Negative for diaphoresis, fatigue and unexpected weight change.  HENT: Negative.   Eyes: Negative.   Respiratory: Negative for cough, chest tightness, shortness of breath and wheezing.   Cardiovascular: Negative for chest pain, palpitations and leg swelling.  Gastrointestinal: Negative for abdominal pain, diarrhea, nausea and vomiting.  Endocrine: Negative.   Genitourinary: Positive for flank pain.  Musculoskeletal: Positive for back pain. Negative for arthralgias and joint swelling.  Skin: Negative for color change and pallor.  Allergic/Immunologic: Negative.   Neurological: Negative.  Negative  for dizziness, weakness and light-headedness.  Hematological: Negative for adenopathy. Does not bruise/bleed easily.  Psychiatric/Behavioral: Negative.     Objective:  BP 136/86    Pulse 72    Temp 98.2 F (36.8 C) (Oral)    Ht 5\' 8"  (1.727 m)    Wt 194 lb (88 kg)    SpO2 96%    BMI 29.50 kg/m   BP Readings from Last 3 Encounters:  03/01/21 136/86  01/28/21 122/80  11/09/20 (!) 160/100    Wt Readings from Last 3 Encounters:  03/01/21 194 lb (88 kg)  01/28/21 191 lb (86.6 kg)  11/09/20 183 lb (83 kg)    Physical Exam Vitals reviewed.  Constitutional:      Appearance: Normal appearance.  HENT:     Mouth/Throat:     Mouth: Mucous membranes are moist.  Eyes:     General: No scleral icterus.    Conjunctiva/sclera: Conjunctivae normal.  Cardiovascular:     Rate and Rhythm: Normal rate and regular rhythm.     Heart sounds: No murmur heard.   Pulmonary:     Effort: Pulmonary effort is normal.     Breath sounds: No stridor. No wheezing, rhonchi or rales.  Abdominal:     General: Abdomen is flat. Bowel sounds are normal. There is no distension.     Palpations: Abdomen is soft. There is no hepatomegaly, splenomegaly or mass.     Tenderness: There is no abdominal tenderness. There is no right CVA tenderness or left CVA tenderness.  Musculoskeletal:        General: Normal range of motion.     Cervical back:  Neck supple.     Thoracic back: Normal.     Right lower leg: No edema.     Left lower leg: No edema.  Lymphadenopathy:     Cervical: No cervical adenopathy.  Skin:    General: Skin is warm and dry.     Coloration: Skin is not pale.  Neurological:     General: No focal deficit present.     Mental Status: She is alert.     Lab Results  Component Value Date   WBC 8.2 03/01/2021   HGB 13.6 03/01/2021   HCT 38.8 03/01/2021   PLT 254.0 03/01/2021   GLUCOSE 75 03/01/2021   CHOL 180 11/09/2020   TRIG 82.0 11/09/2020   HDL 41.30 11/09/2020   LDLCALC 122 (H)  11/09/2020   ALT 20 09/01/2018   AST 18 09/01/2018   NA 142 03/01/2021   K 3.1 (L) 03/01/2021   CL 104 03/01/2021   CREATININE 1.27 (H) 03/01/2021   BUN 31 (H) 03/01/2021   CO2 29 03/01/2021   TSH 1.38 11/09/2020   INR 1.08 04/23/2013   HGBA1C 5.6 02/21/2018    MM 3D SCREEN BREAST BILATERAL  Result Date: 06/24/2020 CLINICAL DATA:  Screening. EXAM: DIGITAL SCREENING BILATERAL MAMMOGRAM WITH TOMO AND CAD COMPARISON:  Previous exam(s). ACR Breast Density Category c: The breast tissue is heterogeneously dense, which may obscure small masses. FINDINGS: There are no findings suspicious for malignancy. Images were processed with CAD. IMPRESSION: No mammographic evidence of malignancy. A result letter of this screening mammogram will be mailed directly to the patient. RECOMMENDATION: Screening mammogram in one year. (Code:SM-B-01Y) BI-RADS CATEGORY  1: Negative. Electronically Signed   By: Hulan Saas M.D.   On: 06/24/2020 14:16    Assessment & Plan:   Kailani was seen today for hypertension.  Diagnoses and all orders for this visit:  Chronic hypokalemia -     Basic metabolic panel; Future -     Magnesium; Future -     Magnesium -     Basic metabolic panel -     potassium chloride (KLOR-CON) 8 MEQ tablet; Take 1 tablet (8 mEq total) by mouth with breakfast, with lunch, and with evening meal.  Essential hypertension -     Basic metabolic panel; Future -     Magnesium; Future -     CBC with Differential/Platelet; Future -     Urinalysis, Routine w reflex microscopic; Future -     Urinalysis, Routine w reflex microscopic -     CBC with Differential/Platelet -     Magnesium -     Basic metabolic panel -     potassium chloride (KLOR-CON) 8 MEQ tablet; Take 1 tablet (8 mEq total) by mouth with breakfast, with lunch, and with evening meal.  Chronic left flank pain- I think her pain is musculoskeletal in nature and not related to her antihypertensives.  I have asked her to start one of  the antihypertensives and if it causes pain to discontinue it and then try the other 1.  I reassured her that ultimately I do not think her antihypertensives are causing the discomfort and that she will eventually need to take both of them.  She is getting symptom relief with Motrin. -     Urinalysis, Routine w reflex microscopic; Future -     Urinalysis, Routine w reflex microscopic   I have discontinued Charly L. Stenglein's triamterene-hydrochlorothiazide. I am also having her start on potassium chloride. Additionally, I am having her  maintain her nebivolol and Cholecalciferol.  Meds ordered this encounter  Medications   potassium chloride (KLOR-CON) 8 MEQ tablet    Sig: Take 1 tablet (8 mEq total) by mouth with breakfast, with lunch, and with evening meal.    Dispense:  270 tablet    Refill:  0     Follow-up: Return in about 4 weeks (around 03/29/2021).  Sanda Linger, MD

## 2021-03-02 ENCOUNTER — Other Ambulatory Visit: Payer: Self-pay | Admitting: Internal Medicine

## 2021-03-02 LAB — URINALYSIS, ROUTINE W REFLEX MICROSCOPIC
Bilirubin Urine: NEGATIVE
Ketones, ur: NEGATIVE
Leukocytes,Ua: NEGATIVE
Nitrite: NEGATIVE
Specific Gravity, Urine: 1.025 (ref 1.000–1.030)
Total Protein, Urine: NEGATIVE
Urine Glucose: NEGATIVE
Urobilinogen, UA: 0.2 (ref 0.0–1.0)
pH: 6 (ref 5.0–8.0)

## 2021-03-02 MED ORDER — POTASSIUM CHLORIDE ER 8 MEQ PO TBCR: 8.0000 meq | EXTENDED_RELEASE_TABLET | Freq: Three times a day (TID) | ORAL | 0 refills | Status: DC

## 2021-03-03 ENCOUNTER — Other Ambulatory Visit (HOSPITAL_COMMUNITY): Payer: Self-pay | Admitting: Physician Assistant

## 2021-03-08 NOTE — Telephone Encounter (Signed)
Forms have been completed & Placed in providers box to review and sign.  

## 2021-03-10 DIAGNOSIS — Z0279 Encounter for issue of other medical certificate: Secondary | ICD-10-CM

## 2021-03-10 NOTE — Telephone Encounter (Signed)
Forms have been signed, Faxed to [employer], Copy sent to scan &Charged for.   Patient informed and original  mailed to patient for her records.

## 2021-03-17 ENCOUNTER — Telehealth: Payer: Self-pay | Admitting: Podiatry

## 2021-03-17 ENCOUNTER — Telehealth: Payer: Self-pay | Admitting: Urology

## 2021-03-17 MED FILL — IBUPROFEN 800 MG TAB: 800 | 7 days supply | Qty: 30 | Fill #0

## 2021-03-17 MED FILL — POTASSIUM CHLORIDE ER 8 MEQ: 8 | 90 days supply | Qty: 270 | Fill #0

## 2021-03-17 NOTE — Telephone Encounter (Signed)
DOS - 04/06/21   EXC NAIL PERM - B/L --- [address] REPAIR 5TH RIGHT --- [id number]   [facility] EFFECTIVE DATE - 12/18/20  PLAN DEDUCTIBLE - $300.00 W/ $0.00 REMAINING OUT OF POCKET - $600 W/ $300.00 REMAINING COPAY - $0.00 COINSURANCE - 40%    SPOKE WITH HANNAH WITH [employer] AND SHE STATED THAT NO PRIOR AUTH IS REQUIRED FOR CPT CODES 81840 X'S 2 AND [id number].  REF #[id number]

## 2021-03-17 NOTE — Telephone Encounter (Signed)
Patient called in requesting office information to have FMLA paperwork sent over Via Fax from [employer], will have it faxed immediately for completion

## 2021-03-17 NOTE — Telephone Encounter (Signed)
I just received paperwork from [employer], will speak with Patient and take care of FMLA forms.

## 2021-04-05 ENCOUNTER — Other Ambulatory Visit: Payer: Self-pay | Admitting: Internal Medicine

## 2021-04-05 ENCOUNTER — Other Ambulatory Visit (HOSPITAL_COMMUNITY): Payer: Self-pay

## 2021-04-05 DIAGNOSIS — I10 Essential (primary) hypertension: Secondary | ICD-10-CM

## 2021-04-05 MED ORDER — NEBIVOLOL HCL 5 MG PO TABS
ORAL_TABLET | Freq: Every day | ORAL | 0 refills | Status: DC
Start: 2021-04-05 — End: 2021-05-31
  Filled 2021-04-05 – 2021-04-27 (×2): qty 90, 90d supply, fill #0

## 2021-04-11 ENCOUNTER — Encounter: Payer: 59 | Admitting: Podiatry

## 2021-04-13 ENCOUNTER — Other Ambulatory Visit (HOSPITAL_COMMUNITY): Payer: Self-pay

## 2021-04-20 ENCOUNTER — Other Ambulatory Visit: Payer: Self-pay | Admitting: Podiatry

## 2021-04-20 ENCOUNTER — Encounter: Payer: Self-pay | Admitting: Podiatry

## 2021-04-20 ENCOUNTER — Other Ambulatory Visit (HOSPITAL_COMMUNITY): Payer: Self-pay

## 2021-04-20 DIAGNOSIS — M2041 Other hammer toe(s) (acquired), right foot: Secondary | ICD-10-CM | POA: Diagnosis not present

## 2021-04-20 DIAGNOSIS — L600 Ingrowing nail: Secondary | ICD-10-CM | POA: Diagnosis not present

## 2021-04-20 MED ORDER — HYDROCODONE-ACETAMINOPHEN 5-325 MG PO TABS
1.0000 | ORAL_TABLET | Freq: Four times a day (QID) | ORAL | 0 refills | Status: DC | PRN
  Filled 2021-04-20: qty 20, 3d supply, fill #0

## 2021-04-20 MED ORDER — PROMETHAZINE HCL 25 MG PO TABS
25.0000 mg | ORAL_TABLET | Freq: Three times a day (TID) | ORAL | 0 refills | Status: DC | PRN
  Filled 2021-04-20: qty 20, 7d supply, fill #0

## 2021-04-20 MED ORDER — CEPHALEXIN 500 MG PO CAPS
500.0000 mg | ORAL_CAPSULE | Freq: Three times a day (TID) | ORAL | 0 refills | Status: DC
  Filled 2021-04-20: qty 21, 7d supply, fill #0

## 2021-04-20 NOTE — Progress Notes (Signed)
Postop medications sent 

## 2021-04-21 ENCOUNTER — Telehealth: Payer: Self-pay | Admitting: *Deleted

## 2021-04-21 ENCOUNTER — Encounter: Payer: 59 | Admitting: Podiatry

## 2021-04-21 NOTE — Telephone Encounter (Signed)
Called patient and stated that I was calling to see how the patient was doing after having surgery with Dr Ardelle Anton and the patient states she is doing ok and there is not any fever or chills and not any nausea and the nerve block wore off 3 hours after I got home and I can wiggle my toes and I am icing and elevating and to call the office if any concerns or questions. Rita Harrison

## 2021-04-25 ENCOUNTER — Ambulatory Visit (INDEPENDENT_AMBULATORY_CARE_PROVIDER_SITE_OTHER): Payer: 59

## 2021-04-25 ENCOUNTER — Ambulatory Visit (INDEPENDENT_AMBULATORY_CARE_PROVIDER_SITE_OTHER): Payer: 59 | Admitting: Podiatry

## 2021-04-25 ENCOUNTER — Other Ambulatory Visit: Payer: Self-pay

## 2021-04-25 ENCOUNTER — Encounter: Payer: Self-pay | Admitting: Podiatry

## 2021-04-25 DIAGNOSIS — M79674 Pain in right toe(s): Secondary | ICD-10-CM

## 2021-04-25 DIAGNOSIS — L600 Ingrowing nail: Secondary | ICD-10-CM

## 2021-04-25 DIAGNOSIS — M79675 Pain in left toe(s): Secondary | ICD-10-CM

## 2021-04-25 DIAGNOSIS — M205X1 Other deformities of toe(s) (acquired), right foot: Secondary | ICD-10-CM | POA: Diagnosis not present

## 2021-04-27 ENCOUNTER — Other Ambulatory Visit (HOSPITAL_COMMUNITY): Payer: Self-pay

## 2021-05-02 ENCOUNTER — Ambulatory Visit (INDEPENDENT_AMBULATORY_CARE_PROVIDER_SITE_OTHER): Payer: 59 | Admitting: Podiatry

## 2021-05-02 ENCOUNTER — Encounter: Payer: Self-pay | Admitting: Podiatry

## 2021-05-02 ENCOUNTER — Other Ambulatory Visit: Payer: Self-pay

## 2021-05-02 DIAGNOSIS — M205X1 Other deformities of toe(s) (acquired), right foot: Secondary | ICD-10-CM

## 2021-05-02 DIAGNOSIS — M79674 Pain in right toe(s): Secondary | ICD-10-CM

## 2021-05-02 DIAGNOSIS — L600 Ingrowing nail: Secondary | ICD-10-CM

## 2021-05-02 DIAGNOSIS — M79675 Pain in left toe(s): Secondary | ICD-10-CM

## 2021-05-02 NOTE — Progress Notes (Signed)
Subjective: Rita Harrison is a 40 y.o. is seen today in office s/p right fifth digit PIPJ arthroplasty, right hallux partial nail avulsion preformed on 04/20/2021.  Overall states that her pain is improving.  She been walking in the surgical shoe.  Denies any systemic complaints such as fevers, chills, nausea, vomiting. No calf pain, chest pain, shortness of breath.   Objective: General: No acute distress, AAOx3  DP/PT pulses palpable 2/4, CRT < 3 sec to all digits.  Protective sensation intact. Motor function intact.  Right foot: Incision is well coapted without any evidence of dehiscence with sutures intact. There is no surrounding erythema, ascending cellulitis, fluctuance, crepitus, malodor, drainage/purulence. There is mild edema around the surgical site. There is mild pain along the surgical site.  Nail procedure site is benign with a scab present and swelling granulation tissue without any drainage or pus.  No edema, erythema. No other areas of tenderness to bilateral lower extremities.  No other open lesions or pre-ulcerative lesions.  No pain with calf compression, swelling, warmth, erythema.   Assessment and Plan:  Status post right foot surgery, doing well with no complications   -Treatment options discussed including all alternatives, risks, and complications -X-rays obtained reviewed.  No evidence of acute fracture.  Status post PIPJ arthroplasty -Of the fifth toe right antibiotic ointment and dressing was applied.  Keep dressing clean, dry, intact.  For the hallux also soap and water daily and apply antibiotic ointment and a dressing. -Remain in surgical shoe for now -Ice/elevation -Pain medication as needed. -Monitor for any clinical signs or symptoms of infection and DVT/PE and directed to call the office immediately should any occur or go to the ER. -Follow-up as scheduled or sooner if any problems arise. In the meantime, encouraged to call the office with any questions,  concerns, change in symptoms.   *Possible suture removal in cam boot next appointment so she can return to work.  Ovid Curd, DPM

## 2021-05-05 ENCOUNTER — Encounter: Payer: 59 | Admitting: Podiatry

## 2021-05-05 NOTE — Progress Notes (Signed)
Subjective: Rita Harrison is a 40 y.o. is seen today in office s/p right fifth digit PIPJ arthroplasty, right hallux partial nail avulsion preformed on 04/20/2021.  Presents today for suture removal.  Otherwise states she has been doing well.  She gets some numbness mostly at the big toe but seems that this is getting better.  She has some dry skin that she notices no open sores.  Denies any fevers, chills, nausea, vomiting.    Objective: General: No acute distress, AAOx3  DP/PT pulses palpable 2/4, CRT < 3 sec to all digits.  Protective sensation intact. Motor function intact.  Right foot: Incision is well coapted without any evidence of dehiscence with sutures intact.  There is minimal edema to the fifth toe.  No erythema or warmth.  Incision is well coapted maintained pus.  Status post partial nail avulsion right hallux which has a scab and no edema, erythema or signs of infection.  No open lesions.  The fifth toe is in rectus position. No other open lesions or pre-ulcerative lesions.  No pain with calf compression, swelling, warmth, erythema.   Assessment and Plan:  Status post right foot surgery, doing well with no complications   -Treatment options discussed including all alternatives, risks, and complications -Sutures removed today without complications.  Antibiotic ointment dressing applied.  She was sent home with Cipro and water daily and dry thoroughly apply a similar bandage. -Continue with surgical shoe but she is going to go back to work next week I dispensed a cam boot for more protection. -Continue to ice elevate -Continue to wash the right big toe with soap and water daily and apply antibiotic ointment and a bandage.  Vivi Barrack DPM

## 2021-05-11 ENCOUNTER — Encounter: Payer: Self-pay | Admitting: Podiatry

## 2021-05-19 ENCOUNTER — Other Ambulatory Visit: Payer: Self-pay

## 2021-05-19 ENCOUNTER — Ambulatory Visit (INDEPENDENT_AMBULATORY_CARE_PROVIDER_SITE_OTHER): Payer: 59 | Admitting: Podiatry

## 2021-05-19 DIAGNOSIS — M79675 Pain in left toe(s): Secondary | ICD-10-CM

## 2021-05-19 DIAGNOSIS — M205X1 Other deformities of toe(s) (acquired), right foot: Secondary | ICD-10-CM

## 2021-05-19 DIAGNOSIS — M79674 Pain in right toe(s): Secondary | ICD-10-CM

## 2021-05-19 DIAGNOSIS — Z9889 Other specified postprocedural states: Secondary | ICD-10-CM

## 2021-05-25 NOTE — Progress Notes (Signed)
Subjective: Rita Harrison is a 40 y.o. is seen today in office s/p right fifth digit PIPJ arthroplasty, right hallux partial nail avulsion preformed on 04/20/2021.  States that she is doing better and is seeing much improvement.  No significant pain.  States incision is healing well and she can even tell what surgery was that she states.  Some swelling but this is improving.  Still wondering surgical boot at work. Denies any fevers, chills, nausea, vomiting.    Objective: General: No acute distress, AAOx3  DP/PT pulses palpable 2/4, CRT < 3 sec to all digits.  Protective sensation intact. Motor function intact.  Right foot: Incision is well coapted without any evidence of dehiscence and scar is formed.  There is still edema present therapy appears to be improved.  The toes in rectus position.  There is no pain in the toe no other areas of discomfort.  The procedure site for the ingrown toenail is healed. No other open lesions or pre-ulcerative lesions.  No pain with calf compression, swelling, warmth, erythema.   Assessment and Plan:  Status post right foot surgery, doing well with no complications   -Treatment options discussed including all alternatives, risks, and complications -Overall doing better.  Continue with surgical boot for now but we discussed that she can start to transition to regular shoe as tolerated.  Still wear the cam boot at work and as she improves then she can wear a regular shoe at work.  Continue to ice and elevate as well.  Vivi Barrack DPM

## 2021-05-26 ENCOUNTER — Other Ambulatory Visit: Payer: Self-pay

## 2021-05-26 ENCOUNTER — Encounter (HOSPITAL_BASED_OUTPATIENT_CLINIC_OR_DEPARTMENT_OTHER): Payer: Self-pay | Admitting: *Deleted

## 2021-05-26 ENCOUNTER — Emergency Department (HOSPITAL_BASED_OUTPATIENT_CLINIC_OR_DEPARTMENT_OTHER)
Admission: EM | Admit: 2021-05-26 | Discharge: 2021-05-27 | Disposition: A | Discharge status: Home / Self Care | Payer: 59 | Attending: Emergency Medicine | Admitting: Emergency Medicine

## 2021-05-26 DIAGNOSIS — M7989 Other specified soft tissue disorders: Secondary | ICD-10-CM | POA: Diagnosis not present

## 2021-05-26 DIAGNOSIS — Z79899 Other long term (current) drug therapy: Secondary | ICD-10-CM | POA: Insufficient documentation

## 2021-05-26 DIAGNOSIS — I10 Essential (primary) hypertension: Secondary | ICD-10-CM | POA: Insufficient documentation

## 2021-05-26 DIAGNOSIS — Z87891 Personal history of nicotine dependence: Secondary | ICD-10-CM | POA: Diagnosis not present

## 2021-05-26 MED ORDER — MELOXICAM 7.5 MG PO TABS: 7.5000 mg | ORAL_TABLET | Freq: Every day | ORAL | 0 refills | Status: DC

## 2021-05-26 NOTE — Discharge Instructions (Signed)
Please read and follow all provided instructions.  Your diagnoses today include:  1. Foot swelling     Tests performed today include: Vital signs. See below for your results today.   Medications prescribed:  Meloxicam - anti-inflammatory pain medication  You have been prescribed an anti-inflammatory medication or NSAID. Take with food. Do not take aspirin, ibuprofen, or naproxen if taking this medication. Take smallest effective dose for the shortest duration needed for your pain. Stop taking if you experience stomach pain or vomiting.   Take any prescribed medications only as directed.  Home care instructions:  Follow any educational materials contained in this packet Follow R.I.C.E. Protocol: R - rest your injury  I  - use ice on injury without applying directly to skin C - compress injury with bandage or splint E - elevate the injury as much as possible  Follow-up instructions: Please follow-up with your podiatrist for recheck in 1 week.    Return instructions:  Please return if your toes or feet are numb or tingling, appear gray or blue, or you have severe pain (also elevate the leg and loosen splint or wrap if you were given one) Please return to the Emergency Department if you experience worsening symptoms.  Please return if you have any other emergent concerns.  Additional Information:  Your vital signs today were: BP (!) 147/96 (BP Location: Right Arm)   Pulse 90   Temp 98.9 F (37.2 C) (Oral)   Resp 16   Ht 5\' 8"  (1.727 m)   Wt 89.6 kg   LMP 05/06/2021   SpO2 99%   BMI 30.03 kg/m  If your blood pressure (BP) was elevated above 135/85 this visit, please have this repeated by your doctor within one month. --------------

## 2021-05-26 NOTE — ED Provider Notes (Signed)
[facility] EMERGENCY DEPARTMENT Provider Note   CSN: [id number] Arrival date & time: 05/26/21  2214     History Chief Complaint  Patient presents with   Foot Swelling    Rita Harrison is a 40 y.o. female.  Patient with history of right upper extremity DVT presents the emergency department for evaluation of bilateral foot swelling.  Patient had a procedure done on her right fifth toe about a month ago and she continues to wear surgical boot while at work.  She reports recently returning to work.  She has noted increasing swelling in both feet, including her left foot.  No redness.  No calf pain or swelling.  No fevers or other symptoms of infection.  She notices that the swelling is worse at the end of the day after standing for her job.  Swelling is improved in the mornings after waking.  She has tried ibuprofen without improvement.  Onset of symptoms gradual.  Course is constant.  She does not take any calcium channel blockers and is only on a beta-blocker for blood pressure.      Past Medical History:  Diagnosis Date   Anxiety    Arm DVT (deep venous thromboembolism), chronic (HCC)    RUE   Chicken pox    Hypertension    Sickle cell anemia (HCC)    sickle cell trait    Patient Active Problem List   Diagnosis Date Noted   Chronic left flank pain 03/01/2021   Hyperlipidemia with target LDL less than 130 11/19/2020   Vitamin D deficiency disease 11/11/2020   Chronic hypokalemia 11/11/2020   Encounter for general adult medical examination with abnormal findings 11/09/2020   Arm DVT (deep venous thromboembolism), chronic (HCC)    Hammer toes, bilateral 03/14/2019   Essential hypertension 07/29/2015    History reviewed. No pertinent surgical history.   OB History   No obstetric history on file.     Family History  Problem Relation Age of Onset   Sickle cell anemia Father    Kidney cancer Father    Breast cancer Maternal Grandmother     Social  History   Tobacco Use   Smoking status: Former    Packs/day: 0.50    Years: 10.00    Pack years: 5.00    Types: Cigarettes   Smokeless tobacco: Never  Vaping Use   Vaping Use: Former  Substance Use Topics   Alcohol use: Not Currently    Comment: occasionally   Drug use: Yes    Types: Marijuana    Comment: occasionally    Home Medications Prior to Admission medications   Medication Sig Start Date End Date Taking? Authorizing Provider  meloxicam (MOBIC) 7.5 MG tablet Take 1 tablet (7.5 mg total) by mouth daily. 05/26/21  Yes Renne Crigler, PA-C  Cholecalciferol 1.25 MG (50000 UT) capsule Take 1 capsule (50,000 Units total) by mouth once a week. 11/11/20   Etta Grandchild, MD  ketoconazole (NIZORAL) 2 % shampoo SHAMPOO SCALP, LET SIT 5 MINUTES, THEN RINSE OUT. 10/12/20 10/12/21  McPeeks, Marcelo Baldy, PA-C  nebivolol (BYSTOLIC) 5 MG tablet TAKE 1 TABLET BY MOUTH ONCE A DAY 04/05/21 04/05/22  Etta Grandchild, MD  potassium chloride (KLOR-CON) 8 MEQ tablet TAKE 1 TABLET BY MOUTH 3 TIMES DAILY WITH BREAKFAST, LUNCH AND EVENING MEAL 03/02/21 03/02/22  Etta Grandchild, MD  promethazine (PHENERGAN) 25 MG tablet Take 1 tablet (25 mg total) by mouth every 8 (eight) hours as needed for nausea  or vomiting. 04/20/21   Vivi Barrack, DPM  Vitamin D, Ergocalciferol, (DRISDOL) 1.25 MG (50000 UNIT) CAPS capsule TAKE 1 CAPSULE BY MOUTH ONCE A WEEK. 11/11/20 11/11/21  Etta Grandchild, MD  triamterene-hydrochlorothiazide (DYAZIDE) 37.5-25 MG capsule Take 1 each (1 capsule total) by mouth daily. 11/11/20 03/01/21  Etta Grandchild, MD    Allergies    Lisinopril  Review of Systems   Review of Systems  Constitutional:  Negative for activity change.  Cardiovascular:  Negative for leg swelling.  Musculoskeletal:  Positive for joint swelling. Negative for arthralgias, back pain, gait problem and neck pain.  Skin:  Negative for wound.  Neurological:  Negative for weakness and numbness.   Physical  Exam Updated Vital Signs BP (!) 147/96 (BP Location: Right Arm)   Pulse 90   Temp 98.9 F (37.2 C) (Oral)   Resp 16   Ht 5\' 8"  (1.727 m)   Wt 89.6 kg   LMP 05/06/2021   SpO2 99%   BMI 30.03 kg/m   Physical Exam Vitals and nursing note reviewed.  Constitutional:      Appearance: She is well-developed.  HENT:     Head: Normocephalic and atraumatic.  Eyes:     Conjunctiva/sclera: Conjunctivae normal.     Pupils: Pupils are equal, round, and reactive to light.  Cardiovascular:     Pulses: Normal pulses. No decreased pulses.  Pulmonary:     Effort: No respiratory distress.  Musculoskeletal:        General: No tenderness.     Cervical back: Normal range of motion and neck supple.     Comments: Right foot and ankle: Trace swelling over the dorsum of the foot.  Postsurgical changes to the fifth toe.  No calf pain, tenderness or swelling.  Left foot and ankle: 1+ swelling over the dorsum of the foot without overlying erythema.  No pain with passive range of motion of the foot or ankle.  No calf pain, tenderness or swelling.  Skin:    General: Skin is warm and dry.  Neurological:     Mental Status: She is alert.     Sensory: No sensory deficit.     Comments: Motor, sensation, and vascular distal to the injury is fully intact.   Psychiatric:        Mood and Affect: Mood normal.    ED Results / Procedures / Treatments   Labs (all labs ordered are listed, but only abnormal results are displayed) Labs Reviewed - No data to display  EKG None  Radiology No results found.  Procedures Procedures   Medications Ordered in ED Medications - No data to display  ED Course  I have reviewed the triage vital signs and the nursing notes.  Pertinent labs & imaging results that were available during my care of the patient were reviewed by me and considered in my medical decision making (see chart for details).  Patient seen and examined.  Will give course of meloxicam to try.   Discussed to avoid use of ibuprofen and take this medication with food.  Encouraged continued use of compression stockings.  Encouraged follow-up with podiatrist if symptoms or not improving.  Vital signs reviewed and are as follows: BP (!) 147/96 (BP Location: Right Arm)   Pulse 90   Temp 98.9 F (37.2 C) (Oral)   Resp 16   Ht 5\' 8"  (1.727 m)   Wt 89.6 kg   LMP 05/06/2021   SpO2 99%   BMI 30.03 kg/m  Patient urged to return with worsening symptoms or other concerns. Patient verbalized understanding and agrees with plan.     MDM Rules/Calculators/A&P                          Patient with bilateral foot swelling, slightly worse on the left.  Patient has been wearing compression stockings.  Symptoms are isolated to the feet.  Does seem to have some dependency with improvement after sleeping and having her feet elevated.  Suspect element of venous insufficiency given history.  Symptoms are not consistent with DVT and she does not have any calf or thigh symptoms.  No concern for infection or cellulitis.  Symptomatic management as above.  She has a podiatrist if symptoms persist.  Final Clinical Impression(s) / ED Diagnoses Final diagnoses:  Foot swelling    Rx / DC Orders ED Discharge Orders          Ordered    meloxicam (MOBIC) 7.5 MG tablet  Daily        05/26/21 2303             Renne Crigler, PA-C 05/26/21 2311    Koleen Distance, MD 05/26/21 [phone]

## 2021-05-26 NOTE — ED Triage Notes (Signed)
Swelling to both feet x 3 days. Pain in her right foot. States she had surgery on her right little toe 5 weeks ago.

## 2021-05-28 ENCOUNTER — Other Ambulatory Visit (HOSPITAL_COMMUNITY): Payer: Self-pay

## 2021-05-28 MED ORDER — MELOXICAM 7.5 MG PO TABS
7.5000 mg | ORAL_TABLET | Freq: Every day | ORAL | 0 refills | Status: DC
  Filled 2021-05-28: qty 10, 10d supply, fill #0

## 2021-05-31 ENCOUNTER — Other Ambulatory Visit: Payer: Self-pay

## 2021-05-31 ENCOUNTER — Other Ambulatory Visit (HOSPITAL_COMMUNITY): Payer: Self-pay

## 2021-05-31 ENCOUNTER — Ambulatory Visit: Payer: 59 | Admitting: Internal Medicine

## 2021-05-31 ENCOUNTER — Encounter: Payer: Self-pay | Admitting: Internal Medicine

## 2021-05-31 VITALS — BP 158/96 | HR 74 | Temp 98.2°F | Ht 68.0 in | Wt 192.0 lb

## 2021-05-31 DIAGNOSIS — I10 Essential (primary) hypertension: Secondary | ICD-10-CM

## 2021-05-31 DIAGNOSIS — E876 Hypokalemia: Secondary | ICD-10-CM

## 2021-05-31 LAB — BASIC METABOLIC PANEL
BUN: 12 mg/dL (ref 6–23)
CO2: 24 mEq/L (ref 19–32)
Calcium: 9.2 mg/dL (ref 8.4–10.5)
Chloride: 106 mEq/L (ref 96–112)
Creatinine, Ser: 0.89 mg/dL (ref 0.40–1.20)
GFR: 80.07 mL/min (ref >60.00)
Glucose, Bld: 103 mg/dL — ABNORMAL HIGH (ref 70–99)
Potassium: 3.2 mEq/L — ABNORMAL LOW (ref 3.5–5.1)
Sodium: 140 mEq/L (ref 135–145)

## 2021-05-31 LAB — MAGNESIUM: Magnesium: 2.3 mg/dL (ref 1.5–2.5)

## 2021-05-31 MED ORDER — SPIRONOLACTONE 50 MG PO TABS
50.0000 mg | ORAL_TABLET | Freq: Every day | ORAL | 0 refills | Status: DC
  Filled 2021-05-31: qty 90, 90d supply, fill #0

## 2021-05-31 MED ORDER — NEBIVOLOL HCL 5 MG PO TABS
ORAL_TABLET | Freq: Every day | ORAL | 0 refills | Status: DC
  Filled 2021-05-31: qty 90, fill #0
  Filled 2021-08-11 – 2021-09-24 (×2): qty 90, 90d supply, fill #0

## 2021-05-31 NOTE — Progress Notes (Signed)
Subjective:  Patient ID: Rita Harrison, female    DOB: 25-Mar-1981  Age: 40 y.o. MRN: [id number]  CC: Hypertension  This visit occurred during the SARS-CoV-2 public health emergency.  Safety protocols were in place, including screening questions prior to the visit, additional usage of staff PPE, and extensive cleaning of exam room while observing appropriate contact time as indicated for disinfecting solutions.    HPI Ricketta L Delgadillo presents for f/up -   She is concerned her blood pressure is not adequately well controlled.  She does not consistently take nebivolol.  She denies headache, blurred vision, chest pain, shortness of breath, or diaphoresis.  Outpatient Medications Prior to Visit  Medication Sig Dispense Refill   ketoconazole (NIZORAL) 2 % shampoo SHAMPOO SCALP, LET SIT 5 MINUTES, THEN RINSE OUT. 120 mL 11   Vitamin D, Ergocalciferol, (DRISDOL) 1.25 MG (50000 UNIT) CAPS capsule TAKE 1 CAPSULE BY MOUTH ONCE A WEEK. 12 capsule 1   meloxicam (MOBIC) 7.5 MG tablet Take 1 tablet (7.5 mg total) by mouth daily. 10 tablet 0   meloxicam (MOBIC) 7.5 MG tablet Take 1 tablet (7.5 mg total) by mouth daily. 10 tablet 0   nebivolol (BYSTOLIC) 5 MG tablet TAKE 1 TABLET BY MOUTH ONCE A DAY 90 tablet 0   promethazine (PHENERGAN) 25 MG tablet Take 1 tablet (25 mg total) by mouth every 8 (eight) hours as needed for nausea or vomiting. 20 tablet 0   Cholecalciferol 1.25 MG (50000 UT) capsule Take 1 capsule (50,000 Units total) by mouth once a week. (Patient not taking: Reported on 05/31/2021) 12 capsule 1   potassium chloride (KLOR-CON) 8 MEQ tablet TAKE 1 TABLET BY MOUTH 3 TIMES DAILY WITH BREAKFAST, LUNCH AND EVENING MEAL (Patient not taking: Reported on 05/31/2021) 270 tablet 0   No facility-administered medications prior to visit.    ROS Review of Systems  Constitutional:  Negative for diaphoresis, fatigue and unexpected weight change.  HENT: Negative.    Eyes:  Negative for visual  disturbance.  Respiratory:  Negative for cough, chest tightness, shortness of breath and wheezing.   Cardiovascular:  Negative for chest pain, palpitations and leg swelling.  Gastrointestinal:  Negative for abdominal pain, diarrhea and nausea.  Endocrine: Negative.   Genitourinary: Negative.  Negative for difficulty urinating and hematuria.  Musculoskeletal: Negative.  Negative for arthralgias and myalgias.  Skin: Negative.   Neurological:  Negative for dizziness, weakness, light-headedness and headaches.  Hematological:  Negative for adenopathy. Does not bruise/bleed easily.  Psychiatric/Behavioral: Negative.     Objective:  BP (!) 158/96 (BP Location: Right Arm, Patient Position: Sitting, Cuff Size: Large)   Pulse 74   Temp 98.2 F (36.8 C) (Oral)   Ht 5\' 8"  (1.727 m)   Wt 192 lb (87.1 kg)   LMP 05/06/2021   SpO2 98%   BMI 29.19 kg/m   BP Readings from Last 3 Encounters:  05/31/21 (!) 158/96  05/26/21 (!) 147/96  03/01/21 136/86    Wt Readings from Last 3 Encounters:  05/31/21 192 lb (87.1 kg)  05/26/21 197 lb 8 oz (89.6 kg)  03/01/21 194 lb (88 kg)    Physical Exam Vitals reviewed.  Constitutional:      Appearance: Normal appearance.  HENT:     Nose: Nose normal.     Mouth/Throat:     Mouth: Mucous membranes are moist.  Eyes:     General: No scleral icterus.    Conjunctiva/sclera: Conjunctivae normal.  Cardiovascular:     Rate  and Rhythm: Normal rate and regular rhythm.     Heart sounds: No murmur heard. Pulmonary:     Breath sounds: No stridor. No wheezing, rhonchi or rales.  Abdominal:     General: Abdomen is flat.     Palpations: There is no mass.     Tenderness: There is no abdominal tenderness. There is no guarding.  Musculoskeletal:        General: Normal range of motion.     Cervical back: Neck supple.     Right lower leg: No edema.     Left lower leg: No edema.  Skin:    General: Skin is warm and dry.  Neurological:     General: No focal  deficit present.     Mental Status: She is alert.    Lab Results  Component Value Date   WBC 8.2 03/01/2021   HGB 13.6 03/01/2021   HCT 38.8 03/01/2021   PLT 254.0 03/01/2021   GLUCOSE 103 (H) 05/31/2021   CHOL 180 11/09/2020   TRIG 82.0 11/09/2020   HDL 41.30 11/09/2020   LDLCALC 122 (H) 11/09/2020   ALT 20 09/01/2018   AST 18 09/01/2018   NA 140 05/31/2021   K 3.2 (L) 05/31/2021   CL 106 05/31/2021   CREATININE 0.89 05/31/2021   BUN 12 05/31/2021   CO2 24 05/31/2021   TSH 1.38 11/09/2020   INR 1.08 04/23/2013   HGBA1C 5.6 02/21/2018    No results found.  Assessment & Plan:   Leilana was seen today for hypertension.  Diagnoses and all orders for this visit:  Chronic hypokalemia -     Basic metabolic panel; Future -     Magnesium; Future -     Magnesium -     Basic metabolic panel -     spironolactone (ALDACTONE) 50 MG tablet; Take 1 tablet (50 mg total) by mouth daily.  Essential hypertension- Her blood pressure is not adequately well controlled and she has a low potassium level.  I have asked her to be more compliant with nebivolol and to start taking spironolactone. -     Basic metabolic panel; Future -     Magnesium; Future -     Magnesium -     Basic metabolic panel -     nebivolol (BYSTOLIC) 5 MG tablet; TAKE 1 TABLET BY MOUTH ONCE A DAY -     spironolactone (ALDACTONE) 50 MG tablet; Take 1 tablet (50 mg total) by mouth daily.  I have discontinued Hallel L. Din's Cholecalciferol, potassium chloride, promethazine, meloxicam, and meloxicam. I am also having her start on spironolactone. Additionally, I am having her maintain her Vitamin D (Ergocalciferol), ketoconazole, and nebivolol.  Meds ordered this encounter  Medications   nebivolol (BYSTOLIC) 5 MG tablet    Sig: TAKE 1 TABLET BY MOUTH ONCE A DAY    Dispense:  90 tablet    Refill:  0   spironolactone (ALDACTONE) 50 MG tablet    Sig: Take 1 tablet (50 mg total) by mouth daily.    Dispense:  90  tablet    Refill:  0      Follow-up: Return in about 4 months (around 09/30/2021).  Sanda Linger, MD

## 2021-05-31 NOTE — Patient Instructions (Signed)

## 2021-06-13 ENCOUNTER — Other Ambulatory Visit: Payer: Self-pay | Admitting: Internal Medicine

## 2021-06-13 DIAGNOSIS — Z1231 Encounter for screening mammogram for malignant neoplasm of breast: Secondary | ICD-10-CM

## 2021-06-23 ENCOUNTER — Ambulatory Visit (INDEPENDENT_AMBULATORY_CARE_PROVIDER_SITE_OTHER): Payer: 59 | Admitting: Podiatry

## 2021-06-23 ENCOUNTER — Other Ambulatory Visit: Payer: Self-pay

## 2021-06-23 DIAGNOSIS — M205X1 Other deformities of toe(s) (acquired), right foot: Secondary | ICD-10-CM

## 2021-06-23 DIAGNOSIS — Z9889 Other specified postprocedural states: Secondary | ICD-10-CM

## 2021-06-23 DIAGNOSIS — M79675 Pain in left toe(s): Secondary | ICD-10-CM

## 2021-06-23 DIAGNOSIS — M79674 Pain in right toe(s): Secondary | ICD-10-CM

## 2021-06-28 NOTE — Progress Notes (Signed)
Subjective: Rita Harrison is a 40 y.o. is seen today in office s/p right fifth digit PIPJ arthroplasty, right hallux partial nail avulsion preformed on 04/20/2021.  States that she has been doing well.  She is back to wearing regular shoe full-time.  She gets an occasional "shock" in the fifth toe but this is intermittent.  Overall she said that she is doing well.  The toenail procedure sites healing well any signs of infection she reports.  No drainage or pus.  Currently denies any fevers, chills, nausea, vomiting.   Objective: General: No acute distress, AAOx3  DP/PT pulses palpable 2/4, CRT < 3 sec to all digits.  Protective sensation intact. Motor function intact.  Right foot: Incision is well coapted without any evidence of dehiscence and scar is formed.  There is minimal edema to the fifth toe.  There is no tenderness.  No erythema or warmth.  Status post partial nail avulsion on the right hallux without any edema, erythema, drainage or pus or signs of infection. No other open lesions or pre-ulcerative lesions.  No pain with calf compression, swelling, warmth, erythema.   Assessment and Plan:  Status post right foot surgery, doing well with no complications   -Treatment options discussed including all alternatives, risks, and complications -She is continue to improve and she is back to regular shoe gear.  Gets occasional discomfort but this seems to be improving as well.  Continue with supportive shoe gear.  Ice elevation in the day particularly to help with any postoperative edema.  Ingrown toenail site is doing well.  At this point we will discharge her from postoperative care and she agrees with this plan has no further questions.  Encouraged to call any changes.  Vivi Barrack DPM

## 2021-06-29 ENCOUNTER — Ambulatory Visit
Admission: RE | Admit: 2021-06-29 | Discharge: 2021-06-29 | Disposition: A | Discharge status: Home / Self Care | Payer: 59 | Source: Ambulatory Visit | Attending: Internal Medicine | Admitting: Internal Medicine

## 2021-06-29 ENCOUNTER — Other Ambulatory Visit: Payer: Self-pay

## 2021-06-29 DIAGNOSIS — Z1231 Encounter for screening mammogram for malignant neoplasm of breast: Secondary | ICD-10-CM | POA: Diagnosis not present

## 2021-07-04 ENCOUNTER — Other Ambulatory Visit: Payer: Self-pay | Admitting: Internal Medicine

## 2021-07-04 DIAGNOSIS — R928 Other abnormal and inconclusive findings on diagnostic imaging of breast: Secondary | ICD-10-CM

## 2021-07-05 DIAGNOSIS — Z113 Encounter for screening for infections with a predominantly sexual mode of transmission: Secondary | ICD-10-CM | POA: Diagnosis not present

## 2021-07-05 DIAGNOSIS — Z124 Encounter for screening for malignant neoplasm of cervix: Secondary | ICD-10-CM | POA: Diagnosis not present

## 2021-07-05 DIAGNOSIS — Z01419 Encounter for gynecological examination (general) (routine) without abnormal findings: Secondary | ICD-10-CM | POA: Diagnosis not present

## 2021-07-08 ENCOUNTER — Other Ambulatory Visit (HOSPITAL_COMMUNITY): Payer: Self-pay

## 2021-07-08 MED ORDER — METRONIDAZOLE 500 MG PO TABS
ORAL_TABLET | ORAL | 0 refills | Status: DC
  Filled 2021-07-08: qty 14, 7d supply, fill #0

## 2021-07-19 ENCOUNTER — Other Ambulatory Visit: Payer: Self-pay | Admitting: Internal Medicine

## 2021-07-19 ENCOUNTER — Telehealth: Payer: Self-pay | Admitting: Internal Medicine

## 2021-07-19 ENCOUNTER — Ambulatory Visit
Admission: RE | Admit: 2021-07-19 | Discharge: 2021-07-19 | Disposition: A | Discharge status: Home / Self Care | Payer: 59 | Source: Ambulatory Visit | Attending: Internal Medicine | Admitting: Internal Medicine

## 2021-07-19 DIAGNOSIS — R928 Other abnormal and inconclusive findings on diagnostic imaging of breast: Secondary | ICD-10-CM

## 2021-07-19 DIAGNOSIS — N6310 Unspecified lump in the right breast, unspecified quadrant: Secondary | ICD-10-CM

## 2021-07-19 DIAGNOSIS — N6001 Solitary cyst of right breast: Secondary | ICD-10-CM | POA: Diagnosis not present

## 2021-07-19 NOTE — Telephone Encounter (Signed)
I have reviewed previous FMLA located in the media tab. Pt would need new forms completed. LVM informing her tht new forms would be needed.

## 2021-07-19 NOTE — Telephone Encounter (Signed)
Patient calling in about FMLA from 01/2021  Patient says she thinks its time to renew & wants to know if she needs to bring in new forms or if previous forms can be used  Req callback [phone]  (Patient says she is currently in class so if she misses call she will call back when available)

## 2021-07-26 NOTE — Telephone Encounter (Signed)
Type of form received: FMLA   Additional comments: Due by the 07/27/2021  Received by: [place]   Form should be Faxed to: [phone]  Form should be mailed to: n/a  Is patient requesting call for pickup: y   Form placed in the Provider's box.  *Attach charge sheet.  Provider will determine charge.*  Was patient informed of  7-10 business day turn around (Y/N)?

## 2021-07-27 NOTE — Telephone Encounter (Signed)
Called pt, LVM.   Needing clarification on if pt needs FMLA or if she needs a release to return to work.   The forms that were dropped off was a "return to work" not [employer]. If FMLA is what she needs we need the correct forms dropped off or faxed to [place].

## 2021-07-27 NOTE — Telephone Encounter (Signed)
Patient says she will have employer fax over correct FMLA forms  Provided patient fax #

## 2021-08-02 NOTE — Telephone Encounter (Signed)
I have spoke to pt and revisited our previous discussion documented below. I also informed her that FMLA was not received via fax. She stated that she would drop a copy off before 5pm today.

## 2021-08-02 NOTE — Telephone Encounter (Signed)
Patient calling back to see if FMLA forms were faxed back to [employer]  They were due in by 10th  Please call the patient, leave a message on her celll [phone]) if she doesn't pick up as she is in a class today

## 2021-08-02 NOTE — Telephone Encounter (Signed)
Patient dropped off FMLA forms  Placed in providers box

## 2021-08-03 DIAGNOSIS — Z0279 Encounter for issue of other medical certificate: Secondary | ICD-10-CM

## 2021-08-03 NOTE — Telephone Encounter (Addendum)
Forms have been signed and faxed back to [employer]   Copy sent to scan Copy given to charge Copy sent to pt  Original filed with CMA

## 2021-08-03 NOTE — Telephone Encounter (Signed)
Forms has been completed and placed on PCPs desk for review and signature.

## 2021-08-12 ENCOUNTER — Other Ambulatory Visit (HOSPITAL_COMMUNITY): Payer: Self-pay

## 2021-08-24 ENCOUNTER — Other Ambulatory Visit (HOSPITAL_COMMUNITY): Payer: Self-pay

## 2021-08-25 ENCOUNTER — Other Ambulatory Visit (HOSPITAL_COMMUNITY): Payer: Self-pay

## 2021-08-26 ENCOUNTER — Other Ambulatory Visit (HOSPITAL_COMMUNITY): Payer: Self-pay

## 2021-09-24 ENCOUNTER — Other Ambulatory Visit (HOSPITAL_COMMUNITY): Payer: Self-pay

## 2021-09-27 DIAGNOSIS — A599 Trichomoniasis, unspecified: Secondary | ICD-10-CM | POA: Diagnosis not present

## 2021-12-01 ENCOUNTER — Other Ambulatory Visit: Payer: Self-pay

## 2021-12-01 ENCOUNTER — Ambulatory Visit: Payer: 59 | Admitting: Internal Medicine

## 2021-12-01 ENCOUNTER — Other Ambulatory Visit (HOSPITAL_COMMUNITY): Payer: Self-pay

## 2021-12-01 ENCOUNTER — Encounter: Payer: Self-pay | Admitting: Internal Medicine

## 2021-12-01 VITALS — BP 128/90 | HR 61 | Resp 18 | Ht 68.0 in | Wt 200.0 lb

## 2021-12-01 DIAGNOSIS — F4321 Adjustment disorder with depressed mood: Secondary | ICD-10-CM

## 2021-12-01 DIAGNOSIS — E876 Hypokalemia: Secondary | ICD-10-CM

## 2021-12-01 DIAGNOSIS — I10 Essential (primary) hypertension: Secondary | ICD-10-CM | POA: Diagnosis not present

## 2021-12-01 LAB — COMPREHENSIVE METABOLIC PANEL
ALT: 17 U/L (ref 0–35)
AST: 15 U/L (ref 0–37)
Albumin: 4 g/dL (ref 3.5–5.2)
Alkaline Phosphatase: 69 U/L (ref 39–117)
BUN: 12 mg/dL (ref 6–23)
CO2: 27 mEq/L (ref 19–32)
Calcium: 9.1 mg/dL (ref 8.4–10.5)
Chloride: 106 mEq/L (ref 96–112)
Creatinine, Ser: 0.91 mg/dL (ref 0.40–1.20)
GFR: 77.69 mL/min (ref >60.00)
Glucose, Bld: 77 mg/dL (ref 70–99)
Potassium: 3.8 mEq/L (ref 3.5–5.1)
Sodium: 139 mEq/L (ref 135–145)
Total Bilirubin: 0.3 mg/dL (ref 0.2–1.2)
Total Protein: 6.9 g/dL (ref 6.0–8.3)

## 2021-12-01 MED ORDER — ALPRAZOLAM 0.25 MG PO TABS
0.2500 mg | ORAL_TABLET | Freq: Two times a day (BID) | ORAL | 0 refills | Status: DC | PRN
  Filled 2021-12-01: qty 20, 10d supply, fill #0

## 2021-12-01 NOTE — Progress Notes (Signed)
° °  Subjective:   Patient ID: Rita Harrison, female    DOB: 03/18/1981, 40 y.o.   MRN: [id number]  HPI The patient is a 40 YO female coming in for mood problem. Some chest tightness/pain.  Review of Systems  Constitutional: Negative.   HENT: Negative.    Eyes: Negative.   Respiratory:  Positive for chest tightness. Negative for cough and shortness of breath.   Cardiovascular:  Negative for chest pain, palpitations and leg swelling.  Gastrointestinal:  Negative for abdominal distention, abdominal pain, constipation, diarrhea, nausea and vomiting.  Musculoskeletal: Negative.   Skin: Negative.   Neurological: Negative.   Psychiatric/Behavioral:  Positive for decreased concentration and dysphoric mood.    Objective:  Physical Exam Constitutional:      Appearance: She is well-developed.  HENT:     Head: Normocephalic and atraumatic.  Cardiovascular:     Rate and Rhythm: Normal rate and regular rhythm.  Pulmonary:     Effort: Pulmonary effort is normal. No respiratory distress.     Breath sounds: Normal breath sounds. No wheezing or rales.  Abdominal:     General: Bowel sounds are normal. There is no distension.     Palpations: Abdomen is soft.     Tenderness: There is no abdominal tenderness. There is no rebound.  Musculoskeletal:     Cervical back: Normal range of motion.  Skin:    General: Skin is warm and dry.  Neurological:     Mental Status: She is alert and oriented to person, place, and time.     Coordination: Coordination normal.    Vitals:   12/01/21 1109  BP: 128/90  Pulse: 61  Resp: 18  SpO2: 99%  Weight: 200 lb (90.7 kg)  Height: 5\' 8"  (1.727 m)   EKG: Rate 59, axis normal, interval normal, sinus brady, no st or t wave changes, no significant change compared to prior 2021  This visit occurred during the SARS-CoV-2 public health emergency.  Safety protocols were in place, including screening questions prior to the visit, additional usage of staff PPE, and  extensive cleaning of exam room while observing appropriate contact time as indicated for disinfecting solutions.   Assessment & Plan:

## 2021-12-01 NOTE — Patient Instructions (Addendum)
We have sent in a short supply of the xanax that you can use if needed to help with the hard times.   If you are having a hard time and it worsens let us know.  We have done an EKG today and are checking the labs. The EKG today was normal.

## 2021-12-02 DIAGNOSIS — F4321 Adjustment disorder with depressed mood: Secondary | ICD-10-CM | POA: Insufficient documentation

## 2021-12-02 NOTE — Assessment & Plan Note (Signed)
Recent loss of several family members in the setting of mild feelings of sadness prior. She does not feel daily medication is needed. She is able to distract herself with work. She is sleeping well. Small supply of xanax 0.25 mg to use as needed in next few weeks and if still having problems or if worsening call or return.

## 2021-12-02 NOTE — Assessment & Plan Note (Signed)
Needs recheck some she stopped hctz. Adjust as needed. She did not start spironolactone as instructed by PCP due to pharmacy not having rx.

## 2021-12-02 NOTE — Assessment & Plan Note (Signed)
EKG done to assess some atypical chest tightness. Likely related to mood currently. EKG not changed from before. BP borderline today and she is asked to monitor at home. She is emotionally upset today so likely at goal. She is taking bystolic 5 mg daily and is not taking spironolactone 50 mg daily as the pharmacy never received this rx. Checking CMP and if normal K will hold off on spironolactone.

## 2022-01-17 ENCOUNTER — Other Ambulatory Visit (HOSPITAL_COMMUNITY): Payer: Self-pay

## 2022-01-17 ENCOUNTER — Other Ambulatory Visit: Payer: Self-pay | Admitting: Internal Medicine

## 2022-01-17 DIAGNOSIS — I10 Essential (primary) hypertension: Secondary | ICD-10-CM

## 2022-01-17 MED ORDER — NEBIVOLOL HCL 5 MG PO TABS
ORAL_TABLET | Freq: Every day | ORAL | 0 refills | Status: DC
  Filled 2022-01-17 (×2): qty 90, 90d supply, fill #0

## 2022-01-17 MED ORDER — ALPRAZOLAM 0.25 MG PO TABS
0.2500 mg | ORAL_TABLET | Freq: Two times a day (BID) | ORAL | 0 refills | Status: DC | PRN
  Filled 2022-01-17: qty 20, 10d supply, fill #0

## 2022-01-18 ENCOUNTER — Other Ambulatory Visit (HOSPITAL_COMMUNITY): Payer: Self-pay

## 2022-01-20 ENCOUNTER — Other Ambulatory Visit: Payer: 59

## 2022-01-24 ENCOUNTER — Other Ambulatory Visit (HOSPITAL_COMMUNITY): Payer: Self-pay

## 2022-01-26 ENCOUNTER — Ambulatory Visit
Admission: RE | Admit: 2022-01-26 | Discharge: 2022-01-26 | Disposition: A | Discharge status: Home / Self Care | Payer: 59 | Source: Ambulatory Visit | Attending: Internal Medicine | Admitting: Internal Medicine

## 2022-01-26 ENCOUNTER — Other Ambulatory Visit: Payer: Self-pay

## 2022-01-26 ENCOUNTER — Other Ambulatory Visit: Payer: Self-pay | Admitting: Internal Medicine

## 2022-01-26 DIAGNOSIS — N6310 Unspecified lump in the right breast, unspecified quadrant: Secondary | ICD-10-CM

## 2022-01-26 DIAGNOSIS — N6001 Solitary cyst of right breast: Secondary | ICD-10-CM | POA: Diagnosis not present

## 2022-02-01 ENCOUNTER — Other Ambulatory Visit: Payer: Self-pay

## 2022-02-17 ENCOUNTER — Telehealth: Payer: Self-pay

## 2022-02-17 NOTE — Telephone Encounter (Signed)
FMLA forms received. However pt needs to be seen for completion.  ? ?Pt has been scheduled for 3/16 @ 9.20am for f/u and to discuss FMLA. Will hold forms for that OV.  ?

## 2022-03-02 ENCOUNTER — Ambulatory Visit: Payer: 59 | Admitting: Internal Medicine

## 2022-03-02 ENCOUNTER — Other Ambulatory Visit: Payer: Self-pay

## 2022-03-02 ENCOUNTER — Telehealth: Payer: Self-pay

## 2022-03-02 ENCOUNTER — Encounter: Payer: Self-pay | Admitting: Internal Medicine

## 2022-03-02 VITALS — BP 138/86 | HR 75 | Temp 98.2°F | Ht 68.0 in | Wt 200.0 lb

## 2022-03-02 DIAGNOSIS — R61 Generalized hyperhidrosis: Secondary | ICD-10-CM | POA: Diagnosis not present

## 2022-03-02 DIAGNOSIS — I10 Essential (primary) hypertension: Secondary | ICD-10-CM | POA: Diagnosis not present

## 2022-03-02 DIAGNOSIS — E876 Hypokalemia: Secondary | ICD-10-CM

## 2022-03-02 DIAGNOSIS — L219 Seborrheic dermatitis, unspecified: Secondary | ICD-10-CM | POA: Diagnosis not present

## 2022-03-02 DIAGNOSIS — I82721 Chronic embolism and thrombosis of deep veins of right upper extremity: Secondary | ICD-10-CM

## 2022-03-02 LAB — MAGNESIUM: Magnesium: 2.4 mg/dL (ref 1.5–2.5)

## 2022-03-02 LAB — CBC WITH DIFFERENTIAL/PLATELET
Basophils Absolute: 0.1 10*3/uL (ref 0.0–0.1)
Basophils Relative: 0.7 % (ref 0.0–3.0)
Eosinophils Absolute: 0.1 10*3/uL (ref 0.0–0.7)
Eosinophils Relative: 1.1 % (ref 0.0–5.0)
HCT: 42 % (ref 36.0–46.0)
Hemoglobin: 14.1 g/dL (ref 12.0–15.0)
Lymphocytes Relative: 25.3 % (ref 12.0–46.0)
Lymphs Abs: 2.7 10*3/uL (ref 0.7–4.0)
MCHC: 33.6 g/dL (ref 30.0–36.0)
MCV: 83.3 fl (ref 78.0–100.0)
Monocytes Absolute: 0.5 10*3/uL (ref 0.1–1.0)
Monocytes Relative: 4.8 % (ref 3.0–12.0)
Neutro Abs: 7.1 10*3/uL (ref 1.4–7.7)
Neutrophils Relative %: 68.1 % (ref 43.0–77.0)
Platelets: 266 10*3/uL (ref 150.0–400.0)
RBC: 5.04 Mil/uL (ref 3.87–5.11)
RDW: 14.5 % (ref 11.5–15.5)
WBC: 10.5 10*3/uL (ref 4.0–10.5)

## 2022-03-02 LAB — BASIC METABOLIC PANEL
BUN: 19 mg/dL (ref 6–23)
CO2: 28 mEq/L (ref 19–32)
Calcium: 9.6 mg/dL (ref 8.4–10.5)
Chloride: 104 mEq/L (ref 96–112)
Creatinine, Ser: 1.04 mg/dL (ref 0.40–1.20)
GFR: 66.07 mL/min (ref >60.00)
Glucose, Bld: 101 mg/dL — ABNORMAL HIGH (ref 70–99)
Potassium: 4.1 mEq/L (ref 3.5–5.1)
Sodium: 139 mEq/L (ref 135–145)

## 2022-03-02 LAB — TSH: TSH: 1.04 u[IU]/mL (ref 0.35–5.50)

## 2022-03-02 NOTE — Progress Notes (Signed)
? ?Subjective:  ?Patient ID: Rita Harrison, female    DOB: March 11, 1981  Age: 41 y.o. MRN: [id number] ? ?CC: Rash and Hypertension ? ?This visit occurred during the SARS-CoV-2 public health emergency.  Safety protocols were in place, including screening questions prior to the visit, additional usage of staff PPE, and extensive cleaning of exam room while observing appropriate contact time as indicated for disinfecting solutions.   ? ?HPI ?Rita Harrison presents for f/up -  ? ?She complains of a 3 month hx of scalp rash. She is not getting much symptom relief with a topical steroid. The rash itches. ? ?She also complains of excessive armpit sweating. ? ?She does not monitor her BP. She is active an denies DOE, CP, edema. ? ?Outpatient Medications Prior to Visit  ?Medication Sig Dispense Refill  ? ALPRAZolam (XANAX) 0.25 MG tablet Take 1 tablet (0.25 mg total) by mouth 2 (two) times daily as needed for anxiety. 20 tablet 0  ? nebivolol (BYSTOLIC) 5 MG tablet TAKE 1 TABLET BY MOUTH ONCE A DAY 90 tablet 0  ? spironolactone (ALDACTONE) 50 MG tablet Take 1 tablet (50 mg total) by mouth daily. (Patient not taking: Reported on 12/01/2021) 90 tablet 0  ? ?No facility-administered medications prior to visit.  ? ? ?ROS ?Review of Systems  ?Constitutional:  Positive for unexpected weight change (wt gain). Negative for appetite change, chills, diaphoresis and fatigue.  ?HENT: Negative.    ?Eyes: Negative.   ?Respiratory:  Negative for cough, chest tightness, shortness of breath and wheezing.   ?Cardiovascular:  Negative for chest pain, palpitations and leg swelling.  ?Gastrointestinal:  Negative for abdominal pain, blood in stool, constipation, diarrhea, nausea and vomiting.  ?Endocrine: Negative.   ?Genitourinary: Negative.  Negative for difficulty urinating.  ?Musculoskeletal:  Positive for arthralgias. Negative for myalgias.  ?     Intermittent RUE pain  ?Skin:  Positive for rash. Negative for color change.   ?Neurological:  Negative for dizziness, weakness and numbness.  ?Hematological:  Negative for adenopathy. Does not bruise/bleed easily.  ?Psychiatric/Behavioral: Negative.    ? ?Objective:  ?BP 138/86   Pulse 75   Temp 98.2 ?F (36.8 ?C) (Oral)   Ht 5\' 8"  (1.727 m)   Wt 200 lb (90.7 kg)   SpO2 93%   BMI 30.41 kg/m?  ? ?BP Readings from Last 3 Encounters:  ?03/02/22 138/86  ?12/01/21 128/90  ?05/31/21 (!) 158/96  ? ? ?Wt Readings from Last 3 Encounters:  ?03/02/22 200 lb (90.7 kg)  ?12/01/21 200 lb (90.7 kg)  ?05/31/21 192 lb (87.1 kg)  ? ? ?Physical Exam ?Vitals reviewed.  ?Constitutional:   ?   Appearance: Normal appearance.  ?HENT:  ?   Nose: Nose normal.  ?   Mouth/Throat:  ?   Mouth: Mucous membranes are moist.  ?Eyes:  ?   General: No scleral icterus. ?   Conjunctiva/sclera: Conjunctivae normal.  ?Cardiovascular:  ?   Rate and Rhythm: Normal rate and regular rhythm.  ?   Heart sounds: No murmur heard. ?Pulmonary:  ?   Effort: Pulmonary effort is normal.  ?   Breath sounds: No stridor. No wheezing, rhonchi or rales.  ?Abdominal:  ?   General: Abdomen is flat.  ?   Palpations: There is no mass.  ?   Tenderness: There is no abdominal tenderness. There is no guarding.  ?   Hernia: No hernia is present.  ?Musculoskeletal:  ?   Cervical back: Neck supple.  ?  Right lower leg: No edema.  ?   Left lower leg: No edema.  ?Lymphadenopathy:  ?   Cervical: No cervical adenopathy.  ?Skin: ?   General: Skin is warm and dry.  ?   Findings: Rash present.  ?   Comments: + diffuse white scalp scale  ?Neurological:  ?   General: No focal deficit present.  ?   Mental Status: She is alert.  ?Psychiatric:     ?   Mood and Affect: Mood normal.     ?   Behavior: Behavior normal.  ? ? ?Lab Results  ?Component Value Date  ? WBC 10.5 03/02/2022  ? HGB 14.1 03/02/2022  ? HCT 42.0 03/02/2022  ? PLT 266.0 03/02/2022  ? GLUCOSE 101 (H) 03/02/2022  ? CHOL 180 11/09/2020  ? TRIG 82.0 11/09/2020  ? HDL 41.30 11/09/2020  ? LDLCALC 122 (H)  11/09/2020  ? ALT 17 12/01/2021  ? AST 15 12/01/2021  ? NA 139 03/02/2022  ? K 4.1 03/02/2022  ? CL 104 03/02/2022  ? CREATININE 1.04 03/02/2022  ? BUN 19 03/02/2022  ? CO2 28 03/02/2022  ? TSH 1.04 03/02/2022  ? INR 1.08 04/23/2013  ? HGBA1C 5.6 02/21/2018  ? ? ?US BREAST LTD UNI RIGHT INC AXILLA ? ?Result Date: 01/26/2022 ?CLINICAL DATA:  Follow-up for probably benign mass in the RIGHT breast. EXAM: ULTRASOUND OF THE RIGHT BREAST COMPARISON:  Previous exams including RIGHT breast ultrasound dated 07/19/2021. FINDINGS: Targeted ultrasound is performed, showing an oval circumscribed nearly anechoic mass in the RIGHT breast at the 9 o'clock axis, 6 cm from the nipple, measuring 6 x 5 x 5 mm, without internal vascularity, stable to slightly smaller compared to the previous ultrasound, most likely a benign cyst. Again demonstrated is an adjacent benign cyst in the RIGHT breast at the 9 o'clock axis, 5 cm from the nipple, as previously described and corresponding as incidental finding. IMPRESSION: Probably benign cyst in the RIGHT breast at the 9 o'clock axis, 6 cm from the nipple, measuring 6 x 5 x 5 mm, stable to slightly smaller compared to the previous ultrasound. Recommend additional follow-up diagnostic exam in 6 months to ensure continued stability. RECOMMENDATION: Bilateral diagnostic mammogram, and RIGHT breast ultrasound, in 6 months. I have discussed the findings and recommendations with the patient. If applicable, a reminder letter will be sent to the patient regarding the next appointment. BI-RADS CATEGORY  3: Probably benign. Electronically Signed   By: Rita Harrison M.D.   On: 01/26/2022 13:14 ? ? ?Assessment & Plan:  ? ?[name] was seen today for rash and hypertension. ? ?Diagnoses and all orders for this visit: ? ?Seborrheic dermatitis of scalp ?-     Fluocinolone Acetonide Body (DERMA-SMOOTHE/FS BODY) 0.01 % OIL; Apply topically at bedtime as directed. ?-     Ambulatory referral to  Dermatology ? ?Essential hypertension- Her BP is not at goal (130/80) - She will improve her lifestyle modifications. ?-     CBC with Differential/Platelet; Future ?-     TSH; Future ?-     Magnesium; Future ?-     TSH ?-     CBC with Differential/Platelet ?-     Magnesium ? ?Chronic embolism and thrombosis of deep vein of right upper extremity ([place])- No evidence of recurrence. ?-     D-dimer, quantitative; Future ?-     D-dimer, quantitative ? ?Chronic hypokalemia- Her K+ is normal now. Will screen for secondary causes. ?-     Basic metabolic  panel; Future ?-     Aldosterone + renin activity w/ ratio; Future ?-     Magnesium; Future ?-     Aldosterone + renin activity w/ ratio ?-     Basic metabolic panel ?-     Magnesium ? ?Perspiration excessive- Zinc is normal. ?-     Zinc; Future ?-     Zinc ?-     Aluminum Chloride in Alcohol 6.25 % SOLN; Apply 1 Act topically at bedtime. ? ? ?I have discontinued Lakie L. Pendelton's spironolactone. I am also having her start on Fluocinolone Acetonide Body and Aluminum Chloride in Alcohol. Additionally, I am having her maintain her nebivolol and ALPRAZolam. ? ?Meds ordered this encounter  ?Medications  ? Fluocinolone Acetonide Body (DERMA-SMOOTHE/FS BODY) 0.01 % OIL  ?  Sig: Apply topically at bedtime as directed.  ?  Dispense:  118 mL  ?  Refill:  1  ? Aluminum Chloride in Alcohol 6.25 % SOLN  ?  Sig: Apply 1 Act topically at bedtime.  ?  Dispense:  60 mL  ?  Refill:  3  ? ? ? ?Follow-up: Return in about 3 months (around 06/02/2022). ? ?Scarlette Calico, MD ?

## 2022-03-02 NOTE — Patient Instructions (Signed)

## 2022-03-03 ENCOUNTER — Telehealth: Payer: Self-pay | Admitting: Internal Medicine

## 2022-03-03 ENCOUNTER — Other Ambulatory Visit (HOSPITAL_COMMUNITY): Payer: Self-pay

## 2022-03-03 MED ORDER — FLUOCINOLONE ACETONIDE BODY 0.01 % EX OIL
1.0000 | TOPICAL_OIL | Freq: Every day | CUTANEOUS | 1 refills | Status: DC
  Filled 2022-03-03: qty 118, 30d supply, fill #0
  Filled 2022-05-30: qty 118, 30d supply, fill #1

## 2022-03-03 NOTE — Telephone Encounter (Signed)
Pt states fmla forms has to be submitted by 03-05-2022 ? ?Pt requesting a cb w/ a status update ?

## 2022-03-03 NOTE — Telephone Encounter (Signed)
Pt states she was to have a rx for deodorant sent to [facility] ? ?As discussed in yesterdays ov w/ provider ? ?Please advise ? ? ?

## 2022-03-05 LAB — ZINC: Zinc: 64 ug/dL (ref 60–130)

## 2022-03-06 ENCOUNTER — Other Ambulatory Visit (HOSPITAL_COMMUNITY): Payer: Self-pay

## 2022-03-06 DIAGNOSIS — Z0279 Encounter for issue of other medical certificate: Secondary | ICD-10-CM

## 2022-03-06 MED ORDER — ALUMINUM CHLORIDE IN ALCOHOL 6.25 % EX SOLN
1.0000 | Freq: Every day | CUTANEOUS | 3 refills | Status: DC
  Filled 2022-03-06: qty 60, 30d supply, fill #0
  Filled 2022-05-30: qty 60, 30d supply, fill #1

## 2022-03-06 NOTE — Telephone Encounter (Signed)
Forms have been completed, sign, and faxed.  ? ?Copy sent to charge. ?Copy sent to pt ?Original filed with CMA ?

## 2022-03-06 NOTE — Telephone Encounter (Signed)
Pt was advised that forms can take up to 7-10 Business days to be completed.  ? ?Pt was informed that the FMLA paperwork will be completed and submitted by the end of the week.  ? ?Pt will call back to let us know if the FMLA paperwork can still be submitted or if it needs to be restarted due to it being submitted after the requested date. ? ?FYI ?

## 2022-03-07 ENCOUNTER — Other Ambulatory Visit (HOSPITAL_COMMUNITY): Payer: Self-pay

## 2022-03-09 ENCOUNTER — Telehealth: Payer: Self-pay | Admitting: Dermatology

## 2022-03-09 NOTE — Telephone Encounter (Signed)
Notes documented and referral routed back to referring office. 

## 2022-03-09 NOTE — Telephone Encounter (Signed)
Patient is calling for a referral appointment from Scarlette Calico, M.D. but does not want to wait until November 2023 for an appointment so would like referral sent back to Dr. Ronnald Ramp' office. ?

## 2022-03-14 LAB — D-DIMER, QUANTITATIVE: D-Dimer, Quant: 0.27 mcg/mL FEU (ref <0.50)

## 2022-03-14 LAB — ALDOSTERONE + RENIN ACTIVITY W/ RATIO
ALDO / PRA Ratio: 72.2 Ratio — ABNORMAL HIGH (ref 0.9–28.9)
Aldosterone: 13 ng/dL
Renin Activity: 0.18 ng/mL/h — ABNORMAL LOW (ref 0.25–5.82)

## 2022-05-02 NOTE — Progress Notes (Signed)
? ? ?Subjective:  ? ? Patient ID: Rita Harrison, female    DOB: [birth year], 41 y.o.   MRN: [id number] ? ? ? ? ? ?HPI ?Mahari is here for  ?Chief Complaint  ?Patient presents with  ? Hypertension  ? ? ? ?High BP readings - she is in pre-menopause.  She wakes up very hot, sweating and that occurs intermittently.  She is not sure if that is related to her blood pressure or not..   ? ?She takes Bystolic daily in am.  Yesterday at work felt lightheaded and had to sit - BP was 190/110 on an automatic BP cuff.  She had nausea and they gave her something for it.  Manual BP 160/90 short time later.  EKG - Nml.  She relaxed for 30 min and went home.  She got home at 11:30 - had not eaten anything.  Laid down and ate something.  Had some vertigo.  Laid down a little more. ? ?Today she did feel better, but does feel a little tired from everything but her body went there yesterday.  She does tolerate the Bystolic well.  She has not tolerated several other medications in the past-allergy to lisinopril.  Indapamide, triamterene-hydrochlorothiazide and plain hydrochlorothiazide have caused low potassium.  At one point she was prescribed spironolactone, but looks like she never started it. ? ?She does eat healthy. ?  ? ? ?Medications and allergies reviewed with patient and updated if appropriate. ? ?Current Outpatient Medications on File Prior to Visit  ?Medication Sig Dispense Refill  ? ALPRAZolam (XANAX) 0.25 MG tablet Take 1 tablet (0.25 mg total) by mouth 2 (two) times daily as needed for anxiety. 20 tablet 0  ? Aluminum Chloride in Alcohol 6.25 % SOLN Apply 1 actuation topically at bedtime as directed 60 mL 3  ? Fluocinolone Acetonide Body (DERMA-SMOOTHE/FS BODY) 0.01 % OIL Apply topically at bedtime as directed. 118 mL 1  ? nebivolol (BYSTOLIC) 5 MG tablet TAKE 1 TABLET BY MOUTH ONCE A DAY 90 tablet 0  ? [DISCONTINUED] triamterene-hydrochlorothiazide (DYAZIDE) 37.5-25 MG capsule Take 1 each (1 capsule total) by mouth  daily. 90 capsule 0  ? ?No current facility-administered medications on file prior to visit.  ? ? ?Review of Systems  ?Constitutional:  Negative for fever.  ?HENT:  Negative for congestion, ear pain, sinus pain and sore throat.   ?Eyes:  Negative for visual disturbance.  ?Respiratory:  Negative for cough, shortness of breath and wheezing.   ?Cardiovascular:  Positive for chest pain (sharp pain last night after eating pasta). Negative for palpitations and leg swelling.  ?Gastrointestinal:  Positive for nausea.  ?Neurological:  Positive for dizziness, light-headedness and headaches.  ? ?   ?Objective:  ? ?Vitals:  ? 05/03/22 1350  ?BP: 140/90  ?Pulse: 65  ?Temp: 98 ?F (36.7 ?C)  ?SpO2: 97%  ? ?BP Readings from Last 3 Encounters:  ?05/03/22 140/90  ?03/02/22 138/86  ?12/01/21 128/90  ? ?Wt Readings from Last 3 Encounters:  ?05/03/22 198 lb (89.8 kg)  ?03/02/22 200 lb (90.7 kg)  ?12/01/21 200 lb (90.7 kg)  ? ?Body mass index is 30.11 kg/m?. ? ?  ?Physical Exam ?Constitutional:   ?   General: She is not in acute distress. ?   Appearance: Normal appearance.  ?HENT:  ?   Head: Normocephalic and atraumatic.  ?Eyes:  ?   Conjunctiva/sclera: Conjunctivae normal.  ?Cardiovascular:  ?   Rate and Rhythm: Normal rate and regular rhythm.  ?  Heart sounds: Normal heart sounds. No murmur heard. ?Pulmonary:  ?   Effort: Pulmonary effort is normal. No respiratory distress.  ?   Breath sounds: Normal breath sounds. No wheezing.  ?Musculoskeletal:  ?   Cervical back: Neck supple.  ?   Right lower leg: No edema.  ?   Left lower leg: No edema.  ?Lymphadenopathy:  ?   Cervical: No cervical adenopathy.  ?Skin: ?   General: Skin is warm and dry.  ?   Findings: No rash.  ?Neurological:  ?   Mental Status: She is alert. Mental status is at baseline.  ?Psychiatric:     ?   Mood and Affect: Mood normal.     ?   Behavior: Behavior normal.  ? ?   ? ? ? ? ? ?Assessment & Plan:  ? ? ?Hypertension: ?Chronic ?Blood pressure not ideally  controlled ?Continue Bystolic 5 mg daily ?Discussed that we need to adjust medications since her blood pressure is not ideally controlled, but should continue low-sodium diet and ideally regular exercise and work on weight, which may help ?Discussed possibly trying a higher dose of Bystolic, but there is concern it may lower her heart rate too much ?We will start spironolactone 12.5 mg daily-advised her to monitor her BP at home regularly more at work.  If BP is consistently elevated will need to consider elevating spironolactone to 25 mg daily ?Follow-up in 2 weeks-Will need BMP at that time to recheck potassium, GFR ? ? ? ? ?

## 2022-05-03 ENCOUNTER — Ambulatory Visit: Payer: 59 | Admitting: Internal Medicine

## 2022-05-03 ENCOUNTER — Encounter: Payer: Self-pay | Admitting: Internal Medicine

## 2022-05-03 ENCOUNTER — Other Ambulatory Visit (HOSPITAL_COMMUNITY): Payer: Self-pay

## 2022-05-03 VITALS — BP 140/90 | HR 65 | Temp 98.0°F | Ht 68.0 in | Wt 198.0 lb

## 2022-05-03 DIAGNOSIS — I10 Essential (primary) hypertension: Secondary | ICD-10-CM

## 2022-05-03 MED ORDER — SPIRONOLACTONE 25 MG PO TABS
12.5000 mg | ORAL_TABLET | Freq: Every day | ORAL | 5 refills | Status: DC
  Filled 2022-05-03: qty 30, 60d supply, fill #0

## 2022-05-03 NOTE — Patient Instructions (Addendum)
? ? ? ? ?  Medications changes include :  start spironolactone 12.5 mg daily  ? ? ? ?Your prescription(s) have been sent to your pharmacy.  ? ? ? ? ?Return in about 2 weeks (around 05/17/2022) for hypertension. ? ?

## 2022-05-04 ENCOUNTER — Other Ambulatory Visit (HOSPITAL_COMMUNITY): Payer: Self-pay

## 2022-05-04 ENCOUNTER — Other Ambulatory Visit: Payer: Self-pay | Admitting: Internal Medicine

## 2022-05-04 MED ORDER — ALPRAZOLAM 0.25 MG PO TABS
0.2500 mg | ORAL_TABLET | Freq: Two times a day (BID) | ORAL | 1 refills | Status: DC | PRN
  Filled 2022-05-04 – 2022-09-28 (×2): qty 20, 10d supply, fill #0

## 2022-05-06 ENCOUNTER — Other Ambulatory Visit (HOSPITAL_COMMUNITY): Payer: Self-pay

## 2022-05-06 ENCOUNTER — Other Ambulatory Visit: Payer: Self-pay | Admitting: Internal Medicine

## 2022-05-06 DIAGNOSIS — I10 Essential (primary) hypertension: Secondary | ICD-10-CM

## 2022-05-06 MED ORDER — NEBIVOLOL HCL 5 MG PO TABS
5.0000 mg | ORAL_TABLET | Freq: Every day | ORAL | 0 refills | Status: DC
  Filled 2022-05-06 – 2022-05-13 (×2): qty 90, 90d supply, fill #0

## 2022-05-13 ENCOUNTER — Other Ambulatory Visit (HOSPITAL_COMMUNITY): Payer: Self-pay

## 2022-05-13 ENCOUNTER — Other Ambulatory Visit: Payer: Self-pay

## 2022-05-16 ENCOUNTER — Encounter: Payer: Self-pay | Admitting: Internal Medicine

## 2022-05-16 NOTE — Progress Notes (Unsigned)
      Subjective:    Patient ID: Rita Harrison, female    DOB: 09-09-1981, 41 y.o.   MRN: [id number]     HPI Rita Harrison is here for follow up of her chronic medical problems, including htn  3 weeks ago we started spironolactone for her uncontrolled BP.  Medications and allergies reviewed with patient and updated if appropriate.  Current Outpatient Medications on File Prior to Visit  Medication Sig Dispense Refill   ALPRAZolam (XANAX) 0.25 MG tablet Take 1 tablet (0.25 mg total) by mouth 2 (two) times daily as needed for anxiety. 20 tablet 1   Aluminum Chloride in Alcohol 6.25 % SOLN Apply 1 actuation topically at bedtime as directed 60 mL 3   Fluocinolone Acetonide Body (DERMA-SMOOTHE/FS BODY) 0.01 % OIL Apply topically at bedtime as directed. 118 mL 1   nebivolol (BYSTOLIC) 5 MG tablet Take 1 tablet (5 mg total) by mouth daily. 90 tablet 0   spironolactone (ALDACTONE) 25 MG tablet Take 1/2 tablet (12.5 mg total) by mouth daily. 30 tablet 5   [DISCONTINUED] triamterene-hydrochlorothiazide (DYAZIDE) 37.5-25 MG capsule Take 1 each (1 capsule total) by mouth daily. 90 capsule 0   No current facility-administered medications on file prior to visit.     Review of Systems     Objective:  There were no vitals filed for this visit. BP Readings from Last 3 Encounters:  05/03/22 140/90  03/02/22 138/86  12/01/21 128/90   Wt Readings from Last 3 Encounters:  05/03/22 198 lb (89.8 kg)  03/02/22 200 lb (90.7 kg)  12/01/21 200 lb (90.7 kg)   There is no height or weight on file to calculate BMI.    Physical Exam     Lab Results  Component Value Date   WBC 10.5 03/02/2022   HGB 14.1 03/02/2022   HCT 42.0 03/02/2022   PLT 266.0 03/02/2022   GLUCOSE 101 (H) 03/02/2022   CHOL 180 11/09/2020   TRIG 82.0 11/09/2020   HDL 41.30 11/09/2020   LDLCALC 122 (H) 11/09/2020   ALT 17 12/01/2021   AST 15 12/01/2021   NA 139 03/02/2022   K 4.1 03/02/2022   CL 104 03/02/2022    CREATININE 1.04 03/02/2022   BUN 19 03/02/2022   CO2 28 03/02/2022   TSH 1.04 03/02/2022   INR 1.08 04/23/2013   HGBA1C 5.6 02/21/2018     Assessment & Plan:    Hypertension: Chronic BP well controlled Continue  bmp

## 2022-05-17 ENCOUNTER — Ambulatory Visit: Payer: 59 | Admitting: Internal Medicine

## 2022-05-17 ENCOUNTER — Other Ambulatory Visit (HOSPITAL_COMMUNITY): Payer: Self-pay

## 2022-05-17 VITALS — BP 140/94 | HR 59 | Temp 98.0°F | Ht 68.0 in | Wt 198.0 lb

## 2022-05-17 DIAGNOSIS — I10 Essential (primary) hypertension: Secondary | ICD-10-CM | POA: Diagnosis not present

## 2022-05-17 LAB — BASIC METABOLIC PANEL
BUN: 17 mg/dL (ref 6–23)
CO2: 25 mEq/L (ref 19–32)
Calcium: 9.8 mg/dL (ref 8.4–10.5)
Chloride: 102 mEq/L (ref 96–112)
Creatinine, Ser: 1 mg/dL (ref 0.40–1.20)
GFR: 69.15 mL/min (ref >60.00)
Glucose, Bld: 84 mg/dL (ref 70–99)
Potassium: 3.9 mEq/L (ref 3.5–5.1)
Sodium: 135 mEq/L (ref 135–145)

## 2022-05-17 MED ORDER — HYDROCHLOROTHIAZIDE 12.5 MG PO TABS
12.5000 mg | ORAL_TABLET | Freq: Every day | ORAL | 5 refills | Status: DC
  Filled 2022-05-17: qty 30, 30d supply, fill #0

## 2022-05-17 NOTE — Patient Instructions (Addendum)
     [facility] [id number] [place] [address] [phone] Fax [phone]    Blood work was ordered.     Medications changes include :  start hydrochlorothiazide 12.5 mg daily    Your prescription(s) have been sent to your pharmacy.     Return for 2-3 weeks - follow up hypertension.

## 2022-05-18 NOTE — Addendum Note (Signed)
Addended by: Pincus Sanes on: 05/18/2022 07:24 AM   Modules accepted: Orders

## 2022-05-19 ENCOUNTER — Other Ambulatory Visit (HOSPITAL_COMMUNITY): Payer: Self-pay

## 2022-05-19 ENCOUNTER — Other Ambulatory Visit: Payer: Self-pay | Admitting: Internal Medicine

## 2022-05-19 MED ORDER — KETOCONAZOLE 2 % EX SHAM
MEDICATED_SHAMPOO | CUTANEOUS | 2 refills | Status: AC
  Filled 2022-05-19: qty 120, 30d supply, fill #0

## 2022-05-29 ENCOUNTER — Encounter (INDEPENDENT_AMBULATORY_CARE_PROVIDER_SITE_OTHER): Payer: 59 | Admitting: Internal Medicine

## 2022-05-29 DIAGNOSIS — I10 Essential (primary) hypertension: Secondary | ICD-10-CM

## 2022-05-30 ENCOUNTER — Other Ambulatory Visit: Payer: Self-pay | Admitting: Internal Medicine

## 2022-05-30 ENCOUNTER — Other Ambulatory Visit (HOSPITAL_COMMUNITY): Payer: Self-pay

## 2022-05-30 DIAGNOSIS — I10 Essential (primary) hypertension: Secondary | ICD-10-CM

## 2022-05-30 MED ORDER — INDAPAMIDE 1.25 MG PO TABS
1.2500 mg | ORAL_TABLET | Freq: Every day | ORAL | 0 refills | Status: DC
  Filled 2022-05-30: qty 90, 90d supply, fill #0

## 2022-05-30 NOTE — Telephone Encounter (Signed)
Please see the MyChart message reply(ies) for my assessment and plan.  The patient gave consent for this Medical Advice Message and is aware that it may result in a bill to their insurance company as well as the possibility that this may result in a co-payment or deductible. They are an established patient, but are not seeking medical advice exclusively about a problem treated during an in person or video visit in the last 7 days. I did not recommend an in person or video visit within 7 days of my reply.  I spent a total of 10 minutes cumulative time within 7 days through MyChart messaging Kyce Ging, MD  

## 2022-06-05 ENCOUNTER — Encounter: Payer: Self-pay | Admitting: Internal Medicine

## 2022-06-06 ENCOUNTER — Other Ambulatory Visit: Payer: Self-pay | Admitting: Internal Medicine

## 2022-06-06 ENCOUNTER — Other Ambulatory Visit (HOSPITAL_COMMUNITY): Payer: Self-pay

## 2022-06-06 DIAGNOSIS — I10 Essential (primary) hypertension: Secondary | ICD-10-CM

## 2022-06-06 MED ORDER — AMLODIPINE BESYLATE 5 MG PO TABS
5.0000 mg | ORAL_TABLET | Freq: Every day | ORAL | 0 refills | Status: DC
  Filled 2022-06-06: qty 90, 90d supply, fill #0

## 2022-06-07 ENCOUNTER — Other Ambulatory Visit (HOSPITAL_COMMUNITY): Payer: Self-pay

## 2022-06-09 ENCOUNTER — Other Ambulatory Visit (HOSPITAL_COMMUNITY): Payer: Self-pay

## 2022-06-12 ENCOUNTER — Other Ambulatory Visit (HOSPITAL_COMMUNITY): Payer: Self-pay

## 2022-06-26 ENCOUNTER — Other Ambulatory Visit (HOSPITAL_COMMUNITY): Payer: Self-pay

## 2022-07-13 NOTE — Progress Notes (Unsigned)
Subjective:    Patient ID: Rita Harrison, female    DOB: 07-16-1981, 41 y.o.   MRN: [id number]     HPI Rita Harrison is here for follow up of her chronic medical problems, including htn  She is taking her medication daily as prescribed.  She does not check her blood pressure regularly because she does not have a blood pressure cuff, but can have it checked at work which she has not done recently.  She denies any headaches, lightheadedness, chest pain or palpitations.  Medications and allergies reviewed with patient and updated if appropriate.  Current Outpatient Medications on File Prior to Visit  Medication Sig Dispense Refill   ALPRAZolam (XANAX) 0.25 MG tablet Take 1 tablet (0.25 mg total) by mouth 2 (two) times daily as needed for anxiety. 20 tablet 1   Aluminum Chloride in Alcohol 6.25 % SOLN Apply 1 actuation topically at bedtime as directed 60 mL 3   amLODipine (NORVASC) 5 MG tablet Take 1 tablet (5 mg total) by mouth daily. 90 tablet 0   Fluocinolone Acetonide Body (DERMA-SMOOTHE/FS BODY) 0.01 % OIL Apply topically at bedtime as directed. 118 mL 1   ketoconazole (NIZORAL) 2 % shampoo SHAMPOO SCALP, LET SIT 5 MINUTES, THEN RINSE OUT. 120 mL 2   nebivolol (BYSTOLIC) 5 MG tablet Take 1 tablet (5 mg total) by mouth daily. 90 tablet 0   No current facility-administered medications on file prior to visit.     Review of Systems  Respiratory:  Negative for shortness of breath.   Cardiovascular:  Negative for chest pain, palpitations and leg swelling.  Neurological:  Negative for light-headedness and headaches.       Objective:   Vitals:   07/14/22 1353  BP: 140/88  Pulse: 75  Temp: 98.4 F (36.9 C)  SpO2: 96%   BP Readings from Last 3 Encounters:  07/14/22 140/88  05/17/22 (!) 140/94  05/03/22 140/90   Wt Readings from Last 3 Encounters:  07/14/22 202 lb (91.6 kg)  05/17/22 198 lb (89.8 kg)  05/03/22 198 lb (89.8 kg)   Body mass index is 30.71 kg/m.     Physical Exam Constitutional:      General: She is not in acute distress.    Appearance: Normal appearance.  HENT:     Head: Normocephalic and atraumatic.  Eyes:     Conjunctiva/sclera: Conjunctivae normal.  Cardiovascular:     Rate and Rhythm: Normal rate and regular rhythm.     Heart sounds: Normal heart sounds. No murmur heard. Pulmonary:     Effort: Pulmonary effort is normal. No respiratory distress.     Breath sounds: Normal breath sounds. No wheezing.  Musculoskeletal:     Right lower leg: No edema.     Left lower leg: No edema.  Skin:    General: Skin is warm and dry.     Findings: No rash.  Neurological:     Mental Status: She is alert. Mental status is at baseline.  Psychiatric:        Mood and Affect: Mood normal.        Behavior: Behavior normal.        Lab Results  Component Value Date   WBC 10.5 03/02/2022   HGB 14.1 03/02/2022   HCT 42.0 03/02/2022   PLT 266.0 03/02/2022   GLUCOSE 84 05/17/2022   CHOL 180 11/09/2020   TRIG 82.0 11/09/2020   HDL 41.30 11/09/2020   LDLCALC 122 (H) 11/09/2020  ALT 17 12/01/2021   AST 15 12/01/2021   NA 135 05/17/2022   K 3.9 05/17/2022   CL 102 05/17/2022   CREATININE 1.00 05/17/2022   BUN 17 05/17/2022   CO2 25 05/17/2022   TSH 1.04 03/02/2022   INR 1.08 04/23/2013   HGBA1C 5.6 02/21/2018     Assessment & Plan:    Hypertension: Chronic Not ideally controlled Increase bystolic to 10mg  daily Continue amlodipine 5 mg daily Advised her to monitor BP and HR at work and update so adjustments can be made We are limited by medication - can not take ACE-I or ARB and has not tolerated indapamide, spironolactone Has done ok with hctz so we can restart that if needed, but will nee KCl with it

## 2022-07-14 ENCOUNTER — Encounter: Payer: Self-pay | Admitting: Internal Medicine

## 2022-07-14 ENCOUNTER — Other Ambulatory Visit (HOSPITAL_COMMUNITY): Payer: Self-pay

## 2022-07-14 ENCOUNTER — Ambulatory Visit: Payer: 59 | Admitting: Internal Medicine

## 2022-07-14 VITALS — BP 140/88 | HR 75 | Temp 98.4°F | Ht 68.0 in | Wt 202.0 lb

## 2022-07-14 DIAGNOSIS — I10 Essential (primary) hypertension: Secondary | ICD-10-CM

## 2022-07-14 DIAGNOSIS — Z01419 Encounter for gynecological examination (general) (routine) without abnormal findings: Secondary | ICD-10-CM | POA: Diagnosis not present

## 2022-07-14 DIAGNOSIS — N926 Irregular menstruation, unspecified: Secondary | ICD-10-CM | POA: Diagnosis not present

## 2022-07-14 DIAGNOSIS — Z832 Family history of diseases of the blood and blood-forming organs and certain disorders involving the immune mechanism: Secondary | ICD-10-CM

## 2022-07-14 DIAGNOSIS — Z113 Encounter for screening for infections with a predominantly sexual mode of transmission: Secondary | ICD-10-CM | POA: Diagnosis not present

## 2022-07-14 MED ORDER — CLINDAMYCIN PHOSPHATE 1 % EX LOTN
TOPICAL_LOTION | CUTANEOUS | 1 refills | Status: DC
  Filled 2022-07-14: qty 60, 30d supply, fill #0
  Filled 2023-01-29 (×2): qty 60, 30d supply, fill #1

## 2022-07-14 MED ORDER — NEBIVOLOL HCL 10 MG PO TABS
10.0000 mg | ORAL_TABLET | Freq: Every day | ORAL | 3 refills | Status: DC
  Filled 2022-07-14 – 2022-08-07 (×2): qty 90, 90d supply, fill #0
  Filled 2022-11-24: qty 90, 90d supply, fill #1
  Filled 2023-02-17: qty 90, 90d supply, fill #2
  Filled 2023-05-18 – 2023-06-05 (×2): qty 90, 90d supply, fill #3

## 2022-07-14 NOTE — Patient Instructions (Addendum)
     Medications changes include :   increase bystolic 10 mg daily   Your prescription(s) have been sent to your pharmacy.    A referral was ordered for Dr Duke Salvia for your BP.     Someone from that office will call you to schedule an appointment.    Return for 2-4 week f/u htn with PCP or me.    [facility] [address] of [address]

## 2022-07-15 ENCOUNTER — Other Ambulatory Visit (HOSPITAL_COMMUNITY): Payer: Self-pay

## 2022-07-26 ENCOUNTER — Other Ambulatory Visit (HOSPITAL_COMMUNITY): Payer: Self-pay

## 2022-07-28 ENCOUNTER — Other Ambulatory Visit: Payer: 59

## 2022-07-28 ENCOUNTER — Other Ambulatory Visit (HOSPITAL_COMMUNITY): Payer: Self-pay

## 2022-08-01 ENCOUNTER — Ambulatory Visit
Admission: RE | Admit: 2022-08-01 | Discharge: 2022-08-01 | Disposition: A | Discharge status: Home / Self Care | Payer: 59 | Source: Ambulatory Visit | Attending: Internal Medicine | Admitting: Internal Medicine

## 2022-08-01 ENCOUNTER — Other Ambulatory Visit (HOSPITAL_COMMUNITY): Payer: Self-pay

## 2022-08-01 DIAGNOSIS — R922 Inconclusive mammogram: Secondary | ICD-10-CM | POA: Diagnosis not present

## 2022-08-01 DIAGNOSIS — N6310 Unspecified lump in the right breast, unspecified quadrant: Secondary | ICD-10-CM

## 2022-08-01 DIAGNOSIS — N6311 Unspecified lump in the right breast, upper outer quadrant: Secondary | ICD-10-CM | POA: Diagnosis not present

## 2022-08-01 DIAGNOSIS — N6313 Unspecified lump in the right breast, lower outer quadrant: Secondary | ICD-10-CM | POA: Diagnosis not present

## 2022-08-03 ENCOUNTER — Telehealth: Payer: Self-pay

## 2022-08-03 DIAGNOSIS — Z0289 Encounter for other administrative examinations: Secondary | ICD-10-CM

## 2022-08-03 NOTE — Telephone Encounter (Signed)
Forms have been completed and given to PCP to sign 

## 2022-08-03 NOTE — Telephone Encounter (Signed)
Forms have been signed and faxed back  Copy sent to pt via mail Copy sent to charge Original filed with CMA

## 2022-08-03 NOTE — Telephone Encounter (Signed)
[employer] has been received

## 2022-08-07 ENCOUNTER — Other Ambulatory Visit (HOSPITAL_COMMUNITY): Payer: Self-pay

## 2022-08-31 ENCOUNTER — Other Ambulatory Visit (HOSPITAL_COMMUNITY): Payer: Self-pay

## 2022-09-05 ENCOUNTER — Encounter: Payer: Self-pay | Admitting: Internal Medicine

## 2022-09-07 ENCOUNTER — Ambulatory Visit (HOSPITAL_BASED_OUTPATIENT_CLINIC_OR_DEPARTMENT_OTHER): Payer: 59 | Admitting: Family

## 2022-09-08 ENCOUNTER — Encounter: Payer: Self-pay | Admitting: Family Medicine

## 2022-09-08 ENCOUNTER — Other Ambulatory Visit (HOSPITAL_COMMUNITY): Payer: Self-pay

## 2022-09-08 ENCOUNTER — Telehealth (INDEPENDENT_AMBULATORY_CARE_PROVIDER_SITE_OTHER): Payer: 59 | Admitting: Family Medicine

## 2022-09-08 DIAGNOSIS — R110 Nausea: Secondary | ICD-10-CM | POA: Insufficient documentation

## 2022-09-08 DIAGNOSIS — R140 Abdominal distension (gaseous): Secondary | ICD-10-CM | POA: Diagnosis not present

## 2022-09-08 DIAGNOSIS — G44209 Tension-type headache, unspecified, not intractable: Secondary | ICD-10-CM | POA: Insufficient documentation

## 2022-09-08 DIAGNOSIS — K219 Gastro-esophageal reflux disease without esophagitis: Secondary | ICD-10-CM

## 2022-09-08 MED ORDER — ONDANSETRON 4 MG PO TBDP
4.0000 mg | ORAL_TABLET | Freq: Three times a day (TID) | ORAL | 0 refills | Status: DC | PRN
  Filled 2022-09-08: qty 20, 7d supply, fill #0

## 2022-09-08 MED ORDER — OMEPRAZOLE 20 MG PO CPDR
20.0000 mg | DELAYED_RELEASE_CAPSULE | Freq: Every day | ORAL | 1 refills | Status: DC
  Filled 2022-09-08: qty 30, 30d supply, fill #0
  Filled 2023-01-29 – 2023-06-04 (×3): qty 30, 30d supply, fill #1

## 2022-09-08 MED ORDER — IBUPROFEN 800 MG PO TABS
800.0000 mg | ORAL_TABLET | Freq: Three times a day (TID) | ORAL | 0 refills | Status: DC | PRN
  Filled 2022-09-08: qty 30, 10d supply, fill #0

## 2022-09-08 NOTE — Progress Notes (Signed)
MyChart Video Visit    Virtual Visit via Video Note   This visit type was conducted due to national recommendations for restrictions regarding the COVID-19 Pandemic (e.g. social distancing) in an effort to limit this patient's exposure and mitigate transmission in our community. This patient is at least at moderate risk for complications without adequate follow up. This format is felt to be most appropriate for this patient at this time. Physical exam was limited by quality of the video and audio technology used for the visit. CMA was able to get the patient set up on a video visit.  Patient location: Home. Patient and provider in visit Provider location: Office  I discussed the limitations of evaluation and management by telemedicine and the availability of in person appointments. The patient expressed understanding and agreed to proceed.  Visit Date: 09/08/2022  Today's healthcare provider: Hetty Blend, NP-C     Subjective:    Patient ID: Rita Harrison, female    DOB: 1981-04-23, 41 y.o.   MRN: [id number]  Chief Complaint  Patient presents with  . Headache    Started Sunday    HPI  Visit is for refill request of ibuprofen and Zofran.  C/o 6 day hx of headache when she wakes up.  Pain is right forehead and spreads across her entire forehead unless she takes medication.  Hx of stress headaches or headaches when she skips meals.    States she has been out of ibuprofen 800 mg which usually works to relieve headaches. She has associated nausea and takes Zofran for it but has been out.  She has been taking BC powder.   Denies fever, chills, dizziness, vision changes, ear pain, URI symptoms, numbness, tingling or weakness.    She also reports acid reflux and abdominal bloating.  She is taking probiotics.  No vomiting.    Past Medical History:  Diagnosis Date  . Anxiety   . Arm DVT (deep venous thromboembolism), chronic (HCC)    RUE  . Chicken pox   .  Hypertension   . Sickle cell anemia (HCC)    sickle cell trait    History reviewed. No pertinent surgical history.  Family History  Problem Relation Age of Onset  . Sickle cell anemia Father   . Kidney cancer Father   . Breast cancer Maternal Grandmother     Social History   Socioeconomic History  . Marital status: Single    Spouse name: Not on file  . Number of children: 1  . Years of education: 64  . Highest education level: Not on file  Occupational History  . Occupation: CNA  Tobacco Use  . Smoking status: Former    Packs/day: 0.50    Years: 10.00    Total pack years: 5.00    Types: Cigarettes  . Smokeless tobacco: Never  Vaping Use  . Vaping Use: Former  Substance and Sexual Activity  . Alcohol use: Not Currently    Comment: occasionally  . Drug use: Yes    Types: Marijuana    Comment: occasionally  . Sexual activity: Not Currently    Partners: Male  Other Topics Concern  . Not on file  Social History Narrative   Fun: Travel, family, son's football games    Denies religious beliefs effecting health care   Feels safe at home and denies abuse.    Social Determinants of Health   Financial Resource Strain: Not on file  Food Insecurity: Not on file  Transportation Needs:  Not on file  Physical Activity: Not on file  Stress: Not on file  Social Connections: Not on file  Intimate Partner Violence: Not on file    Outpatient Medications Prior to Visit  Medication Sig Dispense Refill  . ALPRAZolam (XANAX) 0.25 MG tablet Take 1 tablet (0.25 mg total) by mouth 2 (two) times daily as needed for anxiety. 20 tablet 1  . Aluminum Chloride in Alcohol 6.25 % SOLN Apply 1 actuation topically at bedtime as directed 60 mL 3  . amLODipine (NORVASC) 5 MG tablet Take 1 tablet (5 mg total) by mouth daily. 90 tablet 0  . clindamycin (CLEOCIN T) 1 % lotion APPLY A THIN LAYER TO THE AFFECTED AREA(S) 2 TIMES PER DAY AS NEEDED 60 mL 1  . Fluocinolone Acetonide Body  (DERMA-SMOOTHE/FS BODY) 0.01 % OIL Apply topically at bedtime as directed. 118 mL 1  . ketoconazole (NIZORAL) 2 % shampoo SHAMPOO SCALP, LET SIT 5 MINUTES, THEN RINSE OUT. 120 mL 2  . nebivolol (BYSTOLIC) 10 MG tablet Take 1 tablet (10 mg total) by mouth daily. 90 tablet 3   No facility-administered medications prior to visit.    Allergies  Allergen Reactions  . Lisinopril Shortness Of Breath and Itching    angioedema  . Indapamide Rash  . Spironolactone Other (See Comments)    itching    ROS     Objective:    Physical Exam Constitutional:      General: She is not in acute distress.    Appearance: She is not ill-appearing.  Neurological:     Mental Status: She is alert and oriented to person, place, and time.     Cranial Nerves: No facial asymmetry.  Psychiatric:        Mood and Affect: Mood normal.        Speech: Speech normal.        Behavior: Behavior normal.    There were no vitals taken for this visit. Wt Readings from Last 3 Encounters:  07/14/22 202 lb (91.6 kg)  05/17/22 198 lb (89.8 kg)  05/03/22 198 lb (89.8 kg)       Assessment & Plan:   Problem List Items Addressed This Visit   None Visit Diagnoses     Acute non intractable tension-type headache    -  Primary   Relevant Medications   omeprazole (PRILOSEC) 20 MG capsule   ondansetron (ZOFRAN-ODT) 4 MG disintegrating tablet   ibuprofen (ADVIL) 800 MG tablet   Nausea       Relevant Medications   ondansetron (ZOFRAN-ODT) 4 MG disintegrating tablet   Abdominal bloating       Gastroesophageal reflux disease, unspecified whether esophagitis present       Relevant Medications   omeprazole (PRILOSEC) 20 MG capsule   ondansetron (ZOFRAN-ODT) 4 MG disintegrating tablet       I am having Darren L. Koudelka start on omeprazole, ondansetron, and ibuprofen. I am also having her maintain her Fluocinolone Acetonide Body, Aluminum Chloride in Alcohol, ALPRAZolam, ketoconazole, amLODipine, nebivolol, and  clindamycin.  Meds ordered this encounter  Medications  . omeprazole (PRILOSEC) 20 MG capsule    Sig: Take 1 capsule (20 mg total) by mouth daily.    Dispense:  30 capsule    Refill:  1    Order Specific Question:   Supervising Provider    Answer:   Hillard Danker A [[id number]]  . ondansetron (ZOFRAN-ODT) 4 MG disintegrating tablet    Sig: Dissolve 1 tablet (4 mg total) by  mouth every 8 (eight) hours as needed for nausea or vomiting.    Dispense:  20 tablet    Refill:  0    Order Specific Question:   Supervising Provider    Answer:   Pricilla Holm A [[id number]]  . ibuprofen (ADVIL) 800 MG tablet    Sig: Take 1 tablet (800 mg total) by mouth every 8 (eight) hours as needed.    Dispense:  30 tablet    Refill:  0    Order Specific Question:   Supervising Provider    Answer:   Pricilla Holm A [[id number]]    I discussed the assessment and treatment plan with the patient. The patient was provided an opportunity to ask questions and all were answered. The patient agreed with the plan and demonstrated an understanding of the instructions.   The patient was advised to call back or seek an in-person evaluation if the symptoms worsen or if the condition fails to improve as anticipated.  I provided 27 minutes of face-to-face time during this encounter.   Harland Dingwall, NP-C [facility] [phone] (phone) [phone] (fax)  [place]

## 2022-09-09 NOTE — Assessment & Plan Note (Signed)
She will stop BC powders.  Refilled ibuprofen.  We discussed rebound headaches.  Stay hydrated.  Avoid fluctuations in caffeine.  Continue getting regular sleep.  Look for triggers and follow-up if not improving.

## 2022-09-09 NOTE — Assessment & Plan Note (Signed)
Appears to be related to recent headaches.  Zofran prescribed.  She will also try omeprazole for acid reflux

## 2022-09-09 NOTE — Assessment & Plan Note (Signed)
Omeprazole prescribed.  Avoid triggers.  Avoid eating and laying down.  Follow-up if worsening or not improving with medication and lifestyle modifications

## 2022-09-09 NOTE — Assessment & Plan Note (Signed)
No red flag symptoms.  Discussed avoiding foods high in gas production and carbonated beverages.  Eat a low-fat diet.  Start omeprazole.  Continue probiotic.  Follow-up if worsening or not improving in the next 2 to 4 weeks.

## 2022-09-13 ENCOUNTER — Encounter: Payer: Self-pay | Admitting: Family Medicine

## 2022-09-14 ENCOUNTER — Ambulatory Visit: Payer: 59 | Admitting: Family Medicine

## 2022-09-14 ENCOUNTER — Other Ambulatory Visit (HOSPITAL_COMMUNITY): Payer: Self-pay

## 2022-09-14 ENCOUNTER — Encounter: Payer: Self-pay | Admitting: Family Medicine

## 2022-09-14 VITALS — BP 160/108 | HR 75 | Temp 97.6°F | Ht 68.0 in | Wt 205.0 lb

## 2022-09-14 DIAGNOSIS — Z832 Family history of diseases of the blood and blood-forming organs and certain disorders involving the immune mechanism: Secondary | ICD-10-CM

## 2022-09-14 DIAGNOSIS — I10 Essential (primary) hypertension: Secondary | ICD-10-CM | POA: Diagnosis not present

## 2022-09-14 DIAGNOSIS — G44209 Tension-type headache, unspecified, not intractable: Secondary | ICD-10-CM

## 2022-09-14 LAB — BASIC METABOLIC PANEL
BUN: 15 mg/dL (ref 6–23)
CO2: 29 mEq/L (ref 19–32)
Calcium: 9.1 mg/dL (ref 8.4–10.5)
Chloride: 102 mEq/L (ref 96–112)
Creatinine, Ser: 1.03 mg/dL (ref 0.40–1.20)
GFR: 66.59 mL/min (ref >60.00)
Glucose, Bld: 95 mg/dL (ref 70–99)
Potassium: 3.6 mEq/L (ref 3.5–5.1)
Sodium: 137 mEq/L (ref 135–145)

## 2022-09-14 MED ORDER — TRIAMTERENE-HCTZ 37.5-25 MG PO CAPS
1.0000 | ORAL_CAPSULE | Freq: Every day | ORAL | 0 refills | Status: DC
  Filled 2022-09-14: qty 30, 30d supply, fill #0

## 2022-09-14 MED ORDER — KETOROLAC TROMETHAMINE 60 MG/2ML IM SOLN
60.0000 mg | Freq: Once | INTRAMUSCULAR | Status: AC
  Administered 2022-09-14: 60 mg via INTRAMUSCULAR

## 2022-09-14 NOTE — Assessment & Plan Note (Signed)
Start triamterene-HCTZ 37.5-25mg  daily.  Continue Bystoloic 10 mg and amlodipine 5 mg.  She will keep an eye on BP and follow up in 4 weeks.

## 2022-09-14 NOTE — Assessment & Plan Note (Signed)
Toradol 60 mg IM injection given in office. No sign of infection.  Encouraged her to cut back on ibuprofen. Ok to take Tylenol.  Follow up if headaches are worsening or not improving as her BP improves.

## 2022-09-14 NOTE — Progress Notes (Signed)
Subjective:     Patient ID: Rita Harrison, female    DOB: Aug 19, 1981, 41 y.o.   MRN: [id number]  Chief Complaint  Patient presents with   Hypertension    BP check per Dr. Ronnald Ramp    HPI Patient is in today for follow up on elevated BP and 11 day hx of headaches. Pain is dull and behind the right eye. Headaches are waking her up around 4 am.  Drinking plenty of water. No change caffeine. Sleeping more now due to pain.   Taking ibuprofen 800 mg, ES Tylenol bid.   She has taken HCTZ in the past but her potassium was low.   Denies fever, chills, dizziness, chest pain, palpitations, shortness of breath, abdominal pain, N/V/D, urinary symptoms, LE edema.     Health Maintenance Due  Topic Date Due   HIV Screening  Never done   Hepatitis C Screening  Never done   COVID-19 Vaccine (3 - Pfizer series) 07/09/2020   PAP SMEAR-Modifier  01/29/2022   INFLUENZA VACCINE  07/18/2022    Past Medical History:  Diagnosis Date   Anxiety    Arm DVT (deep venous thromboembolism), chronic (HCC)    RUE   Chicken pox    Hypertension    Sickle cell anemia (HCC)    sickle cell trait    No past surgical history on file.  Family History  Problem Relation Age of Onset   Sickle cell anemia Father    Kidney cancer Father    Breast cancer Maternal Grandmother     Social History   Socioeconomic History   Marital status: Single    Spouse name: Not on file   Number of children: 1   Years of education: 13   Highest education level: Not on file  Occupational History   Occupation: CNA  Tobacco Use   Smoking status: Former    Packs/day: 0.50    Years: 10.00    Total pack years: 5.00    Types: Cigarettes   Smokeless tobacco: Never  Vaping Use   Vaping Use: Former  Substance and Sexual Activity   Alcohol use: Not Currently    Comment: occasionally   Drug use: Yes    Types: Marijuana    Comment: occasionally   Sexual activity: Not Currently    Partners: Male  Other Topics  Concern   Not on file  Social History Narrative   Fun: Travel, family, son's football games    Denies religious beliefs effecting health care   Feels safe at home and denies abuse.    Social Determinants of Health   Financial Resource Strain: Not on file  Food Insecurity: Not on file  Transportation Needs: Not on file  Physical Activity: Not on file  Stress: Not on file  Social Connections: Not on file  Intimate Partner Violence: Not on file    Outpatient Medications Prior to Visit  Medication Sig Dispense Refill   ALPRAZolam (XANAX) 0.25 MG tablet Take 1 tablet (0.25 mg total) by mouth 2 (two) times daily as needed for anxiety. 20 tablet 1   Aluminum Chloride in Alcohol 6.25 % SOLN Apply 1 actuation topically at bedtime as directed 60 mL 3   amLODipine (NORVASC) 5 MG tablet Take 1 tablet (5 mg total) by mouth daily. 90 tablet 0   clindamycin (CLEOCIN T) 1 % lotion APPLY A THIN LAYER TO THE AFFECTED AREA(S) 2 TIMES PER DAY AS NEEDED 60 mL 1   Fluocinolone Acetonide Body (DERMA-SMOOTHE/FS BODY)  0.01 % OIL Apply topically at bedtime as directed. 118 mL 1   ibuprofen (ADVIL) 800 MG tablet Take 1 tablet (800 mg total) by mouth every 8 (eight) hours as needed. 30 tablet 0   ketoconazole (NIZORAL) 2 % shampoo SHAMPOO SCALP, LET SIT 5 MINUTES, THEN RINSE OUT. 120 mL 2   nebivolol (BYSTOLIC) 10 MG tablet Take 1 tablet (10 mg total) by mouth daily. 90 tablet 3   omeprazole (PRILOSEC) 20 MG capsule Take 1 capsule (20 mg total) by mouth daily. 30 capsule 1   ondansetron (ZOFRAN-ODT) 4 MG disintegrating tablet Dissolve 1 tablet (4 mg total) by mouth every 8 (eight) hours as needed for nausea or vomiting. 20 tablet 0   No facility-administered medications prior to visit.    Allergies  Allergen Reactions   Lisinopril Shortness Of Breath and Itching    angioedema   Indapamide Rash   Spironolactone Other (See Comments)    itching    ROS     Objective:    Physical Exam Constitutional:       General: She is not in acute distress.    Appearance: She is not ill-appearing.  HENT:     Right Ear: Tympanic membrane and ear canal normal.     Left Ear: Tympanic membrane and ear canal normal.     Nose:     Right Sinus: No maxillary sinus tenderness or frontal sinus tenderness.     Left Sinus: No maxillary sinus tenderness or frontal sinus tenderness.  Eyes:     General: Lids are normal. Vision grossly intact.     Extraocular Movements: Extraocular movements intact.     Conjunctiva/sclera: Conjunctivae normal.  Cardiovascular:     Rate and Rhythm: Normal rate and regular rhythm.     Heart sounds: Normal heart sounds.  Pulmonary:     Effort: Pulmonary effort is normal.     Breath sounds: Normal breath sounds.  Musculoskeletal:        General: Normal range of motion.     Cervical back: Normal range of motion and neck supple.     Right lower leg: No edema.     Left lower leg: No edema.  Skin:    General: Skin is warm and dry.  Neurological:     General: No focal deficit present.     Mental Status: She is alert and oriented to person, place, and time.     Cranial Nerves: No cranial nerve deficit.     Sensory: No sensory deficit.     Motor: No weakness.     Coordination: Coordination normal.     Gait: Gait normal.  Psychiatric:        Mood and Affect: Mood normal.        Behavior: Behavior normal.        Thought Content: Thought content normal.     BP (!) 160/108 (BP Location: Left Arm, Patient Position: Sitting, Cuff Size: Large)   Pulse 75   Temp 97.6 F (36.4 C) (Temporal)   Ht 5\' 8"  (1.727 m)   Wt 205 lb (93 kg)   SpO2 99%   BMI 31.17 kg/m  Wt Readings from Last 3 Encounters:  09/14/22 205 lb (93 kg)  07/14/22 202 lb (91.6 kg)  05/17/22 198 lb (89.8 kg)       Assessment & Plan:   Problem List Items Addressed This Visit       Cardiovascular and Mediastinum   Uncontrolled hypertension - Primary  Start triamterene-HCTZ 37.5-25mg  daily.  Continue  Bystoloic 10 mg and amlodipine 5 mg.  She will keep an eye on BP and follow up in 4 weeks.       Relevant Medications   triamterene-hydrochlorothiazide (DYAZIDE) 37.5-25 MG capsule     Other   Acute non intractable tension-type headache    Toradol 60 mg IM injection given in office. No sign of infection.  Encouraged her to cut back on ibuprofen. Ok to take Tylenol.  Follow up if headaches are worsening or not improving as her BP improves.       Other Visit Diagnoses     Family history of sickle cell disease           I am having Rosan L. Podgurski start on triamterene-hydrochlorothiazide. I am also having her maintain her Fluocinolone Acetonide Body, Aluminum Chloride in Alcohol, ALPRAZolam, ketoconazole, amLODipine, nebivolol, clindamycin, omeprazole, ondansetron, and ibuprofen. We administered ketorolac.  Meds ordered this encounter  Medications   triamterene-hydrochlorothiazide (DYAZIDE) 37.5-25 MG capsule    Sig: Take 1 each (1 capsule total) by mouth daily.    Dispense:  30 capsule    Refill:  0    Order Specific Question:   Supervising Provider    Answer:   Hillard Danker A [[id number]]   ketorolac (TORADOL) injection 60 mg

## 2022-09-14 NOTE — Patient Instructions (Addendum)
Start taking new medication triamterene-HCTZ 37.5-25 mg once daily. Continue taking amlodipine 5 mg and Bystolic 10 mg.  Limit sodium.  Keep a close eye on your blood pressure.  Cut back on ibuprofen.  Try Tylenol and caffeine.  Follow-up with Dr. Ronnald Ramp in 4 weeks, before you run out of the new blood pressure medication.   DASH Eating Plan DASH stands for Dietary Approaches to Stop Hypertension. The DASH eating plan is a healthy eating plan that has been shown to: Reduce high blood pressure (hypertension). Reduce your risk for type 2 diabetes, heart disease, and stroke. Help with weight loss. What are tips for following this plan? Reading food labels Check food labels for the amount of salt (sodium) per serving. Choose foods with less than 5 percent of the Daily Value of sodium. Generally, foods with less than 300 milligrams (mg) of sodium per serving fit into this eating plan. To find whole grains, look for the word "whole" as the first word in the ingredient list. Shopping Buy products labeled as "low-sodium" or "no salt added." Buy fresh foods. Avoid canned foods and pre-made or frozen meals. Cooking Avoid adding salt when cooking. Use salt-free seasonings or herbs instead of table salt or sea salt. Check with your health care provider or pharmacist before using salt substitutes. Do not fry foods. Cook foods using healthy methods such as baking, boiling, grilling, roasting, and broiling instead. Cook with heart-healthy oils, such as olive, canola, avocado, soybean, or sunflower oil. Meal planning  Eat a balanced diet that includes: 4 or more servings of fruits and 4 or more servings of vegetables each day. Try to fill one-half of your plate with fruits and vegetables. 6-8 servings of whole grains each day. Less than 6 oz (170 g) of lean meat, poultry, or fish each day. A 3-oz (85-g) serving of meat is about the same size as a deck of cards. One egg equals 1 oz (28 g). 2-3  servings of low-fat dairy each day. One serving is 1 cup (237 mL). 1 serving of nuts, seeds, or beans 5 times each week. 2-3 servings of heart-healthy fats. Healthy fats called omega-3 fatty acids are found in foods such as walnuts, flaxseeds, fortified milks, and eggs. These fats are also found in cold-water fish, such as sardines, salmon, and mackerel. Limit how much you eat of: Canned or prepackaged foods. Food that is high in trans fat, such as some fried foods. Food that is high in saturated fat, such as fatty meat. Desserts and other sweets, sugary drinks, and other foods with added sugar. Full-fat dairy products. Do not salt foods before eating. Do not eat more than 4 egg yolks a week. Try to eat at least 2 vegetarian meals a week. Eat more home-cooked food and less restaurant, buffet, and fast food. Lifestyle When eating at a restaurant, ask that your food be prepared with less salt or no salt, if possible. If you drink alcohol: Limit how much you use to: 0-1 drink a day for women who are not pregnant. 0-2 drinks a day for men. Be aware of how much alcohol is in your drink. In the [place] one drink equals one 12 oz bottle of beer (355 mL), one 5 oz glass of wine (148 mL), or one 1 oz glass of hard liquor (44 mL). General information Avoid eating more than 2,300 mg of salt a day. If you have hypertension, you may need to reduce your sodium intake to 1,500 mg a day. Work  with your health care provider to maintain a healthy body weight or to lose weight. Ask what an ideal weight is for you. Get at least 30 minutes of exercise that causes your heart to beat faster (aerobic exercise) most days of the week. Activities may include walking, swimming, or biking. Work with your health care provider or dietitian to adjust your eating plan to your individual calorie needs. What foods should I eat? Fruits All fresh, dried, or frozen fruit. Canned fruit in natural juice (without added  sugar). Vegetables Fresh or frozen vegetables (raw, steamed, roasted, or grilled). Low-sodium or reduced-sodium tomato and vegetable juice. Low-sodium or reduced-sodium tomato sauce and tomato paste. Low-sodium or reduced-sodium canned vegetables. Grains Whole-grain or whole-wheat bread. Whole-grain or whole-wheat pasta. Brown rice. [place]. Bulgur. Whole-grain and low-sodium cereals. Pita bread. Low-fat, low-sodium crackers. Whole-wheat flour tortillas. Meats and other proteins Skinless chicken or [place]. Ground chicken or [place]. Pork with fat trimmed off. Fish and seafood. Egg whites. Dried beans, peas, or lentils. Unsalted nuts, nut butters, and seeds. Unsalted canned beans. Lean cuts of beef with fat trimmed off. Low-sodium, lean precooked or cured meat, such as sausages or meat loaves. Dairy Low-fat (1%) or fat-free (skim) milk. Reduced-fat, low-fat, or fat-free cheeses. Nonfat, low-sodium ricotta or cottage cheese. Low-fat or nonfat yogurt. Low-fat, low-sodium cheese. Fats and oils Soft margarine without trans fats. Vegetable oil. Reduced-fat, low-fat, or light mayonnaise and salad dressings (reduced-sodium). Canola, safflower, olive, avocado, soybean, and sunflower oils. Avocado. Seasonings and condiments Herbs. Spices. Seasoning mixes without salt. Other foods Unsalted popcorn and pretzels. Fat-free sweets. The items listed above may not be a complete list of foods and beverages you can eat. Contact a dietitian for more information. What foods should I avoid? Fruits Canned fruit in a light or heavy syrup. Fried fruit. Fruit in cream or butter sauce. Vegetables Creamed or fried vegetables. Vegetables in a cheese sauce. Regular canned vegetables (not low-sodium or reduced-sodium). Regular canned tomato sauce and paste (not low-sodium or reduced-sodium). Regular tomato and vegetable juice (not low-sodium or reduced-sodium). Angie Fava. Olives. Grains Baked goods made with fat, such  as croissants, muffins, or some breads. Dry pasta or rice meal packs. Meats and other proteins Fatty cuts of meat. Ribs. Fried meat. Berniece Salines. Bologna, salami, and other precooked or cured meats, such as sausages or meat loaves. Fat from the back of a pig (fatback). Bratwurst. Salted nuts and seeds. Canned beans with added salt. Canned or smoked fish. Whole eggs or egg yolks. Chicken or [place] with skin. Dairy Whole or 2% milk, cream, and half-and-half. Whole or full-fat cream cheese. Whole-fat or sweetened yogurt. Full-fat cheese. Nondairy creamers. Whipped toppings. Processed cheese and cheese spreads. Fats and oils Butter. Stick margarine. Lard. Shortening. Ghee. Bacon fat. Tropical oils, such as coconut, palm kernel, or palm oil. Seasonings and condiments Onion salt, garlic salt, seasoned salt, table salt, and sea salt. Worcestershire sauce. Tartar sauce. Barbecue sauce. Teriyaki sauce. Soy sauce, including reduced-sodium. Steak sauce. Canned and packaged gravies. Fish sauce. Oyster sauce. Cocktail sauce. Store-bought horseradish. Ketchup. Mustard. Meat flavorings and tenderizers. Bouillon cubes. Hot sauces. Pre-made or packaged marinades. Pre-made or packaged taco seasonings. Relishes. Regular salad dressings. Other foods Salted popcorn and pretzels. The items listed above may not be a complete list of foods and beverages you should avoid. Contact a dietitian for more information. Where to find more information National Heart, Lung, and Blood Institute: https://wilson-eaton.com/ American Heart Association: www.heart.org Academy of Nutrition and Dietetics: www.eatright.[place]: www.kidney.org  Summary The DASH eating plan is a healthy eating plan that has been shown to reduce high blood pressure (hypertension). It may also reduce your risk for type 2 diabetes, heart disease, and stroke. When on the DASH eating plan, aim to eat more fresh fruits and vegetables, whole grains, lean  proteins, low-fat dairy, and heart-healthy fats. With the DASH eating plan, you should limit salt (sodium) intake to 2,300 mg a day. If you have hypertension, you may need to reduce your sodium intake to 1,500 mg a day. Work with your health care provider or dietitian to adjust your eating plan to your individual calorie needs. This information is not intended to replace advice given to you by your health care provider. Make sure you discuss any questions you have with your health care provider. Document Revised: 11/07/2019 Document Reviewed: 11/07/2019 Elsevier Patient Education  [place].

## 2022-09-15 LAB — SICKLE CELL SCREEN: Sickle Solubility Test - HGBRFX: NEGATIVE

## 2022-09-28 ENCOUNTER — Other Ambulatory Visit (HOSPITAL_COMMUNITY): Payer: Self-pay

## 2022-09-28 ENCOUNTER — Encounter: Payer: Self-pay | Admitting: Family Medicine

## 2022-09-28 ENCOUNTER — Other Ambulatory Visit: Payer: Self-pay | Admitting: Internal Medicine

## 2022-09-28 DIAGNOSIS — I10 Essential (primary) hypertension: Secondary | ICD-10-CM

## 2022-09-28 MED ORDER — AMLODIPINE BESYLATE 5 MG PO TABS
5.0000 mg | ORAL_TABLET | Freq: Every day | ORAL | 0 refills | Status: DC
  Filled 2022-09-28: qty 90, 90d supply, fill #0

## 2022-09-29 ENCOUNTER — Other Ambulatory Visit (HOSPITAL_COMMUNITY): Payer: Self-pay

## 2022-10-03 ENCOUNTER — Encounter: Payer: Self-pay | Admitting: Internal Medicine

## 2022-10-04 ENCOUNTER — Other Ambulatory Visit: Payer: Self-pay | Admitting: Internal Medicine

## 2022-10-04 DIAGNOSIS — G44209 Tension-type headache, unspecified, not intractable: Secondary | ICD-10-CM

## 2022-10-05 ENCOUNTER — Encounter: Payer: Self-pay | Admitting: Physician Assistant

## 2022-10-05 ENCOUNTER — Encounter (HOSPITAL_BASED_OUTPATIENT_CLINIC_OR_DEPARTMENT_OTHER): Payer: Self-pay | Admitting: Family

## 2022-10-05 ENCOUNTER — Ambulatory Visit (INDEPENDENT_AMBULATORY_CARE_PROVIDER_SITE_OTHER): Payer: 59 | Admitting: Family

## 2022-10-05 VITALS — BP 152/94 | HR 72 | Ht 66.5 in | Wt 205.0 lb

## 2022-10-05 DIAGNOSIS — I10 Essential (primary) hypertension: Secondary | ICD-10-CM

## 2022-10-05 DIAGNOSIS — G4730 Sleep apnea, unspecified: Secondary | ICD-10-CM | POA: Diagnosis not present

## 2022-10-05 DIAGNOSIS — R0683 Snoring: Secondary | ICD-10-CM | POA: Diagnosis not present

## 2022-10-05 DIAGNOSIS — Z006 Encounter for examination for normal comparison and control in clinical research program: Secondary | ICD-10-CM

## 2022-10-05 NOTE — Research (Signed)
  Subject Name: Rita Harrison  Rita Harrison Ha met inclusion and exclusion criteria for the Virtual Care and Social Determinant Interventions for the management of hypertension trial.  The informed consent form, study requirements and expectations were reviewed with the subject by Dr. Oval Linsey and myself. The subject was given the opportunity to read the consent and ask questions. The subject verbalized understanding of the trial requirements.  All questions were addressed prior to the signing of the consent form. The subject agreed to participate in the trial and signed the informed consent. The informed consent was obtained prior to performance of any protocol-specific procedures for the subject.  A copy of the signed informed consent was given to the subject and a copy was placed in the subject's medical record.  Rita Harrison was randomized to Group 1.

## 2022-10-05 NOTE — Patient Instructions (Addendum)
Medication Instructions:  Your physician has recommended you make the following change in your medication:   Hold Amlodipine- Yahir Tavano or Urban Gibson will send you a mychart message on Monday to see if the headaches are any better off the meds. DO NOT THROW IT AWAY UNTIL YOU HEAR FROM [place]  *If you need a refill on your cardiac medications before your next appointment, please call your pharmacy*   Lab Work: Please return for Lab work in the next week or two fasting and between 8-9am for Catecholamines, metanephrines, and cortisol. You may come to the...   [facility] (3rd floor) [address], [place], [place] [address]  Open: 8am-Noon and 1pm-4:30pm  Please ring the doorbell on the small table when you exit the elevator and the Lab Tech will come get you  [place] [facility] [address] [place], [place], [place] [address] Open: 8am-1pm, then 2pm-4:30pm   [place]- Please see attached locations sheet stapled to your lab work with address and hours.   If you have labs (blood work) drawn today and your tests are completely normal, you will receive your results only by: [place] (if you have MyChart) OR A paper copy in the mail If you have any lab test that is abnormal or we need to change your treatment, we will call you to review the results.   Testing/Procedures: WatchPAT?  Is a FDA cleared portable home sleep study test that uses a watch and 3 points of contact to monitor 7 different channels, including your heart rate, oxygen saturations, body position, snoring, and chest motion.  The study is easy to use from the comfort of your own home and accurately detect sleep apnea.  Before bed, you attach the chest sensor, attached the sleep apnea bracelet to your nondominant hand, and attach the finger probe.  After the study, the raw data is downloaded from the watch and scored for apnea events.   For more information:  [email or web address]  Patient Testing Instructions:  Do not put battery into the device until bedtime when you are ready to begin the test. Please call the support number if you need assistance after following the instructions below: 24 hour support line- [phone] or [name] support at [phone] (option 2)  Download the [employer] AmericanWatchPAT One" app through the google play store or App Store  Be sure to turn on or enable access to bluetooth in settlings on your smartphone/ device  Make sure no other bluetooth devices are on and within the vicinity of your smartphone/ device and WatchPAT watch during testing.  Make sure to leave your smart phone/ device plugged in and charging all night.  When ready for bed:  Follow the instructions step by step in the WatchPAT One App to activate the testing device. For additional instructions, including video instruction, visit the WatchPAT One video on Youtube. You can search for [place] One within Youtube (video is 4 minutes and 18 seconds) or enter: https://youtube/watch?[email or web address]_vbiwxE Please note: You will be prompted to enter a Pin to connect via bluetooth when starting the test. The PIN will be assigned to you when you receive the test.  The device is disposable, but it recommended that you retain the device until you receive a call letting you know the study has been received and the results have been interpreted.  We will let you know if the study did not transmit to [place] properly after the test is completed. You do not need to call us  to confirm the receipt of the test.  Please complete the test within 48 hours of receiving PIN.   Frequently Asked Questions:  What is Watch [place] one?  A single use fully disposable home sleep apnea testing device and will not need to be returned after completion.  What are the requirements to use WatchPAT one?  The be able to have a successful watchpat one sleep study, you should have your Watch pat one  device, your smart phone, watch pat one app, your PIN number and Internet access What type of phone do I need?  You should have a smart phone that uses Android 5.1 and above or any Iphone with IOS 10 and above How can I download the WatchPAT one app?  Based on your device type search for WatchPAT one app either in google play for android devices or APP store for Iphone's Where will I get my PIN for the study?  Your PIN will be provided by your physician's office. It is used for authentication and if you lose/forget your PIN, please reach out to your providers office.  I do not have Internet at home. Can I do WatchPAT one study?  WatchPAT One needs Internet connection throughout the night to be able to transmit the sleep data. You can use your home/local internet or your cellular's data package. However, it is always recommended to use home/local Internet. It is estimated that between 20MB-30MB will be used with each study.However, the application will be looking for 80MB space in the phone to start the study.  What happens if I lose internet or bluetooth connection?  During the internet disconnection, your phone will not be able to transmit the sleep data. All the data, will be stored in your phone. As soon as the internet connection is back on, the phone will being sending the sleep data. During the bluetooth disconnection, WatchPAT one will not be able to to send the sleep data to your phone. Data will be kept in the [facility] one until two devices have bluetooth connection back on. As soon as the connection is back on, WatchPAT one will send the sleep data to the phone.  How long do I need to wear the WatchPAT one?  After you start the study, you should wear the device at least 6 hours.  How far should I keep my phone from the device?  During the night, your phone should be within 15 feet.  What happens if I leave the room for restroom or other reasons?  Leaving the room for any reason will not  cause any problem. As soon as your get back to the room, both devices will reconnect and will continue to send the sleep data. Can I use my phone during the sleep study?  Yes, you can use your phone as usual during the study. But it is recommended to put your watchpat one on when you are ready to go to bed.  How will I get my study results?  A soon as you completed your study, your sleep data will be sent to the provider. They will then share the results with you when they are ready.     Follow-Up: At [facility], you and your health needs are our priority.  As part of our continuing mission to provide you with exceptional heart care, we have created designated Provider Care Teams.  These Care Teams include your primary Cardiologist (physician) and Advanced Practice Providers (APPs -  Physician Assistants and Nurse  Practitioners) who all work together to provide you with the care you need, when you need it.  We recommend signing up for the patient portal called "MyChart".  Sign up information is provided on this After Visit Summary.  MyChart is used to connect with patients for Virtual Visits (Telemedicine).  Patients are able to view lab/test results, encounter notes, upcoming appointments, etc.  Non-urgent messages can be sent to your provider as well.   To learn more about what you can do with MyChart, go to [email or web address].    Your next appointment:   November 29th at 1:30pm at [facility] bring blood pressure cuff and BP log with you Other Instructions Exercise recommendations: The American Heart Association recommends 150 minutes of moderate intensity exercise weekly. Try 30 minutes of moderate intensity exercise 4-5 times per week. This could include walking, jogging, or swimming.  Heart Healthy Diet Recommendations: A low-salt diet is recommended. Meats should be grilled, baked, or boiled. Avoid fried foods. Focus on lean protein sources like  fish or chicken with vegetables and fruits. The American Heart Association is a Microbiologist!  American Heart Association Diet and Lifeystyle Recommendations    Important Information About Sugar

## 2022-10-05 NOTE — Progress Notes (Signed)
Advanced Hypertension Clinic Initial Assessment:    Date:  10/05/2022   ID:  Rita Harrison, DOB 05-02-1981, MRN [id number]  PCP:  Etta Grandchild, MD  Cardiologist:  Chilton Si, MD  Nephrologist:  Referring MD: Etta Grandchild, MD   CC: Hypertension  History of Present Illness:    Rita Harrison is a 41 y.o. female with a hx of hypertension, anxiety, GERD here to establish care in the Advanced Hypertension Clinic.   Saw primary care 09/14/22 and was started on triamterene-HCTZ 37.5mg -25mg  and  Bystolic 10mg  and Amlodipine 5mg  continued. She was given Toradol IM for headache.    Rita Harrison was diagnosed with hypertension 21 years after giving birth to her son.  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  Does not recent stressor is her 47 year old son's father passed away within the last year and she has been supporting her son through this.  He is enrolled in college at [employer] but lives at home.  Blood pressure checked with arm cuff at home but not checked routinely.  she notes previous tobacco use. For exercise she stretches and walks her dog.  No exertional chest discomfort nor dyspnea.  she eats at home and does follow low sodium diet. She notes she snores and wakes up feeling tired with daytime somnolence.   She notes she has had persistent headaches since August.  Unable to see neurology until March. She wonders if amlodipine could be contributory. Having headache in the front of her head and feels tension in her neck. She is taking ibuprofen and tylenol. Taking 2 tylenol 3 times per day. She can feel it "edging on". Taking 800mg  ibuprofen every other day.   Previous antihypertensives: Lisinopril - angioedema Spironolactone - rash  Indapamide - rash  Past Medical History:  Diagnosis Date   Anxiety    Arm DVT (deep venous thromboembolism), chronic (HCC)    RUE   Chicken pox    Hypertension    Sickle cell anemia (HCC)     sickle cell trait    No past surgical history on file.  Current Medications: Current Meds  Medication Sig   ALPRAZolam (XANAX) 0.25 MG tablet Take 1 tablet (0.25 mg total) by mouth 2 (two) times daily as needed for anxiety.   Aluminum Chloride in Alcohol 6.25 % SOLN Apply 1 actuation topically at bedtime as directed   clindamycin (CLEOCIN T) 1 % lotion APPLY A THIN LAYER TO THE AFFECTED AREA(S) 2 TIMES PER DAY AS NEEDED   Fluocinolone Acetonide Body (DERMA-SMOOTHE/FS BODY) 0.01 % OIL Apply topically at bedtime as directed.   ibuprofen (ADVIL) 800 MG tablet Take 1 tablet (800 mg total) by mouth every 8 (eight) hours as needed.   ketoconazole (NIZORAL) 2 % shampoo SHAMPOO SCALP, LET SIT 5 MINUTES, THEN RINSE OUT.   nebivolol (BYSTOLIC) 10 MG tablet Take 1 tablet (10 mg total) by mouth daily.   omeprazole (PRILOSEC) 20 MG capsule Take 1 capsule (20 mg total) by mouth daily.   ondansetron (ZOFRAN-ODT) 4 MG disintegrating tablet Dissolve 1 tablet (4 mg total) by mouth every 8 (eight) hours as needed for nausea or vomiting.   triamterene-hydrochlorothiazide (DYAZIDE) 37.5-25 MG capsule Take 1 each (1 capsule total) by mouth daily.   [DISCONTINUED] amLODipine (NORVASC) 5 MG tablet Take 1 tablet (5 mg total) by mouth daily.     Allergies:   Lisinopril, Indapamide, and Spironolactone   Social History   Socioeconomic History  Marital status: Single    Spouse name: Not on file   Number of children: 1   Years of education: 31   Highest education level: Not on file  Occupational History   Occupation: CNA  Tobacco Use   Smoking status: Former    Packs/day: 0.50    Years: 10.00    Total pack years: 5.00    Types: Cigarettes   Smokeless tobacco: Never  Vaping Use   Vaping Use: Former  Substance and Sexual Activity   Alcohol use: Not Currently    Comment: occasionally   Drug use: Yes    Types: Marijuana    Comment: occasionally   Sexual activity: Not Currently    Partners: Male   Other Topics Concern   Not on file  Social History Narrative   Fun: Travel, family, son's football games    Denies religious beliefs effecting health care   Feels safe at home and denies abuse.    Social Determinants of Health   Financial Resource Strain: Low Risk  (10/05/2022)   Overall Financial Resource Strain (CARDIA)    Difficulty of Paying Living Expenses: Not hard at all  Food Insecurity: No Food Insecurity (10/05/2022)   Hunger Vital Sign    Worried About Running Out of Food in the Last Year: Never true    Ran Out of Food in the Last Year: Never true  Transportation Needs: No Transportation Needs (10/05/2022)   PRAPARE - Administrator, Civil Service (Medical): No    Lack of Transportation (Non-Medical): No  Physical Activity: Insufficiently Active (10/05/2022)   Exercise Vital Sign    Days of Exercise per Week: 7 days    Minutes of Exercise per Session: 10 min  Stress: Stress Concern Present (10/05/2022)   [employer] of Occupational Health - Occupational Stress Questionnaire    Feeling of Stress : To some extent  Social Connections: Not on file     Family History: The patient's family history includes Breast cancer in her maternal grandmother; Kidney cancer in her father; Sickle cell anemia in her father.  ROS:   Please see the history of present illness.    All other systems reviewed and are negative.  EKGs/Labs/Other Studies Reviewed:    EKG:  EKG is ordered today.  The ekg ordered today demonstrates NSR 72 bpm with sinus arrhythmia.  Recent Labs: 12/01/2021: ALT 17 03/02/2022: Hemoglobin 14.1; Magnesium 2.4; Platelets 266.0; TSH 1.04 09/14/2022: BUN 15; Creatinine, Ser 1.03; Potassium 3.6; Sodium 137   Recent Lipid Panel    Component Value Date/Time   CHOL 180 11/09/2020 1634   TRIG 82.0 11/09/2020 1634   HDL 41.30 11/09/2020 1634   CHOLHDL 4 11/09/2020 1634   VLDL 16.4 11/09/2020 1634   LDLCALC 122 (H) 11/09/2020 1634    Physical  Exam:   VS:  BP (!) 152/94 Comment: left arm  Pulse 72   Ht 5' 6.5" (1.689 m)   Wt 205 lb (93 kg)   BMI 32.59 kg/m  , BMI Body mass index is 32.59 kg/m. GENERAL:  Well appearing HEENT: Pupils equal round and reactive, fundi not visualized, oral mucosa unremarkable NECK:  No jugular venous distention, waveform within normal limits, carotid upstroke brisk and symmetric, no bruits, no thyromegaly LYMPHATICS:  No cervical adenopathy LUNGS:  Clear to auscultation bilaterally HEART:  RRR.  PMI not displaced or sustained,S1 and S2 within normal limits, no S3, no S4, no clicks, no rubs, no murmurs ABD:  Flat, positive bowel sounds  normal in frequency in pitch, no bruits, no rebound, no guarding, no midline pulsatile mass, no hepatomegaly, no splenomegaly EXT:  2 plus pulses throughout, no edema, no cyanosis no clubbing SKIN:  No rashes no nodules NEURO:  Cranial nerves II through XII grossly intact, motor grossly intact throughout PSYCH:  Cognitively intact, oriented to person place and time   ASSESSMENT/PLAN:    HTN - Not at goal <130/80. Enrolled in research study today - BP cuff provided.  Continue nebivolol 10 mg daily, triamterene-HCTZ 37.5-25 mg daily.  We will have her hold amlodipine over the weekend as she wonders whether it is contributory to her headaches.  Check in Monday via MyChart message -if headaches resolved we will remain off amlodipine.  If headache not resolved trial resume amlodipine and holding triamterene just HCTZ. Avoid ACE/ARB due to hx of angioedema with Lisinopril. BP symmetrical - no indication for carotid duplex. 02/2022 renin 0.18, aldosterone 13, adlo/PRA ration 72.2. 10/2020 normal renin, aldosterone, ratio. Intolerant of Spironolactone/Indapaminde with rash. 02/2022 normal TSH Labs ordered: catecholamines, metanephrines, cortisol If BP remains difficult to control, consider renal artery duplex.   Snores / Sleep disordered breathing - STOP Bang 4. Snores, wakes  feeling not rested, daytime somnolence. [name] sleep study.   Headaches - has been referred to neurology by PCP. Visit not til March 2024. Encouraged to limit NSAID due to hypertensive effects.   Anxiety - Continue to follow with PCP.   Screening for Secondary Hypertension:     10/05/2022    2:06 PM  Causes  Sleep Apnea Screened     - Comments 09/2022 itamar sleep study  Thyroid Disease Screened     - Comments normal TSH  Pheochromocytoma Screened     - Comments 09/2022 labs ordered  Cushing's Syndrome Screened     - Comments 09/2022 labs ordered  Coarctation of the Aorta N/A     - Comments BP symmetrical  Compliance Screened    Relevant Labs/Studies:    Latest Ref Rng & Units 09/14/2022    4:18 PM 05/17/2022    1:41 PM 03/02/2022   10:25 AM  Basic Labs  Sodium 135 - 145 mEq/L 137  135  139   Potassium 3.5 - 5.1 mEq/L 3.6  3.9  4.1   Creatinine 0.40 - 1.20 mg/dL 1.03  1.00  1.04        Latest Ref Rng & Units 03/02/2022   10:25 AM 11/09/2020    4:34 PM  Thyroid   TSH 0.35 - 5.50 uIU/mL 1.04  1.38        Latest Ref Rng & Units 03/02/2022   10:25 AM 11/09/2020    4:34 PM  Renin/Aldosterone   Aldosterone  ng/dL 13  6               she consents to be monitored in our remote patient monitoring program through [employer].  she will track his blood pressure twice daily and understands that these trends will help [place] to adjust her medications as needed prior to his next appointment.    Disposition:    FU with PharmD in 1 month    Medication Adjustments/Labs and Tests Ordered: Current medicines are reviewed at length with the patient today.  Concerns regarding medicines are outlined above.  Orders Placed This Encounter  Procedures   Cortisol   Catecholamines, fractionated, plasma   Metanephrines, plasma   Cantril's Ladder Assessment   EKG 12-Lead   [name] Sleep Study   No orders of  the defined types were placed in this encounter.    Signed, Alver Sorrow, NP   10/05/2022 2:06 PM    [facility]

## 2022-10-06 ENCOUNTER — Telehealth: Payer: Self-pay | Admitting: *Deleted

## 2022-10-06 NOTE — Telephone Encounter (Signed)
Prior Authorization for [name] sent to [facility] via web portal. Case Number [id number].

## 2022-10-08 ENCOUNTER — Other Ambulatory Visit: Payer: Self-pay | Admitting: Internal Medicine

## 2022-10-08 DIAGNOSIS — I10 Essential (primary) hypertension: Secondary | ICD-10-CM

## 2022-10-08 MED ORDER — TRIAMTERENE-HCTZ 37.5-25 MG PO CAPS
1.0000 | ORAL_CAPSULE | Freq: Every day | ORAL | 0 refills | Status: DC
  Filled 2022-10-08: qty 30, 30d supply, fill #0

## 2022-10-09 ENCOUNTER — Encounter (HOSPITAL_BASED_OUTPATIENT_CLINIC_OR_DEPARTMENT_OTHER): Payer: Self-pay

## 2022-10-09 ENCOUNTER — Other Ambulatory Visit (HOSPITAL_COMMUNITY): Payer: Self-pay

## 2022-10-09 NOTE — Telephone Encounter (Signed)
Follow up with patient as requested

## 2022-10-10 NOTE — Telephone Encounter (Signed)
Headaches not better off Amlodipine - would recommend resume Amlodipine 5mg  daily. If she wishes, could hold Triamterene-HCTZ for 2 days to see if headaches improve and give [place] update Friday. Alternatively, could add AMlodipine to current medications and give [place] an update of BP on Friday. Triamterene-HCTZ does not routinely cause headaches but if she wanted a short trial off of it, would be reasonable.   Would recommend she follow up with her OBGYN regarding her increased frequency of cycle.   Loel Dubonnet, NP

## 2022-10-11 ENCOUNTER — Inpatient Hospital Stay: Payer: 59

## 2022-10-11 DIAGNOSIS — I10 Essential (primary) hypertension: Secondary | ICD-10-CM | POA: Diagnosis not present

## 2022-10-12 ENCOUNTER — Telehealth: Payer: Self-pay | Admitting: *Deleted

## 2022-10-12 NOTE — Telephone Encounter (Signed)
Notified Ernie Hew ok to activate itamar device.

## 2022-10-14 LAB — CORTISOL: Cortisol: 21.8 ug/dL — ABNORMAL HIGH (ref 6.2–19.4)

## 2022-10-16 ENCOUNTER — Ambulatory Visit (INDEPENDENT_AMBULATORY_CARE_PROVIDER_SITE_OTHER): Payer: 59 | Admitting: Internal Medicine

## 2022-10-16 ENCOUNTER — Other Ambulatory Visit (HOSPITAL_COMMUNITY): Payer: Self-pay

## 2022-10-16 ENCOUNTER — Encounter: Payer: Self-pay | Admitting: Internal Medicine

## 2022-10-16 VITALS — BP 136/82 | HR 73 | Temp 98.3°F | Resp 16 | Ht 66.5 in | Wt 203.0 lb

## 2022-10-16 DIAGNOSIS — G43411 Hemiplegic migraine, intractable, with status migrainosus: Secondary | ICD-10-CM

## 2022-10-16 DIAGNOSIS — I10 Essential (primary) hypertension: Secondary | ICD-10-CM | POA: Diagnosis not present

## 2022-10-16 DIAGNOSIS — G4451 Hemicrania continua: Secondary | ICD-10-CM | POA: Insufficient documentation

## 2022-10-16 MED ORDER — NURTEC 75 MG PO TBDP: 1.0000 | ORAL_TABLET | ORAL | 0 refills | Status: DC

## 2022-10-16 MED ORDER — NURTEC 75 MG PO TBDP
1.0000 | ORAL_TABLET | ORAL | 1 refills | Status: DC
  Filled 2022-10-16: qty 46, 90d supply, fill #0

## 2022-10-16 NOTE — Progress Notes (Signed)
Subjective:  Patient ID: Rita Harrison, female    DOB: 1981/09/14  Age: 41 y.o. MRN: [id number]  CC: Headache and Hypertension   HPI Rita Harrison presents for f/up -   She complains of right frontal headaches for at least a month.  The headaches occur up to 3 times a day but never during her sleep.  She gets some symptom relief with a combination of Tylenol and Advil.  She complains of nausea, photophobia, and photophobia.  She describes the headaches as a dull discomfort and fullness.  She denies changes in her vision or hearing, paresthesias, or vomiting.  She has felt depressed and tearful because she is concerned there is something serious causing the headaches.  Her sister has migraines.  Outpatient Medications Prior to Visit  Medication Sig Dispense Refill   ALPRAZolam (XANAX) 0.25 MG tablet Take 1 tablet (0.25 mg total) by mouth 2 (two) times daily as needed for anxiety. 20 tablet 1   Aluminum Chloride in Alcohol 6.25 % SOLN Apply 1 actuation topically at bedtime as directed 60 mL 3   clindamycin (CLEOCIN T) 1 % lotion APPLY A THIN LAYER TO THE AFFECTED AREA(S) 2 TIMES PER DAY AS NEEDED 60 mL 1   Fluocinolone Acetonide Body (DERMA-SMOOTHE/FS BODY) 0.01 % OIL Apply topically at bedtime as directed. 118 mL 1   ibuprofen (ADVIL) 800 MG tablet Take 1 tablet (800 mg total) by mouth every 8 (eight) hours as needed. 30 tablet 0   ketoconazole (NIZORAL) 2 % shampoo SHAMPOO SCALP, LET SIT 5 MINUTES, THEN RINSE OUT. 120 mL 2   nebivolol (BYSTOLIC) 10 MG tablet Take 1 tablet (10 mg total) by mouth daily. 90 tablet 3   omeprazole (PRILOSEC) 20 MG capsule Take 1 capsule (20 mg total) by mouth daily. 30 capsule 1   ondansetron (ZOFRAN-ODT) 4 MG disintegrating tablet Dissolve 1 tablet (4 mg total) by mouth every 8 (eight) hours as needed for nausea or vomiting. 20 tablet 0   triamterene-hydrochlorothiazide (DYAZIDE) 37.5-25 MG capsule Take 1 capsule by mouth daily. 30 capsule 0   No  facility-administered medications prior to visit.    ROS Review of Systems  Constitutional: Negative.   HENT: Negative.    Eyes:  Positive for photophobia. Negative for visual disturbance.  Respiratory:  Negative for cough, chest tightness, shortness of breath and wheezing.   Cardiovascular:  Negative for chest pain, palpitations and leg swelling.  Gastrointestinal:  Positive for nausea. Negative for abdominal pain, constipation, diarrhea and vomiting.  Endocrine: Negative.   Genitourinary: Negative.  Negative for difficulty urinating.  Musculoskeletal: Negative.  Negative for neck pain.  Skin: Negative.  Negative for color change.  Neurological:  Positive for headaches. Negative for dizziness, tremors, seizures, syncope, facial asymmetry, speech difficulty, weakness, light-headedness and numbness.  Hematological:  Negative for adenopathy. Does not bruise/bleed easily.  Psychiatric/Behavioral: Negative.      Objective:  BP 136/82 (BP Location: Left Arm, Patient Position: Sitting, Cuff Size: Large)   Pulse 73   Temp 98.3 F (36.8 C) (Oral)   Resp 16   Ht 5' 6.5" (1.689 m)   Wt 203 lb (92.1 kg)   LMP 10/13/2022 (Exact Date)   SpO2 94%   BMI 32.27 kg/m   BP Readings from Last 3 Encounters:  10/16/22 136/82  10/05/22 (!) 152/94  09/14/22 (!) 160/108    Wt Readings from Last 3 Encounters:  10/16/22 203 lb (92.1 kg)  10/05/22 205 lb (93 kg)  09/14/22 205 lb (  93 kg)    Physical Exam Vitals reviewed.  Constitutional:      General: She is not in acute distress.    Appearance: She is not ill-appearing, toxic-appearing or diaphoretic.  HENT:     Nose: Nose normal.     Mouth/Throat:     Mouth: Mucous membranes are moist.  Eyes:     General: No scleral icterus.    Extraocular Movements: Extraocular movements intact.     Pupils: Pupils are equal, round, and reactive to light.  Cardiovascular:     Rate and Rhythm: Normal rate and regular rhythm.     Heart sounds: No  murmur heard. Pulmonary:     Effort: Pulmonary effort is normal.     Breath sounds: No stridor. No wheezing, rhonchi or rales.  Abdominal:     General: Abdomen is flat.     Palpations: There is no mass.     Tenderness: There is no abdominal tenderness. There is no guarding or rebound.     Hernia: No hernia is present.  Musculoskeletal:        General: Normal range of motion.     Cervical back: Neck supple.     Right lower leg: No edema.     Left lower leg: No edema.  Lymphadenopathy:     Cervical: No cervical adenopathy.  Skin:    General: Skin is warm and dry.  Neurological:     General: No focal deficit present.     Mental Status: She is alert.     Cranial Nerves: Cranial nerves 2-12 are intact.     Sensory: Sensation is intact.     Motor: Motor function is intact.     Coordination: Romberg sign negative. Coordination normal. Finger-Nose-Finger Test normal.     Gait: Gait is intact. Gait and tandem walk normal.     Deep Tendon Reflexes: Reflexes normal. Babinski sign absent on the right side. Babinski sign absent on the left side.     Reflex Scores:      Tricep reflexes are 0 on the right side and 0 on the left side.      Bicep reflexes are 0 on the right side and 0 on the left side.      Brachioradialis reflexes are 0 on the right side and 0 on the left side.      Patellar reflexes are 2+ on the right side and 2+ on the left side.      Achilles reflexes are 1+ on the right side and 1+ on the left side.    Lab Results  Component Value Date   WBC 10.5 03/02/2022   HGB 14.1 03/02/2022   HCT 42.0 03/02/2022   PLT 266.0 03/02/2022   GLUCOSE 95 09/14/2022   CHOL 180 11/09/2020   TRIG 82.0 11/09/2020   HDL 41.30 11/09/2020   LDLCALC 122 (H) 11/09/2020   ALT 17 12/01/2021   AST 15 12/01/2021   NA 137 09/14/2022   K 3.6 09/14/2022   CL 102 09/14/2022   CREATININE 1.03 09/14/2022   BUN 15 09/14/2022   CO2 29 09/14/2022   TSH 1.04 03/02/2022   INR 1.08 04/23/2013    HGBA1C 5.6 02/21/2018    MM DIAG BREAST TOMO BILATERAL  Result Date: 08/01/2022 CLINICAL DATA:  41 year old female presenting for annual bilateral mammogram and 1 year follow-up of a probably benign right breast mass. EXAM: DIGITAL DIAGNOSTIC BILATERAL MAMMOGRAM WITH TOMOSYNTHESIS; ULTRASOUND RIGHT BREAST LIMITED TECHNIQUE: Bilateral digital diagnostic mammography and breast  tomosynthesis was performed.; Targeted ultrasound examination of the right breast was performed COMPARISON:  Previous exam(s). ACR Breast Density Category c: The breast tissue is heterogeneously dense, which may obscure small masses. FINDINGS: A round, circumscribed equal density mass in the central lateral right breast at mid depth has slightly increased in size. This corresponds with a benign simple cyst. Otherwise, no new or suspicious findings in either breast. The remainder of the parenchymal pattern is unchanged. Targeted ultrasound is performed, showing slight interval decrease in size of a minimally complicated cyst at the 9 o'clock position 6 cm from the nipple. Today it measures 4 x 4 x 4 mm (previously 6 x 5 x 5 mm). At the adjacent simple cyst is again identified. IMPRESSION: 1. Stable to slight interval decrease in size of a minimally complicated cyst at the 9 o'clock position. Recommend a final follow-up in 1 year. 2. No mammographic evidence of malignancy on the left. RECOMMENDATION: Bilateral diagnostic mammogram and right breast ultrasound in 1 year. I have discussed the findings and recommendations with the patient. If applicable, a reminder letter will be sent to the patient regarding the next appointment. BI-RADS CATEGORY  3: Probably benign. Electronically Signed   By: Sande Brothers M.D.   On: 08/01/2022 08:33  US BREAST LTD UNI RIGHT INC AXILLA  Result Date: 08/01/2022 CLINICAL DATA:  41 year old female presenting for annual bilateral mammogram and 1 year follow-up of a probably benign right breast mass. EXAM:  DIGITAL DIAGNOSTIC BILATERAL MAMMOGRAM WITH TOMOSYNTHESIS; ULTRASOUND RIGHT BREAST LIMITED TECHNIQUE: Bilateral digital diagnostic mammography and breast tomosynthesis was performed.; Targeted ultrasound examination of the right breast was performed COMPARISON:  Previous exam(s). ACR Breast Density Category c: The breast tissue is heterogeneously dense, which may obscure small masses. FINDINGS: A round, circumscribed equal density mass in the central lateral right breast at mid depth has slightly increased in size. This corresponds with a benign simple cyst. Otherwise, no new or suspicious findings in either breast. The remainder of the parenchymal pattern is unchanged. Targeted ultrasound is performed, showing slight interval decrease in size of a minimally complicated cyst at the 9 o'clock position 6 cm from the nipple. Today it measures 4 x 4 x 4 mm (previously 6 x 5 x 5 mm). At the adjacent simple cyst is again identified. IMPRESSION: 1. Stable to slight interval decrease in size of a minimally complicated cyst at the 9 o'clock position. Recommend a final follow-up in 1 year. 2. No mammographic evidence of malignancy on the left. RECOMMENDATION: Bilateral diagnostic mammogram and right breast ultrasound in 1 year. I have discussed the findings and recommendations with the patient. If applicable, a reminder letter will be sent to the patient regarding the next appointment. BI-RADS CATEGORY  3: Probably benign. Electronically Signed   By: Sande Brothers M.D.   On: 08/01/2022 08:33   Assessment & Plan:   Kiarra was seen today for headache and hypertension.  Diagnoses and all orders for this visit:  Hemicrania continua- She has a new onset headache.  I recommend an MRI of the brain to evaluate for structural lesion/secondary causes for headaches. -     MR Brain Wo Contrast; Future  Intractable hemiplegic migraine with status migrainosus- Will treat with a CGRP antagonist. -     Discontinue: Rimegepant  Sulfate (NURTEC) 75 MG TBDP; Take 1 tablet by mouth every other day. -     Rimegepant Sulfate (NURTEC) 75 MG TBDP; Take 1 tablet by mouth every other day.  Primary hypertension-  Her blood pressure is much better.   I am having Rita Harrison maintain her Fluocinolone Acetonide Body, Aluminum Chloride in Alcohol, ALPRAZolam, ketoconazole, nebivolol, clindamycin, omeprazole, ondansetron, ibuprofen, triamterene-hydrochlorothiazide, and Nurtec.  Meds ordered this encounter  Medications   DISCONTD: Rimegepant Sulfate (NURTEC) 75 MG TBDP    Sig: Take 1 tablet by mouth every other day.    Dispense:  2 tablet    Refill:  0   Rimegepant Sulfate (NURTEC) 75 MG TBDP    Sig: Take 1 tablet by mouth every other day.    Dispense:  46 tablet    Refill:  1     Follow-up: Return in about 3 months (around 01/16/2023).  Sanda Linger, MD

## 2022-10-16 NOTE — Patient Instructions (Signed)
General Headache Without Cause A headache is pain or discomfort you feel around the head or neck area. There are many causes and types of headaches. In some cases, the cause may not be found. Follow these instructions at home: Watch your condition for any changes. Let your doctor know about them. Take these steps to help with your condition: Managing pain     Take over-the-counter and prescription medicines only as told by your doctor. This includes medicines for pain that are taken by mouth or put on the skin. Lie down in a dark, quiet room when you have a headache. If told, put ice on your head and neck area: Put ice in a plastic bag. Place a towel between your skin and the bag. Leave the ice on for 20 minutes, 2-3 times per day. Take off the ice if your skin turns bright red. This is very important. If you cannot feel pain, heat, or cold, you have a greater risk of damage to the area. If told, put heat on the affected area. Use the heat source that your doctor recommends, such as a moist heat pack or a heating pad. Place a towel between your skin and the heat source. Leave the heat on for 20-30 minutes. Take off the heat if your skin turns bright red. This is very important. If you cannot feel pain, heat, or cold, you have a greater risk of getting burned. Keep lights dim if bright lights bother you or make your headaches worse. Eating and drinking Eat meals on a regular schedule. If you drink alcohol: Limit how much you have to: 0-1 drink a day for women who are not pregnant. 0-2 drinks a day for men. Know how much alcohol is in a drink. In the [place] one drink equals one 12 oz bottle of beer (355 mL), one 5 oz glass of wine (148 mL), or one 1 oz glass of hard liquor (44 mL). Stop drinking caffeine, or drink less caffeine. General instructions  Keep a journal to find out if certain things bring on headaches. For example, write down: What you eat and drink. How much sleep you  get. Any change to your diet or medicines. Get a massage or try other ways to relax. Limit stress. Sit up straight. Do not tighten (tense) your muscles. Do not smoke or use any products that contain nicotine or tobacco. If you need help quitting, ask your doctor. Exercise regularly as told by your doctor. Get enough sleep. This often means 7-9 hours of sleep each night. Keep all follow-up visits. This is important. Contact a doctor if: Medicine does not help your symptoms. You have a headache that feels different than the other headaches. You feel like you may vomit (nauseous) or you vomit. You have a fever. Get help right away if: Your headache: Gets very bad quickly. Gets worse after a lot of physical activity. You have any of these symptoms: You continue to vomit. A stiff neck. Trouble seeing. Your eye or ear hurts. Trouble speaking. Weak muscles or you lose muscle control. You lose your balance or have trouble walking. You feel like you will pass out (faint) or you pass out. You are mixed up (confused). You have a seizure. These symptoms may be an emergency. Get help right away. Call your local emergency services (911 in the [place] Do not wait to see if the symptoms will go away. Do not drive yourself to the hospital. Summary A headache is pain or discomfort that   is felt around the head or neck area. There are many causes and types of headaches. In some cases, the cause may not be found. Keep a journal to help find out what causes your headaches. Watch your condition for any changes. Let your doctor know about them. Contact a doctor if you have a headache that is different from usual, or if medicine does not help your headache. Get help right away if your headache gets very bad, you throw up, you have trouble seeing, you lose your balance, or you have a seizure. This information is not intended to replace advice given to you by your health care provider. Make sure you  discuss any questions you have with your health care provider. Document Revised: 05/04/2021 Document Reviewed: 05/04/2021 Elsevier Patient Education  2023 [employer]  

## 2022-10-17 ENCOUNTER — Encounter (HOSPITAL_BASED_OUTPATIENT_CLINIC_OR_DEPARTMENT_OTHER): Payer: Self-pay

## 2022-10-18 ENCOUNTER — Other Ambulatory Visit: Payer: Self-pay | Admitting: Internal Medicine

## 2022-10-18 ENCOUNTER — Other Ambulatory Visit (HOSPITAL_COMMUNITY): Payer: Self-pay

## 2022-10-18 DIAGNOSIS — G44209 Tension-type headache, unspecified, not intractable: Secondary | ICD-10-CM

## 2022-10-18 MED ORDER — IBUPROFEN 800 MG PO TABS
800.0000 mg | ORAL_TABLET | Freq: Three times a day (TID) | ORAL | 0 refills | Status: DC | PRN
  Filled 2022-10-18: qty 30, 10d supply, fill #0

## 2022-10-19 ENCOUNTER — Other Ambulatory Visit (HOSPITAL_COMMUNITY): Payer: Self-pay

## 2022-10-23 ENCOUNTER — Telehealth: Payer: Self-pay | Admitting: Internal Medicine

## 2022-10-23 ENCOUNTER — Ambulatory Visit
Admission: RE | Admit: 2022-10-23 | Discharge: 2022-10-23 | Disposition: A | Discharge status: Home / Self Care | Payer: 59 | Source: Ambulatory Visit | Attending: Internal Medicine | Admitting: Internal Medicine

## 2022-10-23 ENCOUNTER — Other Ambulatory Visit (HOSPITAL_COMMUNITY): Payer: Self-pay

## 2022-10-23 DIAGNOSIS — R519 Headache, unspecified: Secondary | ICD-10-CM | POA: Diagnosis not present

## 2022-10-23 DIAGNOSIS — G4451 Hemicrania continua: Secondary | ICD-10-CM

## 2022-10-23 NOTE — Telephone Encounter (Signed)
Rita Harrison came in stating Dr. Ronnald Ramp gave her samples of Nurtec when she was last here and she was wondering if she can get some more samples because the pharmacy is on back order.

## 2022-10-24 ENCOUNTER — Encounter: Payer: Self-pay | Admitting: Internal Medicine

## 2022-10-31 ENCOUNTER — Telehealth: Payer: Self-pay | Admitting: Internal Medicine

## 2022-10-31 NOTE — Telephone Encounter (Signed)
Patient had received a referral to a neurologist but she found one that can see her sooner.  Please send a referral to [facility] -

## 2022-11-02 ENCOUNTER — Ambulatory Visit (INDEPENDENT_AMBULATORY_CARE_PROVIDER_SITE_OTHER): Payer: 59 | Admitting: Neurology

## 2022-11-02 ENCOUNTER — Other Ambulatory Visit (HOSPITAL_COMMUNITY): Payer: Self-pay

## 2022-11-02 ENCOUNTER — Encounter: Payer: Self-pay | Admitting: Neurology

## 2022-11-02 VITALS — BP 149/94 | HR 82 | Resp 18 | Ht 66.5 in | Wt 198.0 lb

## 2022-11-02 DIAGNOSIS — M542 Cervicalgia: Secondary | ICD-10-CM

## 2022-11-02 DIAGNOSIS — G4440 Drug-induced headache, not elsewhere classified, not intractable: Secondary | ICD-10-CM | POA: Diagnosis not present

## 2022-11-02 DIAGNOSIS — G43709 Chronic migraine without aura, not intractable, without status migrainosus: Secondary | ICD-10-CM

## 2022-11-02 MED ORDER — CYCLOBENZAPRINE HCL 10 MG PO TABS
10.0000 mg | ORAL_TABLET | Freq: Three times a day (TID) | ORAL | 5 refills | Status: DC | PRN
  Filled 2022-11-02: qty 90, 30d supply, fill #0

## 2022-11-02 MED ORDER — RIZATRIPTAN BENZOATE 10 MG PO TABS
10.0000 mg | ORAL_TABLET | ORAL | 5 refills | Status: DC | PRN
Start: 2022-11-02 — End: 2022-11-23
  Filled 2022-11-02: qty 10, 30d supply, fill #0

## 2022-11-02 MED ORDER — TOPIRAMATE 25 MG PO TABS
25.0000 mg | ORAL_TABLET | Freq: Every day | ORAL | 5 refills | Status: DC
Start: 2022-11-02 — End: 2022-11-23
  Filled 2022-11-02: qty 30, 30d supply, fill #0

## 2022-11-02 NOTE — Patient Instructions (Signed)
  Start topiramate 25mg  at bedtime.  Contact in 4 weeks with update and we can increase dose if needed. STOP TYLENOL AND IBUPROFEN.  Take rizatriptan 10mg  at earliest onset of headache.  May repeat dose once in 2 hours if needed.  Maximum 2 tablets in 24 hours.  Limit use to no more than 2 days out of week. RULE OF THUMB:  Limit use of pain relievers to no more than 2 days out of the week.  These medications include acetaminophen, NSAIDs (ibuprofen/Advil/Motrin, naproxen/Aleve, triptans (Imitrex/sumatriptan), Excedrin, and narcotics.  This will help reduce risk of rebound headaches. For neck stiffness, cyclobenzaprine 10mg  up to three times daily as needed.  Caution for drowsiness. Be aware of common food triggers:  - Caffeine:  coffee, black tea, cola, Mt. Dew  - Chocolate  - Dairy:  aged cheeses (brie, blue, cheddar, gouda, [place], provolone, [place], Swiss, etc), chocolate milk, buttermilk, sour cream, limit eggs and yogurt  - Nuts, peanut butter  - Alcohol  - Cereals/grains:  FRESH breads (fresh bagels, sourdough, doughnuts), yeast productions  - Processed/canned/aged/cured meats (pre-packaged deli meats, hotdogs)  - MSG/glutamate:  soy sauce, flavor enhancer, pickled/preserved/marinated foods  - Sweeteners:  aspartame (Equal, Nutrasweet).  Sugar and Splenda are okay  - Vegetables:  legumes (lima beans, lentils, snow peas, fava beans, pinto peans, peas, garbanzo beans), sauerkraut, onions, olives, pickles  - Fruit:  avocados, bananas, citrus fruit (orange, lemon, grapefruit), mango  - Other:  Frozen meals, macaroni and cheese Routine exercise Stay adequately hydrated (aim for 64 oz water daily) Keep headache diary Maintain proper stress management Maintain proper sleep hygiene Do not skip meals Consider supplements:  magnesium citrate 400mg  daily, riboflavin 400mg  daily, coenzyme Q10 100mg  three times daily. Follow up 4 to 5 months.

## 2022-11-02 NOTE — Progress Notes (Signed)
NEUROLOGY CONSULTATION NOTE  Rita Harrison MRN: [id number] DOB: 1981/11/15  Referring provider: Sanda Linger, MD Primary care provider: Sanda Linger, MD  Reason for consult:  headache  Assessment/Plan:   1  Chronic migraine without aura, without status migrainosus, not intractable complicated by medication overuse.  The fact that she knows the exact date of onset also suggests new daily persistent headaches - she does have headache free periods which may be due to short-term effects of the analgesics. 2  Myofascial cervical pain  Migraine prevention:  start topiramate 25mg  at bedtime.  We can increase dose in 4 weeks if needed.  Side effects discussed Migraine rescue:  Stop ibuprofen and Tylenol.  Instead try rizatriptan 10mg  - limit to no more than 2 days out of week.  Zofran for nausea Rule of thumb:  Limit use of pain relievers to no more than 2 days out of week to prevent risk of rebound or medication-overuse headache. Cyclobenzaprine 10mg  TID PRN for neck stiffness - caution for drowsiness Keep headache diary Follow up 4 to 5 months.    Subjective:  Rita Harrison is a 41 year old right-handed female with HTN, sickle cell trait and history of right upper extremity DVT who presents for headaches.  History supplemented by referring provider's note.  Onset:  August 02, 2022 Location:  right frontal Quality:  pressure Intensity:  mild.  Gradual onset Aura:  absent Prodrome:  absent Associated symptoms:  Nausea, photophobia, phonophobia.  She denies associated vomiting, visual disturbance, autonomic symptoms or unilateral numbness or weakness. Duration:  Treats it immediately with ibuprofen or Tylenol Extra-strength and will last 15-30 minutes Frequency:  3 times daily (8:30 AM, around noon, 7 PM) daily.  Sometimes may wake her up in middle of night for which she takes another Tylenol or ibuprofen Frequency of abortive medication: 3 to 4 times daily Triggers:   unknown Relieving factors:  Tylenol and ibuprofen Activity:  not aggravating  She saw her gynecologist.  Blood work showed she is almost perimenopausal.  Reports hot flashes at night.  Blood pressure has been elevated but unsure if cause or effect of headache.   MRI of brain without contrast on 10/23/2022 personally reviewed revealed minimal scattered T2 FLAIR hyperintense foci within the cerebral white matter as well as prominent posterior pituitary bright spot vs pars intermedia cyst but nothing concerning that would explain headaches.    Reports similar headaches sometimes associated with her cycle, but infrequent.  Past NSAIDS/analgesics:  ketorolac, meloxicam, BC powder Past abortive triptans:  none Past abortive ergotamine:  none Past muscle relaxants:  methocarbamol Past anti-emetic:  none Past antihypertensive medications:  lisinopril Past antidepressant medications:  none Past anticonvulsant medications:  none Past anti-CGRP:  Nurtec (made migraine worse) Past vitamins/Herbal/Supplements:  none Past antihistamines/decongestants:  diphenhydramine Other past therapies:  none  Current NSAIDS/analgesics:  Tylenol, ibuprofen Current triptans:  none Current ergotamine:  none Current anti-emetic:  ondansetron ODT 4mg  Current muscle relaxants:  none Current Antihypertensive medications:  nebivolol, triamterene-HCTZ Current Antidepressant medications:  none Current Anticonvulsant medications:  none Current anti-CGRP:  none Current Vitamins/Herbal/Supplements:  none Current Antihistamines/Decongestants:  none Other therapy:  none Birth control:  none Other medications:  Alprazolam (anxiety)   Caffeine:  no coffee, soda Diet:  Drinks a lot of water.  Stopped soda.  Increased fruits and vegetables intake.   Exercise:  goes to gym most days Depression:  some; Anxiety:  yes.  Her son's father and her grandmother passed away last year.  Her friend passed away 2 years ago.   Other  pain:  no Sleep hygiene:  good until she started having headaches.  Headaches may wake her up.  Also has hot flashes at night  Family history of headache:  Sister (migraines).  No family history of cerebral aneurysms.        PAST MEDICAL HISTORY: Past Medical History:  Diagnosis Date   Anxiety    Arm DVT (deep venous thromboembolism), chronic (HCC)    RUE   Chicken pox    Hypertension    Sickle cell anemia (HCC)    sickle cell trait    PAST SURGICAL HISTORY: No past surgical history on file.  MEDICATIONS: Current Outpatient Medications on File Prior to Visit  Medication Sig Dispense Refill   ALPRAZolam (XANAX) 0.25 MG tablet Take 1 tablet (0.25 mg total) by mouth 2 (two) times daily as needed for anxiety. 20 tablet 1   Aluminum Chloride in Alcohol 6.25 % SOLN Apply 1 actuation topically at bedtime as directed 60 mL 3   clindamycin (CLEOCIN T) 1 % lotion APPLY A THIN LAYER TO THE AFFECTED AREA(S) 2 TIMES PER DAY AS NEEDED 60 mL 1   Fluocinolone Acetonide Body (DERMA-SMOOTHE/FS BODY) 0.01 % OIL Apply topically at bedtime as directed. 118 mL 1   ibuprofen (ADVIL) 800 MG tablet Take 1 tablet (800 mg total) by mouth every 8 (eight) hours as needed. 30 tablet 0   ketoconazole (NIZORAL) 2 % shampoo SHAMPOO SCALP, LET SIT 5 MINUTES, THEN RINSE OUT. 120 mL 2   nebivolol (BYSTOLIC) 10 MG tablet Take 1 tablet (10 mg total) by mouth daily. 90 tablet 3   omeprazole (PRILOSEC) 20 MG capsule Take 1 capsule (20 mg total) by mouth daily. 30 capsule 1   ondansetron (ZOFRAN-ODT) 4 MG disintegrating tablet Dissolve 1 tablet (4 mg total) by mouth every 8 (eight) hours as needed for nausea or vomiting. 20 tablet 0   Rimegepant Sulfate (NURTEC) 75 MG TBDP Take 1 tablet by mouth every other day. 46 tablet 1   triamterene-hydrochlorothiazide (DYAZIDE) 37.5-25 MG capsule Take 1 capsule by mouth daily. 30 capsule 0   No current facility-administered medications on file prior to visit.     ALLERGIES: Allergies  Allergen Reactions   Lisinopril Shortness Of Breath and Itching    angioedema   Indapamide Rash   Spironolactone Other (See Comments)    itching    FAMILY HISTORY: Family History  Problem Relation Age of Onset   Sickle cell anemia Father    Kidney cancer Father    Migraines Sister    Breast cancer Maternal Grandmother     Objective:  Blood pressure (!) 149/94, pulse 82, resp. rate 18, height 5' 6.5" (1.689 m), weight 198 lb (89.8 kg), last menstrual period 10/13/2022, SpO2 98 %. General: No acute distress.  Patient appears well-groomed.   Head:  Normocephalic/atraumatic Eyes:  fundi examined but not visualized Neck: supple, mild paraspinal tenderness, full range of motion Back: No paraspinal tenderness Heart: regular rate and rhythm Lungs: Clear to auscultation bilaterally. Vascular: No carotid bruits. Neurological Exam: Mental status: alert and oriented to person, place, and time, speech fluent and not dysarthric, language intact. Cranial nerves: CN I: not tested CN II: pupils equal, round and reactive to light, visual fields intact CN III, IV, VI:  full range of motion, no nystagmus, no ptosis CN V: facial sensation intact. CN VII: upper and lower face symmetric CN VIII: hearing intact CN IX, X: gag intact,  uvula midline CN XI: sternocleidomastoid and trapezius muscles intact CN XII: tongue midline Bulk & Tone: normal, no fasciculations. Motor:  muscle strength 5/5 throughout Sensation:  Pinprick, temperature and vibratory sensation intact. Deep Tendon Reflexes:  2+ throughout,  toes downgoing.   Finger to nose testing:  Without dysmetria.   Heel to shin:  Without dysmetria.   Gait:  Normal station and stride.  Romberg negative.    Thank you for allowing me to take part in the care of this patient.  Shon Millet, DO  CC: Sanda Linger, MD

## 2022-11-03 ENCOUNTER — Encounter (HOSPITAL_BASED_OUTPATIENT_CLINIC_OR_DEPARTMENT_OTHER): Payer: Self-pay

## 2022-11-12 ENCOUNTER — Other Ambulatory Visit: Payer: Self-pay | Admitting: Internal Medicine

## 2022-11-12 DIAGNOSIS — I10 Essential (primary) hypertension: Secondary | ICD-10-CM

## 2022-11-13 ENCOUNTER — Other Ambulatory Visit (HOSPITAL_COMMUNITY): Payer: Self-pay

## 2022-11-13 ENCOUNTER — Other Ambulatory Visit: Payer: Self-pay | Admitting: Internal Medicine

## 2022-11-13 DIAGNOSIS — F411 Generalized anxiety disorder: Secondary | ICD-10-CM

## 2022-11-13 DIAGNOSIS — I10 Essential (primary) hypertension: Secondary | ICD-10-CM

## 2022-11-13 MED ORDER — ALPRAZOLAM 0.25 MG PO TABS
0.2500 mg | ORAL_TABLET | Freq: Two times a day (BID) | ORAL | 3 refills | Status: DC | PRN
  Filled 2022-11-13: qty 20, 10d supply, fill #0
  Filled 2023-01-29 – 2023-04-03 (×3): qty 20, 10d supply, fill #1

## 2022-11-13 MED ORDER — TRIAMTERENE-HCTZ 37.5-25 MG PO CAPS
1.0000 | ORAL_CAPSULE | Freq: Every day | ORAL | 0 refills | Status: DC
  Filled 2022-11-13: qty 90, 90d supply, fill #0

## 2022-11-14 ENCOUNTER — Other Ambulatory Visit (HOSPITAL_COMMUNITY): Payer: Self-pay

## 2022-11-14 ENCOUNTER — Encounter: Payer: Self-pay | Admitting: Neurology

## 2022-11-15 ENCOUNTER — Ambulatory Visit: Payer: 59

## 2022-11-16 NOTE — Telephone Encounter (Signed)
Taken care of

## 2022-11-22 ENCOUNTER — Inpatient Hospital Stay: Payer: 59 | Attending: Hematology and Oncology

## 2022-11-22 ENCOUNTER — Other Ambulatory Visit: Payer: Self-pay | Admitting: Adult Health

## 2022-11-22 ENCOUNTER — Encounter: Payer: Self-pay | Admitting: Internal Medicine

## 2022-11-22 VITALS — BP 129/81 | HR 63 | Resp 16

## 2022-11-22 DIAGNOSIS — G43411 Hemiplegic migraine, intractable, with status migrainosus: Secondary | ICD-10-CM

## 2022-11-22 DIAGNOSIS — M79603 Pain in arm, unspecified: Secondary | ICD-10-CM | POA: Insufficient documentation

## 2022-11-22 MED ORDER — SODIUM CHLORIDE 0.9 % IV SOLN: Freq: Once | INTRAVENOUS | Status: AC

## 2022-11-22 MED ORDER — SODIUM CHLORIDE 0.9 % IV SOLN: INTRAVENOUS | Status: DC

## 2022-11-22 NOTE — Patient Instructions (Signed)

## 2022-11-22 NOTE — Progress Notes (Signed)
IV saline rate was changed from 999 ml/hr to 500 ml/hr due to arm pain from cold fluids. IV assessed and site was clean, dry and intact with blood return.

## 2022-11-22 NOTE — Progress Notes (Signed)
Orders placed for one liter normal saline for dizziness and migraine for patient after review with Serena Croissant, MD  Lillard Anes, NP

## 2022-11-23 ENCOUNTER — Other Ambulatory Visit: Payer: Self-pay

## 2022-11-23 ENCOUNTER — Other Ambulatory Visit (HOSPITAL_COMMUNITY): Payer: Self-pay

## 2022-11-23 MED ORDER — TOPIRAMATE 50 MG PO TABS
50.0000 mg | ORAL_TABLET | Freq: Every day | ORAL | 5 refills | Status: DC
  Filled 2022-11-23: qty 30, 30d supply, fill #0

## 2022-11-23 MED ORDER — SUMATRIPTAN SUCCINATE 100 MG PO TABS
100.0000 mg | ORAL_TABLET | Freq: Once | ORAL | 2 refills | Status: DC | PRN
  Filled 2022-11-23: qty 9, 30d supply, fill #0

## 2022-11-23 NOTE — Progress Notes (Signed)
Per Dr.Jaffe, 1. We will increase topiramate from 25mg  to 50mg  at bedtime.  If no improvement in 4 weeks, contact and we can continue increasing dose. 2. Stop rizatriptan.  We will try sumatriptan (Imitrex) instead.  Please send the following prescriptions to her pharmacy: 1. Topiramate 50mg  tablet - take 1 tablet at bedtime.  Quantity 30, refills 5 2  Sumatriptan 100mg  tablet.  Take 1 tablet as needed.  May repeat after 2 hours.  Maximum 2 tablets in 24 hours.  Quantity - the default quantity.  Refills 5.    New script sent in.

## 2022-11-24 ENCOUNTER — Other Ambulatory Visit (HOSPITAL_COMMUNITY): Payer: Self-pay

## 2022-11-26 ENCOUNTER — Encounter (HOSPITAL_BASED_OUTPATIENT_CLINIC_OR_DEPARTMENT_OTHER): Payer: Self-pay | Admitting: Emergency Medicine

## 2022-11-26 ENCOUNTER — Emergency Department (HOSPITAL_BASED_OUTPATIENT_CLINIC_OR_DEPARTMENT_OTHER)
Admission: EM | Admit: 2022-11-26 | Discharge: 2022-11-26 | Disposition: A | Discharge status: Home / Self Care | Payer: 59 | Attending: Emergency Medicine | Admitting: Emergency Medicine

## 2022-11-26 ENCOUNTER — Other Ambulatory Visit: Payer: Self-pay

## 2022-11-26 DIAGNOSIS — G43009 Migraine without aura, not intractable, without status migrainosus: Secondary | ICD-10-CM | POA: Diagnosis not present

## 2022-11-26 DIAGNOSIS — R519 Headache, unspecified: Secondary | ICD-10-CM | POA: Diagnosis present

## 2022-11-26 MED ORDER — PROCHLORPERAZINE EDISYLATE 10 MG/2ML IJ SOLN
10.0000 mg | Freq: Once | INTRAMUSCULAR | Status: AC
Start: 2022-11-26 — End: 2022-11-26
  Administered 2022-11-26: 10 mg via INTRAMUSCULAR
  Filled 2022-11-26: qty 2

## 2022-11-26 MED ORDER — DIPHENHYDRAMINE HCL 25 MG PO CAPS
25.0000 mg | ORAL_CAPSULE | Freq: Once | ORAL | Status: AC
  Administered 2022-11-26: 25 mg via ORAL
  Filled 2022-11-26: qty 1

## 2022-11-26 MED ORDER — DEXAMETHASONE SODIUM PHOSPHATE 10 MG/ML IJ SOLN
10.0000 mg | Freq: Once | INTRAMUSCULAR | Status: AC
  Administered 2022-11-26: 10 mg via INTRAMUSCULAR
  Filled 2022-11-26: qty 1

## 2022-11-26 NOTE — ED Provider Notes (Signed)
[facility] EMERGENCY DEPARTMENT Provider Note   CSN: [id number] Arrival date & time: 11/26/22  1039     History  Chief Complaint  Patient presents with   Headache    Rita Harrison is a 41 y.o. female.  Patient here with migraine headache.  History of the same.  Follows with neurology.  Recently increased her daily dose of Topamax.  She has had extensive workup including recent MRI that was unremarkable.  She has been working with neurology to get her headaches under better control.  She has been having daily headaches.  But with some improvement.  Slightly worse today.  Does take sumatriptan but only takes it at night because it makes her sleepy.  Denies any weakness, numbness, vision changes.  No nausea vomiting or diarrhea.  The history is provided by the patient.       Home Medications Prior to Admission medications   Medication Sig Start Date End Date Taking? Authorizing Provider  ALPRAZolam (XANAX) 0.25 MG tablet Take 1 tablet (0.25 mg total) by mouth 2 (two) times daily as needed for anxiety. 11/13/22   Etta Grandchild, MD  Aluminum Chloride in Alcohol 6.25 % SOLN Apply 1 actuation topically at bedtime as directed 03/06/22   Etta Grandchild, MD  clindamycin (CLEOCIN T) 1 % lotion APPLY A THIN LAYER TO THE AFFECTED AREA(S) 2 TIMES PER DAY AS NEEDED 07/14/22     cyclobenzaprine (FLEXERIL) 10 MG tablet Take 1 tablet (10 mg total) by mouth 3 (three) times daily as needed for muscle spasms. 11/02/22   Drema Dallas, DO  Fluocinolone Acetonide Body (DERMA-SMOOTHE/FS BODY) 0.01 % OIL Apply topically at bedtime as directed. 03/03/22   Etta Grandchild, MD  ibuprofen (ADVIL) 800 MG tablet Take 1 tablet (800 mg total) by mouth every 8 (eight) hours as needed. 10/18/22   Etta Grandchild, MD  ketoconazole (NIZORAL) 2 % shampoo SHAMPOO SCALP, LET SIT 5 MINUTES, THEN RINSE OUT. 05/19/22 05/19/23  Etta Grandchild, MD  nebivolol (BYSTOLIC) 10 MG tablet Take 1 tablet (10 mg total) by  mouth daily. 07/14/22   Pincus Sanes, MD  omeprazole (PRILOSEC) 20 MG capsule Take 1 capsule (20 mg total) by mouth daily. 09/08/22   Henson, Vickie L, NP-C  Rimegepant Sulfate (NURTEC) 75 MG TBDP Take 1 tablet by mouth every other day. 10/16/22   Etta Grandchild, MD  SUMAtriptan (IMITREX) 100 MG tablet Take 1 tablet (100 mg total) by mouth once as needed for up to 1 dose for migraine. May repeat in 2 hours if headache persists or recurs. 11/23/22   Drema Dallas, DO  topiramate (TOPAMAX) 50 MG tablet Take 1 tablet (50 mg total) by mouth at bedtime. 11/23/22   Drema Dallas, DO  triamterene-hydrochlorothiazide (DYAZIDE) 37.5-25 MG capsule Take 1 capsule by mouth daily. 11/13/22   Etta Grandchild, MD      Allergies    Lisinopril, Indapamide, and Spironolactone    Review of Systems   Review of Systems  Physical Exam Updated Vital Signs  ED Triage Vitals  Enc Vitals Group     BP 11/26/22 1113 (!) 142/101     Pulse Rate 11/26/22 1113 77     Resp 11/26/22 1113 16     Temp 11/26/22 1113 98.2 F (36.8 C)     Temp Source 11/26/22 1113 Oral     SpO2 11/26/22 1113 100 %     Weight 11/26/22 1113 195 lb (88.5 kg)  Height 11/26/22 1113 5' 6.5" (1.689 m)     Head Circumference --      Peak Flow --      Pain Score 11/26/22 1113 10     Pain Loc --      Pain Edu? --      Excl. in [place]? --     Physical Exam Vitals and nursing note reviewed.  Constitutional:      General: She is not in acute distress.    Appearance: She is well-developed.  HENT:     Head: Normocephalic and atraumatic.  Eyes:     Conjunctiva/sclera: Conjunctivae normal.  Cardiovascular:     Rate and Rhythm: Normal rate and regular rhythm.     Heart sounds: No murmur heard. Pulmonary:     Effort: Pulmonary effort is normal. No respiratory distress.     Breath sounds: Normal breath sounds.  Abdominal:     Palpations: Abdomen is soft.     Tenderness: There is no abdominal tenderness.  Musculoskeletal:        General:  No swelling.     Cervical back: Neck supple.  Skin:    General: Skin is warm and dry.     Capillary Refill: Capillary refill takes less than 2 seconds.  Neurological:     Mental Status: She is alert.     Comments: 5+ out of 5 strength throughout, normal sensation, no drift, normal finger-nose-finger, normal speech, normal gait  Psychiatric:        Mood and Affect: Mood normal.     ED Results / Procedures / Treatments   Labs (all labs ordered are listed, but only abnormal results are displayed) Labs Reviewed - No data to display  EKG None  Radiology No results found.  Procedures Procedures    Medications Ordered in ED Medications  prochlorperazine (COMPAZINE) injection 10 mg (has no administration in time range)  dexamethasone (DECADRON) injection 10 mg (has no administration in time range)  diphenhydrAMINE (BENADRYL) capsule 25 mg (has no administration in time range)    ED Course/ Medical Decision Making/ A&P                           Medical Decision Making Risk Prescription drug management.   Rita Harrison is here with migraine.  Normal vitals.  No fever.  Slightly worse migraine than she had recently.  Overall she is well-appearing.  Reviewed her recent neurology notes and workup.  She had unremarkable MRI recently.  They just increase her Topamax daily.  She takes sumatriptan at nighttime because it makes her sleepy so she tries to avoid it during the day.  Overall shared decision was made to give her IM Compazine, Decadron and p.o. Benadryl to help with her symptoms.  Overall she is well-appearing.  Will have her go home.  She understands return precautions.  Neurologically she is intact.  Have no concern for other acute process.  This chart was dictated using voice recognition software.  Despite best efforts to proofread,  errors can occur which can change the documentation meaning.         Final Clinical Impression(s) / ED Diagnoses Final diagnoses:   Migraine without aura and without status migrainosus, not intractable    Rx / DC Orders ED Discharge Orders     None         Lennice Sites, DO 11/26/22 1212

## 2022-11-26 NOTE — ED Triage Notes (Signed)
Pt reports HAs daily since August; she has tried migraine meds such as Topomax at home w/o relief

## 2022-11-29 ENCOUNTER — Encounter: Payer: Self-pay | Admitting: Internal Medicine

## 2022-11-29 ENCOUNTER — Other Ambulatory Visit (HOSPITAL_COMMUNITY): Payer: Self-pay

## 2022-11-29 ENCOUNTER — Ambulatory Visit: Payer: 59 | Admitting: Internal Medicine

## 2022-11-29 VITALS — BP 142/88 | HR 77 | Temp 98.1°F | Resp 16 | Ht 66.5 in | Wt 198.0 lb

## 2022-11-29 DIAGNOSIS — I10 Essential (primary) hypertension: Secondary | ICD-10-CM

## 2022-11-29 DIAGNOSIS — E876 Hypokalemia: Secondary | ICD-10-CM | POA: Insufficient documentation

## 2022-11-29 LAB — BASIC METABOLIC PANEL
BUN: 20 mg/dL (ref 6–23)
CO2: 30 mEq/L (ref 19–32)
Calcium: 9.8 mg/dL (ref 8.4–10.5)
Chloride: 100 mEq/L (ref 96–112)
Creatinine, Ser: 1.08 mg/dL (ref 0.40–1.20)
GFR: 62.81 mL/min (ref >60.00)
Glucose, Bld: 74 mg/dL (ref 70–99)
Potassium: 3.3 mEq/L — ABNORMAL LOW (ref 3.5–5.1)
Sodium: 137 mEq/L (ref 135–145)

## 2022-11-29 LAB — CBC WITH DIFFERENTIAL/PLATELET
Basophils Absolute: 0 10*3/uL (ref 0.0–0.1)
Basophils Relative: 0.4 % (ref 0.0–3.0)
Eosinophils Absolute: 0.1 10*3/uL (ref 0.0–0.7)
Eosinophils Relative: 1 % (ref 0.0–5.0)
HCT: 39.2 % (ref 36.0–46.0)
Hemoglobin: 13.5 g/dL (ref 12.0–15.0)
Lymphocytes Relative: 30.9 % (ref 12.0–46.0)
Lymphs Abs: 3.3 10*3/uL (ref 0.7–4.0)
MCHC: 34.4 g/dL (ref 30.0–36.0)
MCV: 82 fl (ref 78.0–100.0)
Monocytes Absolute: 0.7 10*3/uL (ref 0.1–1.0)
Monocytes Relative: 6.2 % (ref 3.0–12.0)
Neutro Abs: 6.6 10*3/uL (ref 1.4–7.7)
Neutrophils Relative %: 61.5 % (ref 43.0–77.0)
Platelets: 291 10*3/uL (ref 150.0–400.0)
RBC: 4.79 Mil/uL (ref 3.87–5.11)
RDW: 13.8 % (ref 11.5–15.5)
WBC: 10.7 10*3/uL — ABNORMAL HIGH (ref 4.0–10.5)

## 2022-11-29 LAB — HEPATIC FUNCTION PANEL
ALT: 25 U/L (ref 0–35)
AST: 19 U/L (ref 0–37)
Albumin: 4.4 g/dL (ref 3.5–5.2)
Alkaline Phosphatase: 59 U/L (ref 39–117)
Bilirubin, Direct: 0.1 mg/dL (ref 0.0–0.3)
Total Bilirubin: 0.3 mg/dL (ref 0.2–1.2)
Total Protein: 7.2 g/dL (ref 6.0–8.3)

## 2022-11-29 MED ORDER — POTASSIUM CHLORIDE CRYS ER 10 MEQ PO TBCR
10.0000 meq | EXTENDED_RELEASE_TABLET | Freq: Two times a day (BID) | ORAL | 1 refills | Status: DC
  Filled 2022-11-29: qty 180, 90d supply, fill #0
  Filled 2023-04-26: qty 180, 90d supply, fill #1
  Filled 2023-04-30: qty 60, 30d supply, fill #1

## 2022-11-29 NOTE — Patient Instructions (Signed)
Hypertension, Adult High blood pressure (hypertension) is when the force of blood pumping through the arteries is too strong. The arteries are the blood vessels that carry blood from the heart throughout the body. Hypertension forces the heart to work harder to pump blood and may cause arteries to become narrow or stiff. Untreated or uncontrolled hypertension can lead to a heart attack, heart failure, a stroke, kidney disease, and other problems. A blood pressure reading consists of a higher number over a lower number. Ideally, your blood pressure should be below 120/80. The first ("top") number is called the systolic pressure. It is a measure of the pressure in your arteries as your heart beats. The second ("bottom") number is called the diastolic pressure. It is a measure of the pressure in your arteries as the heart relaxes. What are the causes? The exact cause of this condition is not known. There are some conditions that result in high blood pressure. What increases the risk? Certain factors may make you more likely to develop high blood pressure. Some of these risk factors are under your control, including: Smoking. Not getting enough exercise or physical activity. Being overweight. Having too much fat, sugar, calories, or salt (sodium) in your diet. Drinking too much alcohol. Other risk factors include: Having a personal history of heart disease, diabetes, high cholesterol, or kidney disease. Stress. Having a family history of high blood pressure and high cholesterol. Having obstructive sleep apnea. Age. The risk increases with age. What are the signs or symptoms? High blood pressure may not cause symptoms. Very high blood pressure (hypertensive crisis) may cause: Headache. Fast or irregular heartbeats (palpitations). Shortness of breath. Nosebleed. Nausea and vomiting. Vision changes. Severe chest pain, dizziness, and seizures. How is this diagnosed? This condition is diagnosed by  measuring your blood pressure while you are seated, with your arm resting on a flat surface, your legs uncrossed, and your feet flat on the floor. The cuff of the blood pressure monitor will be placed directly against the skin of your upper arm at the level of your heart. Blood pressure should be measured at least twice using the same arm. Certain conditions can cause a difference in blood pressure between your right and left arms. If you have a high blood pressure reading during one visit or you have normal blood pressure with other risk factors, you may be asked to: Return on a different day to have your blood pressure checked again. Monitor your blood pressure at home for 1 week or longer. If you are diagnosed with hypertension, you may have other blood or imaging tests to help your health care provider understand your overall risk for other conditions. How is this treated? This condition is treated by making healthy lifestyle changes, such as eating healthy foods, exercising more, and reducing your alcohol intake. You may be referred for counseling on a healthy diet and physical activity. Your health care provider may prescribe medicine if lifestyle changes are not enough to get your blood pressure under control and if: Your systolic blood pressure is above 130. Your diastolic blood pressure is above 80. Your personal target blood pressure may vary depending on your medical conditions, your age, and other factors. Follow these instructions at home: Eating and drinking  Eat a diet that is high in fiber and potassium, and low in sodium, added sugar, and fat. An example of this eating plan is called the DASH diet. DASH stands for Dietary Approaches to Stop Hypertension. To eat this way: Eat   plenty of fresh fruits and vegetables. Try to fill one half of your plate at each meal with fruits and vegetables. Eat whole grains, such as whole-wheat pasta, brown rice, or whole-grain bread. Fill about one  fourth of your plate with whole grains. Eat or drink low-fat dairy products, such as skim milk or low-fat yogurt. Avoid fatty cuts of meat, processed or cured meats, and poultry with skin. Fill about one fourth of your plate with lean proteins, such as fish, chicken without skin, beans, eggs, or tofu. Avoid pre-made and processed foods. These tend to be higher in sodium, added sugar, and fat. Reduce your daily sodium intake. Many people with hypertension should eat less than 1,500 mg of sodium a day. Do not drink alcohol if: Your health care provider tells you not to drink. You are pregnant, may be pregnant, or are planning to become pregnant. If you drink alcohol: Limit how much you have to: 0-1 drink a day for women. 0-2 drinks a day for men. Know how much alcohol is in your drink. In the [place] one drink equals one 12 oz bottle of beer (355 mL), one 5 oz glass of wine (148 mL), or one 1 oz glass of hard liquor (44 mL). Lifestyle  Work with your health care provider to maintain a healthy body weight or to lose weight. Ask what an ideal weight is for you. Get at least 30 minutes of exercise that causes your heart to beat faster (aerobic exercise) most days of the week. Activities may include walking, swimming, or biking. Include exercise to strengthen your muscles (resistance exercise), such as Pilates or lifting weights, as part of your weekly exercise routine. Try to do these types of exercises for 30 minutes at least 3 days a week. Do not use any products that contain nicotine or tobacco. These products include cigarettes, chewing tobacco, and vaping devices, such as e-cigarettes. If you need help quitting, ask your health care provider. Monitor your blood pressure at home as told by your health care provider. Keep all follow-up visits. This is important. Medicines Take over-the-counter and prescription medicines only as told by your health care provider. Follow directions carefully. Blood  pressure medicines must be taken as prescribed. Do not skip doses of blood pressure medicine. Doing this puts you at risk for problems and can make the medicine less effective. Ask your health care provider about side effects or reactions to medicines that you should watch for. Contact a health care provider if you: Think you are having a reaction to a medicine you are taking. Have headaches that keep coming back (recurring). Feel dizzy. Have swelling in your ankles. Have trouble with your vision. Get help right away if you: Develop a severe headache or confusion. Have unusual weakness or numbness. Feel faint. Have severe pain in your chest or abdomen. Vomit repeatedly. Have trouble breathing. These symptoms may be an emergency. Get help right away. Call 911. Do not wait to see if the symptoms will go away. Do not drive yourself to the hospital. Summary Hypertension is when the force of blood pumping through your arteries is too strong. If this condition is not controlled, it may put you at risk for serious complications. Your personal target blood pressure may vary depending on your medical conditions, your age, and other factors. For most people, a normal blood pressure is less than 120/80. Hypertension is treated with lifestyle changes, medicines, or a combination of both. Lifestyle changes include losing weight, eating a healthy,   low-sodium diet, exercising more, and limiting alcohol. This information is not intended to replace advice given to you by your health care provider. Make sure you discuss any questions you have with your health care provider. Document Revised: 10/11/2021 Document Reviewed: 10/11/2021 Elsevier Patient Education  2023 [employer]  

## 2022-11-29 NOTE — Progress Notes (Signed)
Subjective:  Patient ID: Rita Harrison, female    DOB: 17-Jun-1981  Age: 41 y.o. MRN: [id number]  CC: Hypertension and Headache   HPI Semone L Morin presents for f/up -   She has been diagnosed with rebound headaches and has started taking a triptan and topamax.   Outpatient Medications Prior to Visit  Medication Sig Dispense Refill   ALPRAZolam (XANAX) 0.25 MG tablet Take 1 tablet (0.25 mg total) by mouth 2 (two) times daily as needed for anxiety. 20 tablet 3   Aluminum Chloride in Alcohol 6.25 % SOLN Apply 1 actuation topically at bedtime as directed 60 mL 3   clindamycin (CLEOCIN T) 1 % lotion APPLY A THIN LAYER TO THE AFFECTED AREA(S) 2 TIMES PER DAY AS NEEDED 60 mL 1   cyclobenzaprine (FLEXERIL) 10 MG tablet Take 1 tablet (10 mg total) by mouth 3 (three) times daily as needed for muscle spasms. 90 tablet 5   Fluocinolone Acetonide Body (DERMA-SMOOTHE/FS BODY) 0.01 % OIL Apply topically at bedtime as directed. 118 mL 1   ketoconazole (NIZORAL) 2 % shampoo SHAMPOO SCALP, LET SIT 5 MINUTES, THEN RINSE OUT. 120 mL 2   nebivolol (BYSTOLIC) 10 MG tablet Take 1 tablet (10 mg total) by mouth daily. 90 tablet 3   omeprazole (PRILOSEC) 20 MG capsule Take 1 capsule (20 mg total) by mouth daily. 30 capsule 1   SUMAtriptan (IMITREX) 100 MG tablet Take 1 tablet (100 mg total) by mouth once as needed for up to 1 dose for migraine. May repeat in 2 hours if headache persists or recurs. 10 tablet 2   topiramate (TOPAMAX) 50 MG tablet Take 1 tablet (50 mg total) by mouth at bedtime. 30 tablet 5   triamterene-hydrochlorothiazide (DYAZIDE) 37.5-25 MG capsule Take 1 capsule by mouth daily. 90 capsule 0   ibuprofen (ADVIL) 800 MG tablet Take 1 tablet (800 mg total) by mouth every 8 (eight) hours as needed. 30 tablet 0   Rimegepant Sulfate (NURTEC) 75 MG TBDP Take 1 tablet by mouth every other day. 46 tablet 1   No facility-administered medications prior to visit.    ROS Review of Systems   Constitutional: Negative.  Negative for diaphoresis, fatigue and unexpected weight change.  HENT: Negative.    Eyes: Negative.   Respiratory:  Negative for cough, chest tightness, shortness of breath and wheezing.   Cardiovascular:  Negative for chest pain, palpitations and leg swelling.  Gastrointestinal:  Negative for abdominal pain, diarrhea, nausea and vomiting.  Endocrine: Negative.   Genitourinary:  Negative for difficulty urinating.  Musculoskeletal: Negative.  Negative for arthralgias and myalgias.  Skin: Negative.   Neurological:  Positive for headaches. Negative for dizziness and weakness.  Hematological:  Negative for adenopathy. Does not bruise/bleed easily.  Psychiatric/Behavioral: Negative.      Objective:  BP (!) 142/88 (BP Location: Left Arm, Patient Position: Sitting, Cuff Size: Large)   Pulse 77   Temp 98.1 F (36.7 C) (Oral)   Resp 16   Ht 5' 6.5" (1.689 m)   Wt 198 lb (89.8 kg)   SpO2 98%   BMI 31.48 kg/m   BP Readings from Last 3 Encounters:  11/29/22 (!) 142/88  11/26/22 (!) 142/101  11/22/22 129/81    Wt Readings from Last 3 Encounters:  11/29/22 198 lb (89.8 kg)  11/26/22 195 lb (88.5 kg)  11/02/22 198 lb (89.8 kg)    Physical Exam Vitals reviewed.  HENT:     Mouth/Throat:     Mouth:  Mucous membranes are moist.  Eyes:     General: No scleral icterus.    Conjunctiva/sclera: Conjunctivae normal.  Cardiovascular:     Rate and Rhythm: Normal rate and regular rhythm.     Heart sounds: No murmur heard. Pulmonary:     Effort: Pulmonary effort is normal.     Breath sounds: No stridor. No wheezing, rhonchi or rales.  Abdominal:     General: Abdomen is flat.     Palpations: There is no mass.     Tenderness: There is no abdominal tenderness. There is no guarding.     Hernia: No hernia is present.  Musculoskeletal:        General: Normal range of motion.     Cervical back: Neck supple.     Right lower leg: No edema.     Left lower leg: No  edema.  Lymphadenopathy:     Cervical: No cervical adenopathy.  Skin:    General: Skin is warm and dry.  Neurological:     General: No focal deficit present.     Mental Status: She is alert.  Psychiatric:        Mood and Affect: Mood normal.        Behavior: Behavior normal.     Lab Results  Component Value Date   WBC 10.7 (H) 11/29/2022   HGB 13.5 11/29/2022   HCT 39.2 11/29/2022   PLT 291.0 11/29/2022   GLUCOSE 74 11/29/2022   CHOL 180 11/09/2020   TRIG 82.0 11/09/2020   HDL 41.30 11/09/2020   LDLCALC 122 (H) 11/09/2020   ALT 25 11/29/2022   AST 19 11/29/2022   NA 137 11/29/2022   K 3.3 (L) 11/29/2022   CL 100 11/29/2022   CREATININE 1.08 11/29/2022   BUN 20 11/29/2022   CO2 30 11/29/2022   TSH 1.04 03/02/2022   INR 1.08 04/23/2013   HGBA1C 5.6 02/21/2018    No results found.  Assessment & Plan:   Aaylah was seen today for hypertension and headache.  Diagnoses and all orders for this visit:  Primary hypertension- She has not achieved her BP goal. Will continue the current meds and will treat the hypokalemia. -     Basic metabolic panel; Future -     CBC with Differential/Platelet; Future -     Hepatic function panel; Future -     Hepatic function panel -     CBC with Differential/Platelet -     Basic metabolic panel -     potassium chloride (KLOR-CON M) 10 MEQ tablet; Take 1 tablet (10 mEq total) by mouth 2 (two) times daily.  Chronic hypokalemia -     potassium chloride (KLOR-CON M) 10 MEQ tablet; Take 1 tablet (10 mEq total) by mouth 2 (two) times daily.   I have discontinued Audriella L. Pfiester's Nurtec and ibuprofen. I am also having her start on potassium chloride. Additionally, I am having her maintain her Fluocinolone Acetonide Body, Aluminum Chloride in Alcohol, ketoconazole, nebivolol, clindamycin, omeprazole, cyclobenzaprine, triamterene-hydrochlorothiazide, ALPRAZolam, topiramate, and SUMAtriptan.  Meds ordered this encounter  Medications    potassium chloride (KLOR-CON M) 10 MEQ tablet    Sig: Take 1 tablet (10 mEq total) by mouth 2 (two) times daily.    Dispense:  180 tablet    Refill:  1     Follow-up: Return in about 6 months (around 05/31/2023).  Sanda Linger, MD

## 2022-11-30 ENCOUNTER — Other Ambulatory Visit (HOSPITAL_COMMUNITY): Payer: Self-pay

## 2022-12-03 IMAGING — DX DG LUMBAR SPINE COMPLETE 4+V
5 series · 5 of 5 positions shown · non-contrast
Comparison: None.

CLINICAL DATA: Pain.

EXAM:
LUMBAR SPINE - COMPLETE 4+ VIEW

[l-spine ap]
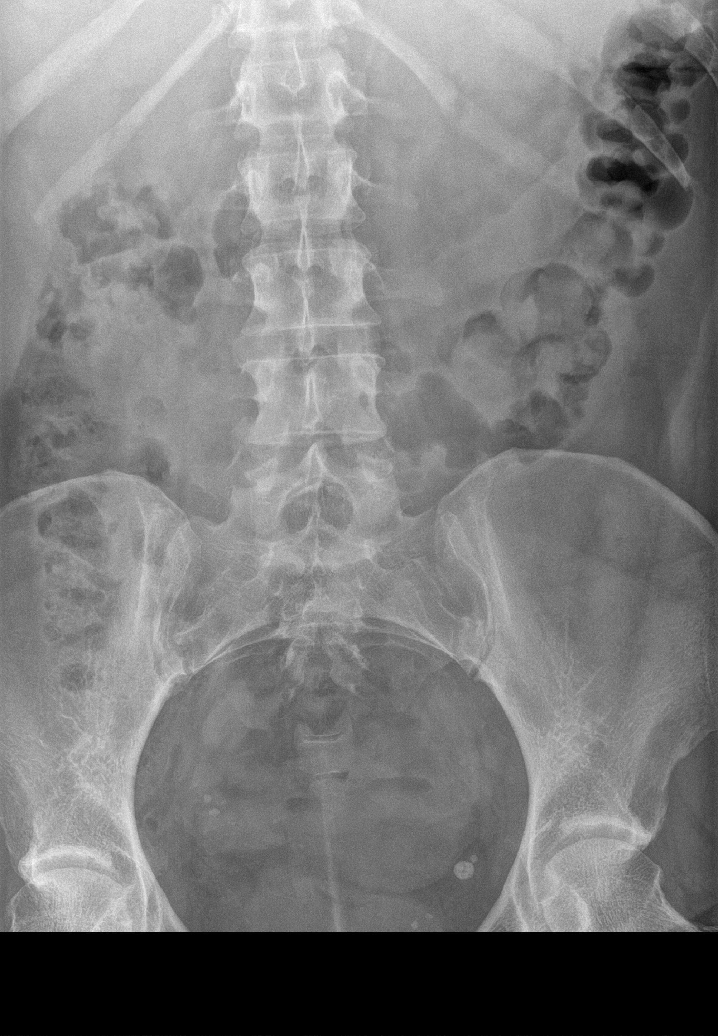

[l-spine obl (1 of 2)]
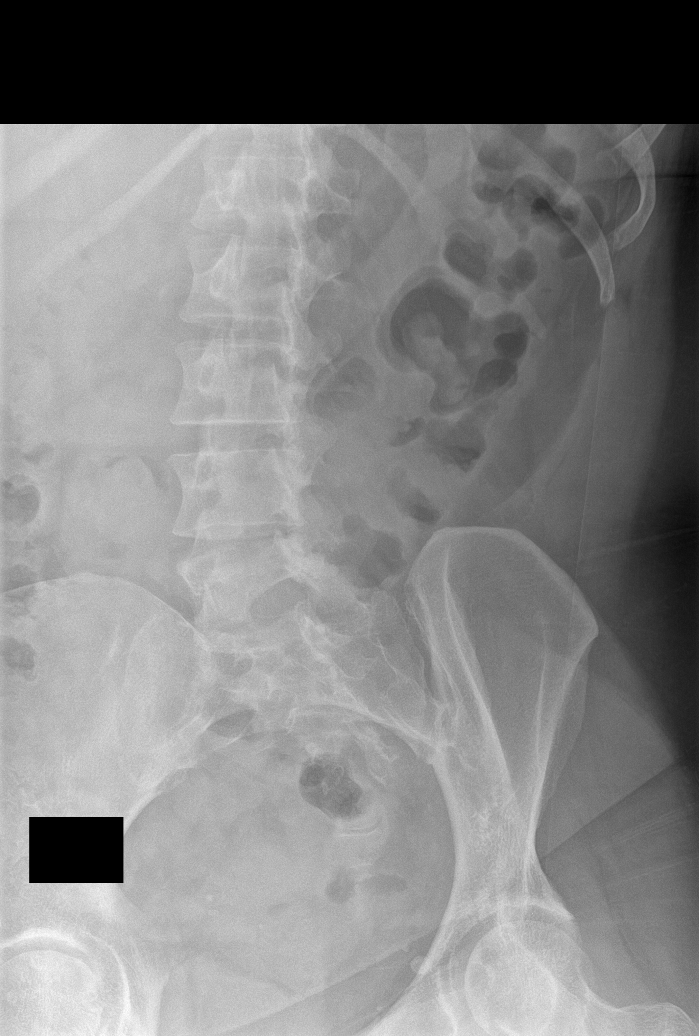

[l-spine obl (2 of 2)]
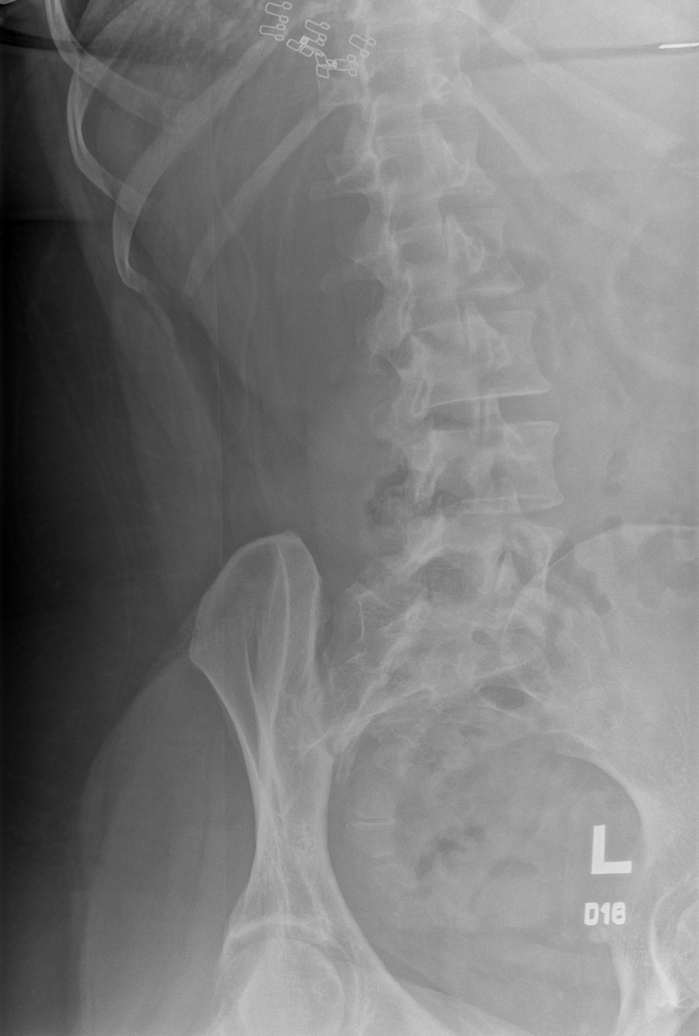

[l-spine lateral]
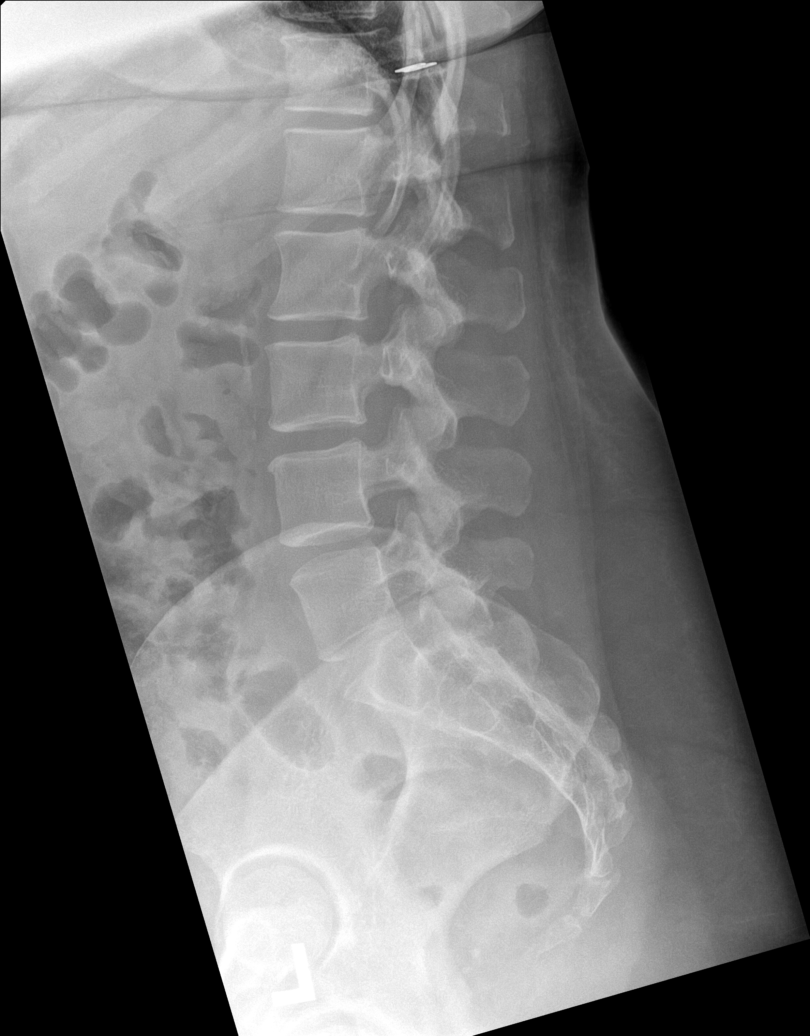

[l-spine spot]
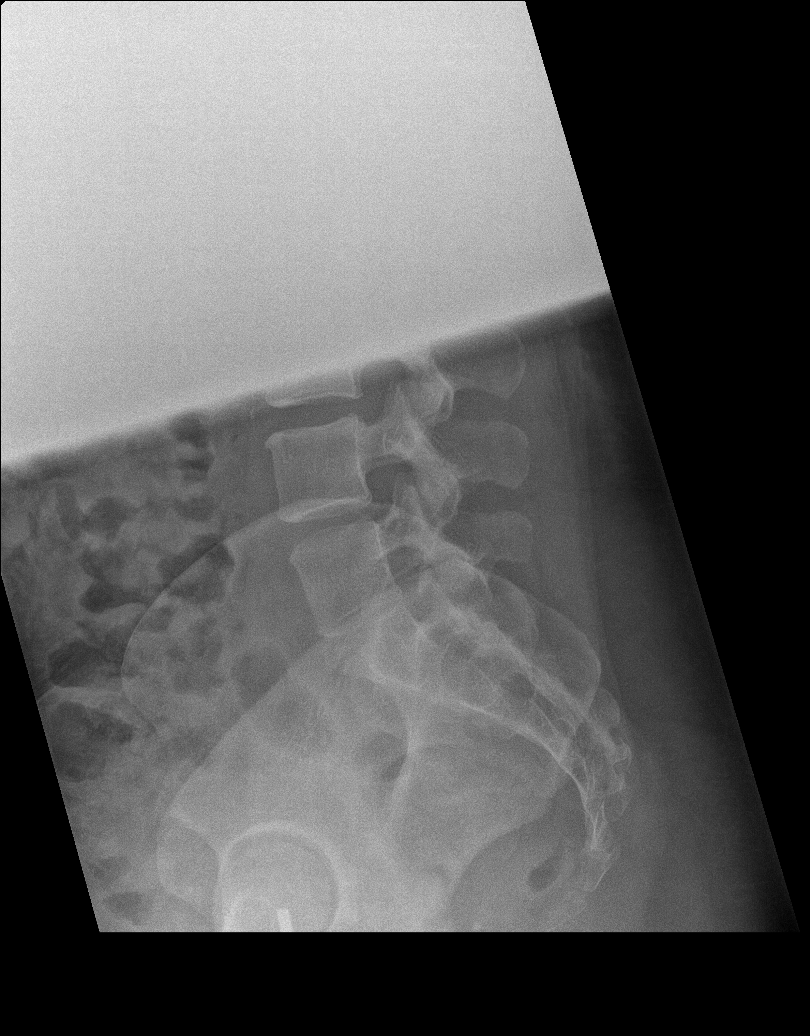

[5 of 5 positions shown; findings below may reference images not displayed]

FINDINGS: Mild degenerative changes are noted of the patient's lumbar spine.
There is no significant malalignment. No acute compression fracture.
There are phleboliths projecting over the patient's pelvis. There
are mild degenerative changes of both hips.
IMPRESSION: No acute osseous abnormality.

## 2022-12-05 ENCOUNTER — Encounter: Payer: Self-pay | Admitting: Neurology

## 2022-12-06 ENCOUNTER — Other Ambulatory Visit (HOSPITAL_COMMUNITY): Payer: Self-pay

## 2022-12-06 ENCOUNTER — Other Ambulatory Visit: Payer: Self-pay

## 2022-12-06 DIAGNOSIS — M542 Cervicalgia: Secondary | ICD-10-CM

## 2022-12-06 DIAGNOSIS — G43709 Chronic migraine without aura, not intractable, without status migrainosus: Secondary | ICD-10-CM

## 2022-12-06 MED ORDER — UBRELVY 100 MG PO TABS
100.0000 mg | ORAL_TABLET | ORAL | 5 refills | Status: DC | PRN
  Filled 2022-12-06 – 2022-12-22 (×2): qty 8, 30d supply, fill #0
  Filled 2023-01-20: qty 8, 21d supply, fill #0

## 2022-12-06 MED ORDER — TOPIRAMATE 100 MG PO TABS
100.0000 mg | ORAL_TABLET | Freq: Every day | ORAL | 5 refills | Status: DC
  Filled 2022-12-06 – 2022-12-15 (×2): qty 30, 30d supply, fill #0
  Filled 2023-01-20: qty 30, 30d supply, fill #1

## 2022-12-06 NOTE — Progress Notes (Unsigned)
Per Dr.Jaffe, We can go ahead and increase topiramate now to 100mg  at bedtime.  But at this dose, you will definitely need to give it at least 4 weeks to really determine its efficacy.  I know that you are in discomfort but there is no "quick fix".  I have no objections to trying acupuncture if that is what she would like to try.  Instead of sumatriptan, I would try Ubrelvy.  Again, no sumatriptan or over the counter pain relievers (like ibuprofen and acetaminophen).  I will have my staff send in prescription for following medications: 1. Topiramate 100mg  at bedtime Quantity 30 with 5 refills 2. Ubrelvy 100mg  - take as needed.  May repeat after 2 hours.  Maximum 2 tablets in 24 hours.  Quantity 10.  Refills 5.   Patient would like a referral for Acupuncture

## 2022-12-07 ENCOUNTER — Telehealth: Payer: Self-pay

## 2022-12-07 NOTE — Telephone Encounter (Signed)
Patient has an appt with [facility] and pain for 12/12/22 11 am

## 2022-12-12 ENCOUNTER — Emergency Department (HOSPITAL_BASED_OUTPATIENT_CLINIC_OR_DEPARTMENT_OTHER)
Admission: EM | Admit: 2022-12-12 | Discharge: 2022-12-13 | Disposition: A | Discharge status: Home / Self Care | Payer: 59 | Attending: Emergency Medicine | Admitting: Emergency Medicine

## 2022-12-12 ENCOUNTER — Encounter (HOSPITAL_BASED_OUTPATIENT_CLINIC_OR_DEPARTMENT_OTHER): Payer: Self-pay | Admitting: Emergency Medicine

## 2022-12-12 ENCOUNTER — Other Ambulatory Visit: Payer: Self-pay

## 2022-12-12 ENCOUNTER — Other Ambulatory Visit (HOSPITAL_COMMUNITY): Payer: Self-pay

## 2022-12-12 DIAGNOSIS — G8929 Other chronic pain: Secondary | ICD-10-CM | POA: Insufficient documentation

## 2022-12-12 DIAGNOSIS — R519 Headache, unspecified: Secondary | ICD-10-CM | POA: Insufficient documentation

## 2022-12-12 DIAGNOSIS — M5412 Radiculopathy, cervical region: Secondary | ICD-10-CM | POA: Diagnosis not present

## 2022-12-12 MED ORDER — DEXAMETHASONE SODIUM PHOSPHATE 10 MG/ML IJ SOLN
10.0000 mg | Freq: Once | INTRAMUSCULAR | Status: AC
  Administered 2022-12-12: 10 mg via INTRAVENOUS
  Filled 2022-12-12: qty 1

## 2022-12-12 MED ORDER — SODIUM CHLORIDE 0.9 % IV BOLUS
1000.0000 mL | Freq: Once | INTRAVENOUS | Status: AC
  Administered 2022-12-12: 1000 mL via INTRAVENOUS

## 2022-12-12 MED ORDER — DIPHENHYDRAMINE HCL 50 MG/ML IJ SOLN
25.0000 mg | Freq: Once | INTRAMUSCULAR | Status: AC
  Administered 2022-12-12: 25 mg via INTRAVENOUS
  Filled 2022-12-12: qty 1

## 2022-12-12 MED ORDER — KETOROLAC TROMETHAMINE 15 MG/ML IJ SOLN
15.0000 mg | Freq: Once | INTRAMUSCULAR | Status: AC
  Administered 2022-12-12: 15 mg via INTRAVENOUS
  Filled 2022-12-12: qty 1

## 2022-12-12 MED ORDER — PROCHLORPERAZINE EDISYLATE 10 MG/2ML IJ SOLN
10.0000 mg | Freq: Once | INTRAMUSCULAR | Status: AC
  Administered 2022-12-12: 10 mg via INTRAVENOUS
  Filled 2022-12-12: qty 2

## 2022-12-12 NOTE — ED Triage Notes (Signed)
Pt here form home with c/o migraine  was sent to get mri of her neck , pt told that we do not have MRi tonight , would still like to see a MD

## 2022-12-12 NOTE — ED Notes (Signed)
Pt. Reports daily migraine for 4 months and today she saw Dr. Who believes she needs an MRI.  Pt. Is asking for MRI here and we [facility]'t have [facility].  Pt. Reports the headache is in same spot.  L frontal to the L back side of her head.

## 2022-12-12 NOTE — ED Provider Notes (Signed)
[facility] EMERGENCY DEPARTMENT Provider Note   CSN: [id number] Arrival date & time: 12/12/22  1833     History  Chief Complaint  Patient presents with   Migraine    Rita Harrison is a 41 y.o. female.  HPI 41 year old female presents with headache and neck pain. She's been having daily headaches for 4+ months. Follows with neurology, Dr. Everlena Cooper. Has right sided headache that goes to the back and includes her neck.  Was at her acupuncturist today and they told her she should get an MRI of her neck due to the neck component.  She has been on multiple different meds and currently is getting a new prescription from Dr. Everlena Cooper, but has not yet been approved by insurance.  Otherwise, her headache is not new or worse or different today than typical.  Chronic nausea responds to Zofran but no vomiting.  No neck stiffness, weakness, numbness.  Home Medications Prior to Admission medications   Medication Sig Start Date End Date Taking? Authorizing Provider  ALPRAZolam (XANAX) 0.25 MG tablet Take 1 tablet (0.25 mg total) by mouth 2 (two) times daily as needed for anxiety. 11/13/22   Etta Grandchild, MD  Aluminum Chloride in Alcohol 6.25 % SOLN Apply 1 actuation topically at bedtime as directed 03/06/22   Etta Grandchild, MD  clindamycin (CLEOCIN T) 1 % lotion APPLY A THIN LAYER TO THE AFFECTED AREA(S) 2 TIMES PER DAY AS NEEDED 07/14/22     cyclobenzaprine (FLEXERIL) 10 MG tablet Take 1 tablet (10 mg total) by mouth 3 (three) times daily as needed for muscle spasms. 11/02/22   Drema Dallas, DO  Fluocinolone Acetonide Body (DERMA-SMOOTHE/FS BODY) 0.01 % OIL Apply topically at bedtime as directed. 03/03/22   Etta Grandchild, MD  ketoconazole (NIZORAL) 2 % shampoo SHAMPOO SCALP, LET SIT 5 MINUTES, THEN RINSE OUT. 05/19/22 05/19/23  Etta Grandchild, MD  nebivolol (BYSTOLIC) 10 MG tablet Take 1 tablet (10 mg total) by mouth daily. 07/14/22   Pincus Sanes, MD  omeprazole (PRILOSEC) 20 MG capsule  Take 1 capsule (20 mg total) by mouth daily. 09/08/22   Henson, Vickie L, NP-C  potassium chloride (KLOR-CON M) 10 MEQ tablet Take 1 tablet (10 mEq total) by mouth 2 (two) times daily. 11/29/22   Etta Grandchild, MD  SUMAtriptan (IMITREX) 100 MG tablet Take 1 tablet (100 mg total) by mouth once as needed for up to 1 dose for migraine. May repeat in 2 hours if headache persists or recurs. 11/23/22   Drema Dallas, DO  topiramate (TOPAMAX) 100 MG tablet Take 1 tablet (100 mg total) by mouth at bedtime. 12/06/22   Drema Dallas, DO  triamterene-hydrochlorothiazide (DYAZIDE) 37.5-25 MG capsule Take 1 capsule by mouth daily. 11/13/22   Etta Grandchild, MD  Ubrogepant (UBRELVY) 100 MG TABS Take 100 mg by mouth as needed for up to 1 dose (May repeat after 2 hours.  Maximum 2 tablets in 24 hours.). 12/06/22   Drema Dallas, DO      Allergies    Lisinopril, Indapamide, and Spironolactone    Review of Systems   Review of Systems  Constitutional:  Negative for fever.  Eyes:  Positive for photophobia. Negative for visual disturbance.  Gastrointestinal:  Positive for nausea. Negative for vomiting.  Musculoskeletal:  Positive for neck pain. Negative for neck stiffness.  Neurological:  Positive for headaches. Negative for weakness and numbness.    Physical Exam Updated Vital Signs  BP 118/80   Pulse 71   Temp 98.7 F (37.1 C)   Resp 15   SpO2 97%  Physical Exam Vitals and nursing note reviewed.  Constitutional:      General: She is not in acute distress.    Appearance: She is well-developed. She is not ill-appearing.  HENT:     Head: Normocephalic and atraumatic.  Eyes:     Extraocular Movements: Extraocular movements intact.     Pupils: Pupils are equal, round, and reactive to light.  Cardiovascular:     Rate and Rhythm: Normal rate and regular rhythm.     Heart sounds: Normal heart sounds.  Pulmonary:     Effort: Pulmonary effort is normal.     Breath sounds: Normal breath sounds.   Abdominal:     Palpations: Abdomen is soft.     Tenderness: There is no abdominal tenderness.  Musculoskeletal:     Cervical back: Normal range of motion. No rigidity.  Skin:    General: Skin is warm and dry.  Neurological:     Mental Status: She is alert.     Comments: CN 3-12 grossly intact. 5/5 strength in all 4 extremities. Grossly normal sensation. Normal finger to nose.      ED Results / Procedures / Treatments   Labs (all labs ordered are listed, but only abnormal results are displayed) Labs Reviewed - No data to display  EKG None  Radiology No results found.  Procedures Procedures    Medications Ordered in ED Medications  dexamethasone (DECADRON) injection 10 mg (has no administration in time range)  ketorolac (TORADOL) 15 MG/ML injection 15 mg (has no administration in time range)  prochlorperazine (COMPAZINE) injection 10 mg (has no administration in time range)  diphenhydrAMINE (BENADRYL) injection 25 mg (has no administration in time range)  sodium chloride 0.9 % bolus 1,000 mL (1,000 mLs Intravenous New Bag/Given 12/12/22 2319)    ED Course/ Medical Decision Making/ A&P                           Medical Decision Making Amount and/or Complexity of Data Reviewed External Data Reviewed: notes.  Risk Prescription drug management.   Patient presents with chronic headache/neck pain.  No meningismus on exam.  No fevers or other alarming signs/symptoms.  Vitals are normal.  She has a chronic headache and we will give her some headache type medicine to see if this helps though we discussed that ultimately she will need outpatient workup from her neurologist.  I do not know if the acupuncturist sent her here for an emergent MRI or just in general told her she needs 1 but no emergent MRI would be clinically warranted even if we had this available.  She will be given headache medicine and care has been transferred to Dr. Read Drivers.        Final Clinical  Impression(s) / ED Diagnoses Final diagnoses:  Chronic intractable headache, unspecified headache type    Rx / DC Orders ED Discharge Orders     None         Pricilla Loveless, MD 12/12/22 2322

## 2022-12-12 NOTE — Discharge Instructions (Addendum)
If you develop continued, recurrent, or worsening headache, fever, neck stiffness, vomiting, blurry or double vision, weakness or numbness in your arms or legs, trouble speaking, or any other new/concerning symptoms then return to the ER for evaluation.  

## 2022-12-13 ENCOUNTER — Other Ambulatory Visit: Payer: Self-pay | Admitting: Anesthesiology

## 2022-12-13 DIAGNOSIS — M5412 Radiculopathy, cervical region: Secondary | ICD-10-CM

## 2022-12-13 NOTE — ED Notes (Signed)
RN reviewed follow up instructions w/ pt, no further questions at this time.

## 2022-12-13 NOTE — ED Provider Notes (Signed)
12:34 AM Headache significantly improved.  Patient ready to go home.     Jaymz Traywick, Jonny Ruiz, MD 12/13/22 [phone]

## 2022-12-15 ENCOUNTER — Other Ambulatory Visit (HOSPITAL_COMMUNITY): Payer: Self-pay

## 2022-12-19 ENCOUNTER — Other Ambulatory Visit: Payer: Self-pay | Admitting: Anesthesiology

## 2022-12-19 DIAGNOSIS — M5412 Radiculopathy, cervical region: Secondary | ICD-10-CM

## 2022-12-20 ENCOUNTER — Other Ambulatory Visit (HOSPITAL_COMMUNITY): Payer: Self-pay

## 2022-12-20 ENCOUNTER — Other Ambulatory Visit: Payer: Self-pay | Admitting: Internal Medicine

## 2022-12-20 ENCOUNTER — Encounter: Payer: Self-pay | Admitting: Internal Medicine

## 2022-12-20 DIAGNOSIS — I10 Essential (primary) hypertension: Secondary | ICD-10-CM

## 2022-12-20 MED ORDER — AMLODIPINE BESYLATE 5 MG PO TABS
5.0000 mg | ORAL_TABLET | Freq: Every day | ORAL | 0 refills | Status: DC
  Filled 2022-12-20: qty 90, 90d supply, fill #0

## 2022-12-21 ENCOUNTER — Encounter (HOSPITAL_BASED_OUTPATIENT_CLINIC_OR_DEPARTMENT_OTHER): Payer: Self-pay | Admitting: Urology

## 2022-12-21 ENCOUNTER — Other Ambulatory Visit: Payer: Self-pay

## 2022-12-21 ENCOUNTER — Emergency Department (HOSPITAL_BASED_OUTPATIENT_CLINIC_OR_DEPARTMENT_OTHER)
Admission: EM | Admit: 2022-12-21 | Discharge: 2022-12-21 | Disposition: A | Discharge status: Home / Self Care | Payer: 59 | Attending: Emergency Medicine | Admitting: Emergency Medicine

## 2022-12-21 ENCOUNTER — Other Ambulatory Visit (HOSPITAL_COMMUNITY): Payer: Self-pay

## 2022-12-21 DIAGNOSIS — R519 Headache, unspecified: Secondary | ICD-10-CM | POA: Insufficient documentation

## 2022-12-21 DIAGNOSIS — Z1152 Encounter for screening for COVID-19: Secondary | ICD-10-CM | POA: Insufficient documentation

## 2022-12-21 DIAGNOSIS — R051 Acute cough: Secondary | ICD-10-CM | POA: Diagnosis not present

## 2022-12-21 LAB — RESP PANEL BY RT-PCR (RSV, FLU A&B, COVID)  RVPGX2
Influenza A by PCR: NEGATIVE
Influenza B by PCR: NEGATIVE
Resp Syncytial Virus by PCR: NEGATIVE
SARS Coronavirus 2 by RT PCR: NEGATIVE

## 2022-12-21 LAB — GROUP A STREP BY PCR: Group A Strep by PCR: NOT DETECTED

## 2022-12-21 MED ORDER — PROCHLORPERAZINE EDISYLATE 10 MG/2ML IJ SOLN
10.0000 mg | Freq: Once | INTRAMUSCULAR | Status: AC
  Administered 2022-12-21: 10 mg via INTRAMUSCULAR
  Filled 2022-12-21: qty 2

## 2022-12-21 MED ORDER — DIPHENHYDRAMINE HCL 25 MG PO CAPS
25.0000 mg | ORAL_CAPSULE | Freq: Once | ORAL | Status: AC
  Administered 2022-12-21: 25 mg via ORAL
  Filled 2022-12-21: qty 1

## 2022-12-21 MED ORDER — KETOROLAC TROMETHAMINE 15 MG/ML IJ SOLN
15.0000 mg | Freq: Once | INTRAMUSCULAR | Status: AC
  Administered 2022-12-21: 15 mg via INTRAMUSCULAR
  Filled 2022-12-21: qty 1

## 2022-12-21 MED ORDER — BENZONATATE 100 MG PO CAPS
100.0000 mg | ORAL_CAPSULE | Freq: Three times a day (TID) | ORAL | 0 refills | Status: DC
  Filled 2022-12-21: qty 21, 7d supply, fill #0

## 2022-12-21 NOTE — ED Notes (Signed)
Topaz not working... Pt verbalized understanding  

## 2022-12-21 NOTE — ED Triage Notes (Signed)
Pt states continued migraine since 12/26 and sore throat that started yesterday  Denies fever

## 2022-12-21 NOTE — Discharge Instructions (Signed)
Please follow-up with your neurologist for further evaluation.  I have given you prescription for Tessalon Perles which should help with your cough.  Please take as prescribed.  Return to the emergency room at anytime for any worsening symptoms.

## 2022-12-21 NOTE — ED Notes (Signed)
ED Provider at bedside. 

## 2022-12-21 NOTE — ED Provider Notes (Signed)
[facility] EMERGENCY DEPARTMENT Provider Note   CSN: [id number] Arrival date & time: 12/21/22  1702     History Chief Complaint  Patient presents with   Sore Throat   Migraine    Rita Harrison is a 42 y.o. female with a longstanding history of migraines who presents to the emergency department today with a headache this been ongoing for 3 days.  Patient states this feels identical to her previous migraines.  She tried her home remedies at home that have not relieved.  Patient is post be on preventative medication from her neurologist but she is having an insurance issue and she is unable to get her medications filled.  She has been out of her medications for roughly a month.  She denies any focal weakness, numbness, fever, chills.  Coincidently, patient is also been battling a cough for the last several days.  She denies any chest pain or shortness of breath.   Sore Throat  Migraine       Home Medications Prior to Admission medications   Medication Sig Start Date End Date Taking? Authorizing Provider  amLODipine (NORVASC) 5 MG tablet Take 1 tablet (5 mg total) by mouth daily. 12/20/22   Janith Lima, MD  benzonatate (TESSALON) 100 MG capsule Take 1 capsule (100 mg total) by mouth every 8 (eight) hours. 12/21/22  Yes Raul Del, Devontay Celaya M, PA-C  ALPRAZolam Duanne Moron) 0.25 MG tablet Take 1 tablet (0.25 mg total) by mouth 2 (two) times daily as needed for anxiety. 11/13/22   Janith Lima, MD  Aluminum Chloride in Alcohol 6.25 % SOLN Apply 1 actuation topically at bedtime as directed 03/06/22   Janith Lima, MD  clindamycin (CLEOCIN T) 1 % lotion APPLY A THIN LAYER TO THE AFFECTED AREA(S) 2 TIMES PER DAY AS NEEDED 07/14/22     cyclobenzaprine (FLEXERIL) 10 MG tablet Take 1 tablet (10 mg total) by mouth 3 (three) times daily as needed for muscle spasms. 11/02/22   Pieter Partridge, DO  Fluocinolone Acetonide Body (DERMA-SMOOTHE/FS BODY) 0.01 % OIL Apply topically at bedtime as  directed. 03/03/22   Janith Lima, MD  ketoconazole (NIZORAL) 2 % shampoo SHAMPOO SCALP, LET SIT 5 MINUTES, THEN RINSE OUT. 05/19/22 05/19/23  Janith Lima, MD  nebivolol (BYSTOLIC) 10 MG tablet Take 1 tablet (10 mg total) by mouth daily. 07/14/22   Binnie Rail, MD  omeprazole (PRILOSEC) 20 MG capsule Take 1 capsule (20 mg total) by mouth daily. 09/08/22   Henson, Vickie L, NP-C  potassium chloride (KLOR-CON M) 10 MEQ tablet Take 1 tablet (10 mEq total) by mouth 2 (two) times daily. 11/29/22   Janith Lima, MD  SUMAtriptan (IMITREX) 100 MG tablet Take 1 tablet (100 mg total) by mouth once as needed for up to 1 dose for migraine. May repeat in 2 hours if headache persists or recurs. 11/23/22   Pieter Partridge, DO  topiramate (TOPAMAX) 100 MG tablet Take 1 tablet (100 mg total) by mouth at bedtime. 12/06/22   Pieter Partridge, DO  triamterene-hydrochlorothiazide (DYAZIDE) 37.5-25 MG capsule Take 1 capsule by mouth daily. 11/13/22   Janith Lima, MD  Ubrogepant (UBRELVY) 100 MG TABS Take 100 mg (1 tablet) by mouth as needed for up to 1 dose (May repeat after 2 hours.  Maximum 2 tablets in 24 hours.). 12/06/22   Pieter Partridge, DO      Allergies    Lisinopril, Indapamide, and Spironolactone  Review of Systems   Review of Systems  All other systems reviewed and are negative.   Physical Exam Updated Vital Signs BP (!) 149/94 (BP Location: Right Arm)   Pulse 78   Temp 98.1 F (36.7 C) (Oral)   Resp 15   Ht 5\' 6"  (1.676 m)   Wt 88.5 kg   SpO2 100%   BMI 31.49 kg/m  Physical Exam Vitals and nursing note reviewed.  Constitutional:      General: She is not in acute distress.    Appearance: Normal appearance.  HENT:     Head: Normocephalic and atraumatic.  Eyes:     General:        Right eye: No discharge.        Left eye: No discharge.  Cardiovascular:     Comments: Regular rate and rhythm.  S1/S2 are distinct without any evidence of murmur, rubs, or gallops.  Radial pulses are 2+  bilaterally.  Dorsalis pedis pulses are 2+ bilaterally.  No evidence of pedal edema. Pulmonary:     Comments: Clear to auscultation bilaterally.  Normal effort.  No respiratory distress.  No evidence of wheezes, rales, or rhonchi heard throughout. Abdominal:     General: Abdomen is flat. Bowel sounds are normal. There is no distension.     Tenderness: There is no abdominal tenderness. There is no guarding or rebound.  Musculoskeletal:        General: Normal range of motion.     Cervical back: Neck supple.  Skin:    General: Skin is warm and dry.     Findings: No rash.  Neurological:     General: No focal deficit present.     Mental Status: She is alert.     GCS: GCS eye subscore is 4. GCS verbal subscore is 5. GCS motor subscore is 6.     Comments: Cranial nerves II through XII are intact.  5/5 strength to the upper and lower extremities.  Normal sensation to the upper and lower extremities.  Speech is normal.  No facial droop.  No pronator drift.  No dysmetria with finger-nose.  Psychiatric:        Mood and Affect: Mood normal.        Behavior: Behavior normal.     ED Results / Procedures / Treatments   Labs (all labs ordered are listed, but only abnormal results are displayed) Labs Reviewed  GROUP A STREP BY PCR  RESP PANEL BY RT-PCR (RSV, FLU A&B, COVID)  [id number]    EKG None  Radiology No results found.  Procedures Procedures    Medications Ordered in ED Medications  ketorolac (TORADOL) 15 MG/ML injection 15 mg (has no administration in time range)  prochlorperazine (COMPAZINE) injection 10 mg (has no administration in time range)  diphenhydrAMINE (BENADRYL) capsule 25 mg (has no administration in time range)    ED Course/ Medical Decision Making/ A&P                           Medical Decision Making Rita Harrison is a 42 y.o. female patient who presents to the emergency department today for further evaluation of headache.  I suspect this is likely related to  her migraine.  Her neurological exam is completely normal.  She is acting appropriately and answers all my questions.  She is GCS 15.  I have a low suspicion for stroke or hemorrhage.  Do not feel that imaging today is  warranted.  With respect to her cough, respiratory panel was negative.  I personally reviewed this study.  I will plan to give her Toradol, Compazine, and Benadryl plan to discharge.  Shared decision making was done regarding to give her fluids.  Patient ultimately denied at this time.  She has an MRI of the cervical spine scheduled for sometime next week.  She has good follow-up with her neurologist.  Strict return precautions were discussed.  She is safe for discharge.   Risk Prescription drug management.   Final Clinical Impression(s) / ED Diagnoses Final diagnoses:  Acute nonintractable headache, unspecified headache type  Acute cough    Rx / DC Orders ED Discharge Orders          Ordered    benzonatate (TESSALON) 100 MG capsule  Every 8 hours        12/21/22 1845              Hendricks Limes, PA-C 12/21/22 1845    Lennice Sites, DO 12/21/22 2014

## 2022-12-22 ENCOUNTER — Other Ambulatory Visit (HOSPITAL_COMMUNITY): Payer: Self-pay

## 2023-01-01 ENCOUNTER — Telehealth: Payer: Self-pay | Admitting: Internal Medicine

## 2023-01-01 NOTE — Telephone Encounter (Signed)
Patient would like a tb test done - please call patient and let her know  Patient# [phone]

## 2023-01-02 ENCOUNTER — Inpatient Hospital Stay: Admission: RE | Admit: 2023-01-02 | Payer: Self-pay | Source: Ambulatory Visit

## 2023-01-12 ENCOUNTER — Ambulatory Visit
Admission: RE | Admit: 2023-01-12 | Discharge: 2023-01-12 | Disposition: A | Discharge status: Home / Self Care | Payer: 59 | Source: Ambulatory Visit | Attending: Anesthesiology | Admitting: Anesthesiology

## 2023-01-12 ENCOUNTER — Telehealth: Payer: Self-pay | Admitting: Pharmacy Technician

## 2023-01-12 DIAGNOSIS — M542 Cervicalgia: Secondary | ICD-10-CM | POA: Diagnosis not present

## 2023-01-12 DIAGNOSIS — M5412 Radiculopathy, cervical region: Secondary | ICD-10-CM

## 2023-01-12 DIAGNOSIS — R200 Anesthesia of skin: Secondary | ICD-10-CM | POA: Diagnosis not present

## 2023-01-12 NOTE — Telephone Encounter (Signed)
Patient Advocate Encounter   Received notification that prior authorization for Ubrelvy 100MG  tablets is required.   PA submitted on 01/12/2023 Key [id number] Status is pending       Lyndel Safe, [facility] Patient Advocate Specialist [facility] Patient Advocate Team Direct Number: [phone]  Fax: [phone]

## 2023-01-15 ENCOUNTER — Encounter: Payer: Self-pay | Admitting: Neurology

## 2023-01-16 ENCOUNTER — Other Ambulatory Visit (HOSPITAL_COMMUNITY): Payer: Self-pay

## 2023-01-16 NOTE — Telephone Encounter (Signed)
Pharmacy Patient Advocate Encounter  Prior Authorization for Rita Harrison 100MG  tablets has been approved.    PA# PA Case ID: [id number] Effective dates: 01/12/2023 through 07/12/2023

## 2023-01-20 ENCOUNTER — Other Ambulatory Visit (HOSPITAL_COMMUNITY): Payer: Self-pay

## 2023-01-23 ENCOUNTER — Ambulatory Visit (INDEPENDENT_AMBULATORY_CARE_PROVIDER_SITE_OTHER): Payer: 59 | Admitting: Internal Medicine

## 2023-01-23 ENCOUNTER — Encounter: Payer: Self-pay | Admitting: Internal Medicine

## 2023-01-23 VITALS — BP 136/86 | HR 71 | Temp 98.6°F | Resp 16 | Ht 66.0 in | Wt 194.8 lb

## 2023-01-23 DIAGNOSIS — M5020 Other cervical disc displacement, unspecified cervical region: Secondary | ICD-10-CM | POA: Diagnosis not present

## 2023-01-23 DIAGNOSIS — R400 Somnolence: Secondary | ICD-10-CM | POA: Diagnosis not present

## 2023-01-23 DIAGNOSIS — E876 Hypokalemia: Secondary | ICD-10-CM | POA: Diagnosis not present

## 2023-01-23 DIAGNOSIS — N1831 Chronic kidney disease, stage 3a: Secondary | ICD-10-CM | POA: Diagnosis not present

## 2023-01-23 DIAGNOSIS — I10 Essential (primary) hypertension: Secondary | ICD-10-CM

## 2023-01-23 LAB — CBC WITH DIFFERENTIAL/PLATELET
Basophils Absolute: 0 10*3/uL (ref 0.0–0.1)
Basophils Relative: 0.6 % (ref 0.0–3.0)
Eosinophils Absolute: 0.1 10*3/uL (ref 0.0–0.7)
Eosinophils Relative: 1.2 % (ref 0.0–5.0)
HCT: 40.3 % (ref 36.0–46.0)
Hemoglobin: 13.9 g/dL (ref 12.0–15.0)
Lymphocytes Relative: 25.6 % (ref 12.0–46.0)
Lymphs Abs: 2.1 10*3/uL (ref 0.7–4.0)
MCHC: 34.5 g/dL (ref 30.0–36.0)
MCV: 82.6 fl (ref 78.0–100.0)
Monocytes Absolute: 0.4 10*3/uL (ref 0.1–1.0)
Monocytes Relative: 4.7 % (ref 3.0–12.0)
Neutro Abs: 5.6 10*3/uL (ref 1.4–7.7)
Neutrophils Relative %: 67.9 % (ref 43.0–77.0)
Platelets: 304 10*3/uL (ref 150.0–400.0)
RBC: 4.88 Mil/uL (ref 3.87–5.11)
RDW: 13.6 % (ref 11.5–15.5)
WBC: 8.2 10*3/uL (ref 4.0–10.5)

## 2023-01-23 LAB — BASIC METABOLIC PANEL
BUN: 17 mg/dL (ref 6–23)
CO2: 24 mEq/L (ref 19–32)
Calcium: 9.6 mg/dL (ref 8.4–10.5)
Chloride: 104 mEq/L (ref 96–112)
Creatinine, Ser: 1.48 mg/dL — ABNORMAL HIGH (ref 0.40–1.20)
GFR: 42.99 mL/min — ABNORMAL LOW (ref >60.00)
Glucose, Bld: 79 mg/dL (ref 70–99)
Potassium: 3.6 mEq/L (ref 3.5–5.1)
Sodium: 139 mEq/L (ref 135–145)

## 2023-01-23 LAB — MAGNESIUM: Magnesium: 2.2 mg/dL (ref 1.5–2.5)

## 2023-01-23 LAB — TSH: TSH: 0.78 u[IU]/mL (ref 0.35–5.50)

## 2023-01-23 NOTE — Progress Notes (Signed)
Subjective:  Patient ID: Rita Harrison, female    DOB: 1981/10/14  Age: 42 y.o. MRN: [id number]  CC: Headache and Hypertension   HPI Rita Harrison presents for f/up -   She complains of posterior neck pain that radiates up into her posterior scalp causing a headache.  She underwent a cervical MRI that showed protruding discs.  She denies paresthesias in her extremities.  She complains of a 3-week history of excessive fatigue and sleepiness throughout the day.  She tells me her blood pressure has been well-controlled.  Outpatient Medications Prior to Visit  Medication Sig Dispense Refill   ALPRAZolam (XANAX) 0.25 MG tablet Take 1 tablet (0.25 mg total) by mouth 2 (two) times daily as needed for anxiety. 20 tablet 3   Aluminum Chloride in Alcohol 6.25 % SOLN Apply 1 actuation topically at bedtime as directed 60 mL 3   amLODipine (NORVASC) 5 MG tablet Take 1 tablet (5 mg total) by mouth daily. 90 tablet 0   clindamycin (CLEOCIN T) 1 % lotion APPLY A THIN LAYER TO THE AFFECTED AREA(S) 2 TIMES PER DAY AS NEEDED 60 mL 1   cyclobenzaprine (FLEXERIL) 10 MG tablet Take 1 tablet (10 mg total) by mouth 3 (three) times daily as needed for muscle spasms. 90 tablet 5   Fluocinolone Acetonide Body (DERMA-SMOOTHE/FS BODY) 0.01 % OIL Apply topically at bedtime as directed. 118 mL 1   ketoconazole (NIZORAL) 2 % shampoo SHAMPOO SCALP, LET SIT 5 MINUTES, THEN RINSE OUT. 120 mL 2   nebivolol (BYSTOLIC) 10 MG tablet Take 1 tablet (10 mg total) by mouth daily. 90 tablet 3   omeprazole (PRILOSEC) 20 MG capsule Take 1 capsule (20 mg total) by mouth daily. 30 capsule 1   potassium chloride (KLOR-CON M) 10 MEQ tablet Take 1 tablet (10 mEq total) by mouth 2 (two) times daily. 180 tablet 1   topiramate (TOPAMAX) 100 MG tablet Take 1 tablet (100 mg total) by mouth at bedtime. 30 tablet 5   triamterene-hydrochlorothiazide (DYAZIDE) 37.5-25 MG capsule Take 1 capsule by mouth daily. 90 capsule 0   Ubrogepant  (UBRELVY) 100 MG TABS Take 100 mg (1 tablet) by mouth as needed for up to 1 dose (May repeat after 2 hours.  Maximum 2 tablets in 24 hours.). 8 tablet 5   benzonatate (TESSALON) 100 MG capsule Take 1 capsule (100 mg total) by mouth every 8 (eight) hours. (Patient not taking: Reported on 01/23/2023) 21 capsule 0   SUMAtriptan (IMITREX) 100 MG tablet Take 1 tablet (100 mg total) by mouth once as needed for up to 1 dose for migraine. May repeat in 2 hours if headache persists or recurs. (Patient not taking: Reported on 01/23/2023) 10 tablet 2   No facility-administered medications prior to visit.    ROS Review of Systems  Constitutional:  Positive for fatigue. Negative for appetite change, chills, diaphoresis, fever and unexpected weight change.  HENT: Negative.    Eyes: Negative.  Negative for visual disturbance.  Respiratory:  Negative for apnea, cough, shortness of breath and wheezing.   Cardiovascular:  Negative for chest pain, palpitations and leg swelling.  Gastrointestinal:  Negative for abdominal pain, diarrhea, nausea and vomiting.  Endocrine: Negative.  Negative for polyuria.  Genitourinary: Negative.  Negative for difficulty urinating.  Musculoskeletal:  Positive for neck pain. Negative for back pain.  Skin: Negative.   Neurological:  Positive for headaches. Negative for dizziness, weakness, light-headedness and numbness.  Hematological:  Negative for adenopathy. Does not bruise/bleed  easily.  Psychiatric/Behavioral: Negative.      Objective:  BP 136/86 (BP Location: Left Arm, Patient Position: Sitting, Cuff Size: Normal)   Pulse 71   Temp 98.6 F (37 C) (Oral)   Resp 16   Ht 5' 6"$  (1.676 m)   Wt 194 lb 12.8 oz (88.4 kg)   LMP 01/22/2023 (Exact Date)   SpO2 97%   BMI 31.44 kg/m   BP Readings from Last 3 Encounters:  01/23/23 136/86  12/21/22 (!) 137/98  12/13/22 110/71    Wt Readings from Last 3 Encounters:  01/23/23 194 lb 12.8 oz (88.4 kg)  12/21/22 195 lb 1.7 oz  (88.5 kg)  11/29/22 198 lb (89.8 kg)    Physical Exam Vitals reviewed.  HENT:     Nose: Nose normal.     Mouth/Throat:     Mouth: Mucous membranes are moist.  Eyes:     General: No scleral icterus.    Conjunctiva/sclera: Conjunctivae normal.  Cardiovascular:     Rate and Rhythm: Normal rate and regular rhythm.     Heart sounds: No murmur heard. Pulmonary:     Effort: Pulmonary effort is normal.     Breath sounds: No stridor. No wheezing, rhonchi or rales.  Abdominal:     General: Abdomen is flat.     Palpations: There is no mass.     Tenderness: There is no abdominal tenderness. There is no guarding.     Hernia: No hernia is present.  Musculoskeletal:        General: Normal range of motion.     Cervical back: Neck supple.     Right lower leg: No edema.     Left lower leg: No edema.  Lymphadenopathy:     Cervical: No cervical adenopathy.  Skin:    General: Skin is warm and dry.     Coloration: Skin is not pale.  Neurological:     General: No focal deficit present.     Mental Status: She is alert and oriented to person, place, and time. Mental status is at baseline.  Psychiatric:        Mood and Affect: Mood normal.        Behavior: Behavior normal.     Lab Results  Component Value Date   WBC 8.2 01/23/2023   HGB 13.9 01/23/2023   HCT 40.3 01/23/2023   PLT 304.0 01/23/2023   GLUCOSE 79 01/23/2023   CHOL 180 11/09/2020   TRIG 82.0 11/09/2020   HDL 41.30 11/09/2020   LDLCALC 122 (H) 11/09/2020   ALT 25 11/29/2022   AST 19 11/29/2022   NA 139 01/23/2023   K 3.6 01/23/2023   CL 104 01/23/2023   CREATININE 1.48 (H) 01/23/2023   BUN 17 01/23/2023   CO2 24 01/23/2023   TSH 0.78 01/23/2023   INR 1.08 04/23/2013   HGBA1C 5.6 02/21/2018    MR CERVICAL SPINE WO CONTRAST  Result Date: 01/12/2023 CLINICAL DATA:  Neck pain and bilateral finger numbness for the past 6 months. No injury or prior surgery. EXAM: MRI CERVICAL SPINE WITHOUT CONTRAST TECHNIQUE:  Multiplanar, multisequence MR imaging of the cervical spine was performed. No intravenous contrast was administered. COMPARISON:  CT cervical spine dated September 19, 2013. FINDINGS: Alignment: Unchanged mild reversal of the normal cervical lordosis. No listhesis. Vertebrae: No fracture, evidence of discitis, or bone lesion. Cord: Normal signal and morphology. Posterior Fossa, vertebral arteries, paraspinal tissues: Negative. Disc levels: C2-C3:  Negative. C3-C4:  Negative. C4-C5: Small left paracentral  disc protrusion contacting the left ventral cord. No stenosis. C5-C6: Small right paracentral disc protrusion contacting the right ventral cord. Mild bilateral uncovertebral hypertrophy. No stenosis. C6-C7:  Tiny shallow central disc protrusion.  No stenosis. C7-T1:  Negative disc.  Mild left facet arthropathy.  No stenosis. IMPRESSION: 1. Small disc protrusions at C4-C5 and C5-C6 contacting the ventral cord. No stenosis or impingement. Electronically Signed   By: Titus Dubin M.D.   On: 01/12/2023 15:58    Assessment & Plan:   Teea was seen today for headache and hypertension.  Diagnoses and all orders for this visit:  Primary hypertension- Her blood pressure is well-controlled.  She is not prerenal but has had a decline in her creatinine clearance.  I recommended that she undergo an ultrasound to evaluate the kidneys. -     CBC with Differential/Platelet; Future -     Basic metabolic panel; Future -     Magnesium; Future -     TSH; Future -     TSH -     Magnesium -     Basic metabolic panel -     CBC with Differential/Platelet -     US Renal; Future  Chronic hypokalemia -     Basic metabolic panel; Future -     Magnesium; Future -     Magnesium -     Basic metabolic panel  Protrusion of cervical intervertebral disc -     Ambulatory referral to Physical Medicine Rehab  Stage 3a chronic kidney disease ([place])- Will avoid nephrotoxic agents. -     US Renal; Future  Uncontrolled  daytime somnolence -     modafinil (PROVIGIL) 100 MG tablet; Take 1 tablet (100 mg total) by mouth daily.   I have discontinued Jennica L. Flannigan's SUMAtriptan and benzonatate. I am also having her start on modafinil. Additionally, I am having her maintain her Fluocinolone Acetonide Body, Aluminum Chloride in Alcohol, ketoconazole, nebivolol, clindamycin, omeprazole, cyclobenzaprine, triamterene-hydrochlorothiazide, ALPRAZolam, potassium chloride, topiramate, Ubrelvy, and amLODipine.  Meds ordered this encounter  Medications   modafinil (PROVIGIL) 100 MG tablet    Sig: Take 1 tablet (100 mg total) by mouth daily.    Dispense:  90 tablet    Refill:  0     Follow-up: Return in about 6 months (around 07/24/2023).  Scarlette Calico, MD

## 2023-01-23 NOTE — Patient Instructions (Signed)
Hypertension, Adult High blood pressure (hypertension) is when the force of blood pumping through the arteries is too strong. The arteries are the blood vessels that carry blood from the heart throughout the body. Hypertension forces the heart to work harder to pump blood and may cause arteries to become narrow or stiff. Untreated or uncontrolled hypertension can lead to a heart attack, heart failure, a stroke, kidney disease, and other problems. A blood pressure reading consists of a higher number over a lower number. Ideally, your blood pressure should be below 120/80. The first ("top") number is called the systolic pressure. It is a measure of the pressure in your arteries as your heart beats. The second ("bottom") number is called the diastolic pressure. It is a measure of the pressure in your arteries as the heart relaxes. What are the causes? The exact cause of this condition is not known. There are some conditions that result in high blood pressure. What increases the risk? Certain factors may make you more likely to develop high blood pressure. Some of these risk factors are under your control, including: Smoking. Not getting enough exercise or physical activity. Being overweight. Having too much fat, sugar, calories, or salt (sodium) in your diet. Drinking too much alcohol. Other risk factors include: Having a personal history of heart disease, diabetes, high cholesterol, or kidney disease. Stress. Having a family history of high blood pressure and high cholesterol. Having obstructive sleep apnea. Age. The risk increases with age. What are the signs or symptoms? High blood pressure may not cause symptoms. Very high blood pressure (hypertensive crisis) may cause: Headache. Fast or irregular heartbeats (palpitations). Shortness of breath. Nosebleed. Nausea and vomiting. Vision changes. Severe chest pain, dizziness, and seizures. How is this diagnosed? This condition is diagnosed by  measuring your blood pressure while you are seated, with your arm resting on a flat surface, your legs uncrossed, and your feet flat on the floor. The cuff of the blood pressure monitor will be placed directly against the skin of your upper arm at the level of your heart. Blood pressure should be measured at least twice using the same arm. Certain conditions can cause a difference in blood pressure between your right and left arms. If you have a high blood pressure reading during one visit or you have normal blood pressure with other risk factors, you may be asked to: Return on a different day to have your blood pressure checked again. Monitor your blood pressure at home for 1 week or longer. If you are diagnosed with hypertension, you may have other blood or imaging tests to help your health care provider understand your overall risk for other conditions. How is this treated? This condition is treated by making healthy lifestyle changes, such as eating healthy foods, exercising more, and reducing your alcohol intake. You may be referred for counseling on a healthy diet and physical activity. Your health care provider may prescribe medicine if lifestyle changes are not enough to get your blood pressure under control and if: Your systolic blood pressure is above 130. Your diastolic blood pressure is above 80. Your personal target blood pressure may vary depending on your medical conditions, your age, and other factors. Follow these instructions at home: Eating and drinking  Eat a diet that is high in fiber and potassium, and low in sodium, added sugar, and fat. An example of this eating plan is called the DASH diet. DASH stands for Dietary Approaches to Stop Hypertension. To eat this way: Eat   plenty of fresh fruits and vegetables. Try to fill one half of your plate at each meal with fruits and vegetables. Eat whole grains, such as whole-wheat pasta, brown rice, or whole-grain bread. Fill about one  fourth of your plate with whole grains. Eat or drink low-fat dairy products, such as skim milk or low-fat yogurt. Avoid fatty cuts of meat, processed or cured meats, and poultry with skin. Fill about one fourth of your plate with lean proteins, such as fish, chicken without skin, beans, eggs, or tofu. Avoid pre-made and processed foods. These tend to be higher in sodium, added sugar, and fat. Reduce your daily sodium intake. Many people with hypertension should eat less than 1,500 mg of sodium a day. Do not drink alcohol if: Your health care provider tells you not to drink. You are pregnant, may be pregnant, or are planning to become pregnant. If you drink alcohol: Limit how much you have to: 0-1 drink a day for women. 0-2 drinks a day for men. Know how much alcohol is in your drink. In the [place] one drink equals one 12 oz bottle of beer (355 mL), one 5 oz glass of wine (148 mL), or one 1 oz glass of hard liquor (44 mL). Lifestyle  Work with your health care provider to maintain a healthy body weight or to lose weight. Ask what an ideal weight is for you. Get at least 30 minutes of exercise that causes your heart to beat faster (aerobic exercise) most days of the week. Activities may include walking, swimming, or biking. Include exercise to strengthen your muscles (resistance exercise), such as Pilates or lifting weights, as part of your weekly exercise routine. Try to do these types of exercises for 30 minutes at least 3 days a week. Do not use any products that contain nicotine or tobacco. These products include cigarettes, chewing tobacco, and vaping devices, such as e-cigarettes. If you need help quitting, ask your health care provider. Monitor your blood pressure at home as told by your health care provider. Keep all follow-up visits. This is important. Medicines Take over-the-counter and prescription medicines only as told by your health care provider. Follow directions carefully. Blood  pressure medicines must be taken as prescribed. Do not skip doses of blood pressure medicine. Doing this puts you at risk for problems and can make the medicine less effective. Ask your health care provider about side effects or reactions to medicines that you should watch for. Contact a health care provider if you: Think you are having a reaction to a medicine you are taking. Have headaches that keep coming back (recurring). Feel dizzy. Have swelling in your ankles. Have trouble with your vision. Get help right away if you: Develop a severe headache or confusion. Have unusual weakness or numbness. Feel faint. Have severe pain in your chest or abdomen. Vomit repeatedly. Have trouble breathing. These symptoms may be an emergency. Get help right away. Call 911. Do not wait to see if the symptoms will go away. Do not drive yourself to the hospital. Summary Hypertension is when the force of blood pumping through your arteries is too strong. If this condition is not controlled, it may put you at risk for serious complications. Your personal target blood pressure may vary depending on your medical conditions, your age, and other factors. For most people, a normal blood pressure is less than 120/80. Hypertension is treated with lifestyle changes, medicines, or a combination of both. Lifestyle changes include losing weight, eating a healthy,   low-sodium diet, exercising more, and limiting alcohol. This information is not intended to replace advice given to you by your health care provider. Make sure you discuss any questions you have with your health care provider. Document Revised: 10/11/2021 Document Reviewed: 10/11/2021 Elsevier Patient Education  2023 [employer]  

## 2023-01-24 ENCOUNTER — Encounter: Payer: Self-pay | Admitting: Internal Medicine

## 2023-01-25 ENCOUNTER — Encounter: Payer: Self-pay | Admitting: Neurology

## 2023-01-25 ENCOUNTER — Ambulatory Visit: Payer: 59 | Admitting: Neurology

## 2023-01-27 ENCOUNTER — Other Ambulatory Visit (HOSPITAL_COMMUNITY): Payer: Self-pay

## 2023-01-27 DIAGNOSIS — R400 Somnolence: Secondary | ICD-10-CM | POA: Insufficient documentation

## 2023-01-27 MED ORDER — MODAFINIL 100 MG PO TABS
100.0000 mg | ORAL_TABLET | Freq: Every day | ORAL | 0 refills | Status: DC
  Filled 2023-01-27: qty 90, 90d supply, fill #0

## 2023-01-29 ENCOUNTER — Other Ambulatory Visit: Payer: Self-pay

## 2023-01-29 ENCOUNTER — Other Ambulatory Visit: Payer: Self-pay | Admitting: Internal Medicine

## 2023-01-29 ENCOUNTER — Other Ambulatory Visit (HOSPITAL_BASED_OUTPATIENT_CLINIC_OR_DEPARTMENT_OTHER): Payer: Self-pay

## 2023-01-29 ENCOUNTER — Other Ambulatory Visit (HOSPITAL_COMMUNITY): Payer: Self-pay

## 2023-01-29 DIAGNOSIS — I10 Essential (primary) hypertension: Secondary | ICD-10-CM

## 2023-01-29 MED ORDER — TRIAMTERENE-HCTZ 37.5-25 MG PO CAPS
1.0000 | ORAL_CAPSULE | Freq: Every day | ORAL | 0 refills | Status: DC
  Filled 2023-01-29 (×2): qty 90, 90d supply, fill #0

## 2023-01-30 ENCOUNTER — Other Ambulatory Visit: Payer: Self-pay

## 2023-01-30 ENCOUNTER — Other Ambulatory Visit (HOSPITAL_COMMUNITY): Payer: Self-pay

## 2023-01-31 ENCOUNTER — Other Ambulatory Visit (HOSPITAL_COMMUNITY): Payer: Self-pay

## 2023-02-01 ENCOUNTER — Other Ambulatory Visit (HOSPITAL_COMMUNITY): Payer: Self-pay

## 2023-02-03 ENCOUNTER — Ambulatory Visit (HOSPITAL_BASED_OUTPATIENT_CLINIC_OR_DEPARTMENT_OTHER)
Admission: RE | Admit: 2023-02-03 | Discharge: 2023-02-03 | Disposition: A | Discharge status: Home / Self Care | Payer: 59 | Source: Ambulatory Visit | Attending: Internal Medicine | Admitting: Internal Medicine

## 2023-02-03 DIAGNOSIS — I10 Essential (primary) hypertension: Secondary | ICD-10-CM | POA: Insufficient documentation

## 2023-02-03 DIAGNOSIS — N1831 Chronic kidney disease, stage 3a: Secondary | ICD-10-CM | POA: Diagnosis not present

## 2023-02-03 DIAGNOSIS — N189 Chronic kidney disease, unspecified: Secondary | ICD-10-CM | POA: Diagnosis not present

## 2023-02-05 ENCOUNTER — Encounter: Payer: 59 | Attending: Physical Medicine and Rehabilitation | Admitting: Physical Medicine and Rehabilitation

## 2023-02-05 ENCOUNTER — Encounter: Payer: Self-pay | Admitting: Physical Medicine and Rehabilitation

## 2023-02-05 ENCOUNTER — Other Ambulatory Visit (HOSPITAL_COMMUNITY): Payer: Self-pay

## 2023-02-05 DIAGNOSIS — M542 Cervicalgia: Secondary | ICD-10-CM | POA: Diagnosis not present

## 2023-02-05 DIAGNOSIS — M5020 Other cervical disc displacement, unspecified cervical region: Secondary | ICD-10-CM | POA: Diagnosis not present

## 2023-02-05 DIAGNOSIS — M5481 Occipital neuralgia: Secondary | ICD-10-CM | POA: Diagnosis not present

## 2023-02-05 DIAGNOSIS — G44021 Chronic cluster headache, intractable: Secondary | ICD-10-CM | POA: Diagnosis not present

## 2023-02-05 MED ORDER — GABAPENTIN 100 MG PO CAPS
100.0000 mg | ORAL_CAPSULE | Freq: Every day | ORAL | 1 refills | Status: DC
  Filled 2023-02-05: qty 90, 30d supply, fill #0
  Filled 2023-04-03: qty 90, 30d supply, fill #1

## 2023-02-05 NOTE — Progress Notes (Unsigned)
Subjective:    Patient ID: Rita Harrison, female    DOB: October 30, 1981, 42 y.o.   MRN: [id number]  HPI   Pain Inventory Average Pain 8 Pain Right Now 7 My pain is intermittent, sharp, and stabbing  In the last 24 hours, has pain interfered with the following? General activity 8 Relation with others 8 Enjoyment of life 8 What TIME of day is your pain at its worst? morning , evening, and night Sleep (in general) Fair  Pain is worse with: bending, sitting, and standing Pain improves with: rest and ice Relief from Meds: 3  walk without assistance ability to climb steps?  yes do you drive?  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  yes Renal Scan in [place]  Any changes since last visit?  no  HPI  Rita Harrison is a 42 y.o. year old female  who  has a past medical history of Anxiety, Arm DVT (deep venous thromboembolism), chronic ([place]), Chicken pox, Encounter for general adult medical examination with abnormal findings (11/09/2020), Hypertension, and Sickle cell anemia ([place]).   They are presenting to PM&R clinic as a new patient for pain management evaluation. They were referred by Dr. Scarlette Calico for treatment of cervical pain.   Source: Mid-neck, extending into R side and into the top of her head, behind her right eye. Headaches are daily.  Inciting incident: Has been present since last August; states it is because she is typing with poor ergonomics at work (work at [employer] [facility] [employer]) Exacerbating factors:  typing at her desk, bending forward, and standing Remitting factors: relaxation and ice Red flag symptoms:  numbness ant tingling in her fingers at nighttime, bilaterally;  and No red flags for back pain endorsed in Hx or ROS  Medications tried: Topical medications (never tried) :  Nsaids (no effect) : was taking ibuprofen daily; stopped d/t risk  rebound HAs Tylenol  (no effect) : was taking tylenol daily; stopped d/t risk rebound HAs Opiates  ( for different indication ) : Percocet 5 mg last 04/2021 - post-op Gabapentin / Lyrica  (never tried) :  TCAs  (never tried) :  SNRIs  (never tried) :  Other  (no effect) : Has been getting a migraine workup; MRI head WNL, MRI spine with Small disc protrusions at C4-C5 and C5-C6 contacting the ventral. Is on sumaptriptan and Ubrelvy for rescue, none have any effect.   On cyclobenzaprine QHS, uses with ice at nighttime to help with sleep.   Recently stopped Topomax d/t drowsiness.   Other treatments: PT/OT  (mild effect) : referred to but couldn't attend d/t insurance change; has been doing neck stretches and to the gym weekly Accupuncture/chiropractor/massage  (mild effect) : [facility] to [facility] but they didn't do it, they wanted an MRI first; gets occassional massages with benefit TENs unit (never tried) :  Injections (never tried) :  Surgery (never tried) :  Other  (moderate effect) : Ice on neck  Goals for pain control: ***  Prior UDS results: No results found for: "LABOPIA", "COCAINSCRNUR", "LABBENZ", "AMPHETMU", "THCU", "LABBARB"   ROS: Review of Systems  Gastrointestinal:  Positive for nausea.  Neurological:  Positive for weakness and numbness.       Tingling, spasms  Psychiatric/Behavioral:  Positive for confusion.        Anxiety  All other systems reviewed and  are negative.     Family History  Problem Relation Age of Onset   Sickle cell anemia Father    Kidney cancer Father    Migraines Sister    Breast cancer Maternal Grandmother    Social History   Socioeconomic History   Marital status: Single    Spouse name: Not on file   Number of children: 1   Years of education: 13   Highest education level: Not on file  Occupational History   Occupation: CNA  Tobacco Use   Smoking status: Former    Packs/day: 0.50    Years: 10.00    Total pack  years: 5.00    Types: Cigarettes   Smokeless tobacco: Never  Vaping Use   Vaping Use: Former  Substance and Sexual Activity   Alcohol use: Not Currently    Comment: occasionally   Drug use: Not Currently    Types: Marijuana    Comment: occasionally   Sexual activity: Yes    Partners: Male    Birth control/protection: Condom  Other Topics Concern   Not on file  Social History Narrative   Fun: Travel, family, son's football games    Denies religious beliefs effecting health care   Feels safe at home and denies abuse.    Right handed   No caffeine   One story home   Social Determinants of Health   Financial Resource Strain: Low Risk  (10/05/2022)   Overall Financial Resource Strain (CARDIA)    Difficulty of Paying Living Expenses: Not hard at all  Food Insecurity: No Food Insecurity (10/05/2022)   Hunger Vital Sign    Worried About Running Out of Food in the Last Year: Never true    Ran Out of Food in the Last Year: Never true  Transportation Needs: No Transportation Needs (10/05/2022)   PRAPARE - Hydrologist (Medical): No    Lack of Transportation (Non-Medical): No  Physical Activity: Insufficiently Active (10/05/2022)   Exercise Vital Sign    Days of Exercise per Week: 7 days    Minutes of Exercise per Session: 10 min  Stress: Stress Concern Present (10/05/2022)   [place]    Feeling of Stress : To some extent  Social Connections: Not on file   History reviewed. No pertinent surgical history. Past Medical History:  Diagnosis Date   Anxiety    Arm DVT (deep venous thromboembolism), chronic (HCC)    RUE   Chicken pox    Hypertension    Sickle cell anemia (HCC)    sickle cell trait   LMP 01/22/2023 (Exact Date)   Opioid Risk Score:   Fall Risk Score:  `1  Depression screen PHQ 2/9     01/23/2023    1:58 PM 05/17/2022    1:46 PM 12/01/2021   11:08 AM 12/01/2021    11:07 AM 11/09/2020    3:56 PM 02/21/2018   10:05 AM  Depression screen PHQ 2/9  Decreased Interest 0 0 0 0 0 0  Down, Depressed, Hopeless 0 1 1 1 $ 0 0  PHQ - 2 Score 0 1 1 1 $ 0 0  Altered sleeping  0 0     Tired, decreased energy  0 0     Change in appetite  0 1     Feeling bad or failure about yourself   0 0     Trouble concentrating  2 0     Moving  slowly or fidgety/restless  3 0     Suicidal thoughts  0 0     PHQ-9 Score  6 2     Difficult doing work/chores  Not difficult at all        Review of Systems  Gastrointestinal:  Positive for nausea.  Neurological:  Positive for weakness and numbness.       Tingling, spasms  Psychiatric/Behavioral:  Positive for confusion.        Anxiety  All other systems reviewed and are negative.      Objective:   Physical Exam  PE: Constitution: Appropriate appearance for age. No apparent distress *** +Obese HEENT: PERRL, EOMI grossly intact.  Resp: CTAB. No rales, rhonchi, or wheezing. Cardio: RRR. No mumurs, rubs, or gallops. *** No peripheral edema. Abdomen: Nondistended. Nontender. +bowel sounds. Psych: Appropriate mood and affect. Neuro: AAOx4. No apparent deficits ***  Neurologic Exam:   DTRs: Reflexes were 2+ in bilateral achilles, patella, biceps, BR and triceps. Babinsky: flexor responses b/l.   Hoffmans: negative b/l Sensory exam: revealed normal sensation in all dermatomal regions in {Blank multiple:19196::"***","bilateral upper extremities","bilateral lower extremities","right upper extremity","left upper extremity","right lower extremity","left lower extremity","with reduced sensation to light touch in ***"} Motor exam: strength 5/5 throughout {Blank multiple:19196::"***","bilateral upper extremities","bilateral lower extremities","right upper extremity","left upper extremity","right lower extremity","left lower extremity","with exception of ***"} Coordination: Fine motor coordination was normal.   Gait: {Blank  single:19197::"not observed due to safety concerns","normal","+trendelenberg","+antalgic gait","+foot drop","***"}        Assessment & Plan:   Assessment and Plan: KIMMI SCARDINO is a 42 y.o. year old female  who  has a past medical history of Anxiety, Arm DVT (deep venous thromboembolism), chronic ([place]), Chicken pox, Encounter for general adult medical examination with abnormal findings (11/09/2020), Hypertension, and Sickle cell anemia ([place]).   They are presenting to PM&R clinic as a new patient for treatment of *** pain.   There are no diagnoses linked to this encounter.

## 2023-02-05 NOTE — Patient Instructions (Addendum)
Start gabapentin 100 mg. Start with 1 capsule nightly; after 1 week, can increase to 2-3 capsules nightly. Call me if any side effects or if no effect.   I have referred you to PT for neck range of motion, stretching, massage and development of home exercises.   See me in 1-2 weeks for an occipital nerve block  See me for general follow up in 2 months  I have given you a handout on good desk ergonomics and a letter for your employer.

## 2023-02-05 NOTE — Progress Notes (Deleted)
Subjective:    Patient ID: Rita Harrison, female    DOB: [birth year], 42 y.o.   MRN: [id number]  HPI   Pain Inventory Average Pain {NUMBERS; 0-10:5044} Pain Right Now {NUMBERS; 0-10:5044} My pain is {PAIN DESCRIPTION:21022940}  In the last 24 hours, has pain interfered with the following? General activity {NUMBERS; 0-10:5044} Relation with others {NUMBERS; 0-10:5044} Enjoyment of life {NUMBERS; 0-10:5044} What TIME of day is your pain at its worst? {time of day:24191} Sleep (in general) {BHH GOOD/FAIR/POOR:22877}  Pain is worse with: {ACTIVITIES:21022942} Pain improves with: {PAIN IMPROVES BW:[id number] Relief from Meds: {NUMBERS; 0-10:5044}  Family History  Problem Relation Age of Onset   Sickle cell anemia Father    Kidney cancer Father    Migraines Sister    Breast cancer Maternal Grandmother    Social History   Socioeconomic History   Marital status: Single    Spouse name: Not on file   Number of children: 1   Years of education: 13   Highest education level: Not on file  Occupational History   Occupation: CNA  Tobacco Use   Smoking status: Former    Packs/day: 0.50    Years: 10.00    Total pack years: 5.00    Types: Cigarettes   Smokeless tobacco: Never  Vaping Use   Vaping Use: Former  Substance and Sexual Activity   Alcohol use: Not Currently    Comment: occasionally   Drug use: Yes    Types: Marijuana    Comment: occasionally   Sexual activity: Yes    Partners: Male    Birth control/protection: Condom  Other Topics Concern   Not on file  Social History Narrative   Fun: Travel, family, son's football games    Denies religious beliefs effecting health care   Feels safe at home and denies abuse.    Right handed   No caffeine   One story home   Social Determinants of Health   Financial Resource Strain: Low Risk  (10/05/2022)   Overall Financial Resource Strain (CARDIA)    Difficulty of Paying Living Expenses: Not hard at all  Food  Insecurity: No Food Insecurity (10/05/2022)   Hunger Vital Sign    Worried About Running Out of Food in the Last Year: Never true    Ran Out of Food in the Last Year: Never true  Transportation Needs: No Transportation Needs (10/05/2022)   PRAPARE - Hydrologist (Medical): No    Lack of Transportation (Non-Medical): No  Physical Activity: Insufficiently Active (10/05/2022)   Exercise Vital Sign    Days of Exercise per Week: 7 days    Minutes of Exercise per Session: 10 min  Stress: Stress Concern Present (10/05/2022)   [place]    Feeling of Stress : To some extent  Social Connections: Not on file   No past surgical history on file. No past surgical history on file. Past Medical History:  Diagnosis Date   Anxiety    Arm DVT (deep venous thromboembolism), chronic (HCC)    RUE   Chicken pox    Hypertension    Sickle cell anemia ([facility]CC)    sickle cell trait   LMP 01/22/2023 (Exact Date)   Opioid Risk Score:   Fall Risk Score:  `1  Depression screen PHQ 2/9     01/23/2023    1:58 PM 05/17/2022    1:46 PM 12/01/2021   11:08 AM 12/01/2021  11:07 AM 11/09/2020    3:56 PM 02/21/2018   10:05 AM  Depression screen PHQ 2/9  Decreased Interest 0 0 0 0 0 0  Down, Depressed, Hopeless 0 1 1 1 $ 0 0  PHQ - 2 Score 0 1 1 1 $ 0 0  Altered sleeping  0 0     Tired, decreased energy  0 0     Change in appetite  0 1     Feeling bad or failure about yourself   0 0     Trouble concentrating  2 0     Moving slowly or fidgety/restless  3 0     Suicidal thoughts  0 0     PHQ-9 Score  6 2     Difficult doing work/chores  Not difficult at all        Review of Systems     Objective:   Physical Exam        Assessment & Plan:

## 2023-02-06 DIAGNOSIS — M542 Cervicalgia: Secondary | ICD-10-CM | POA: Insufficient documentation

## 2023-02-06 DIAGNOSIS — M5481 Occipital neuralgia: Secondary | ICD-10-CM | POA: Insufficient documentation

## 2023-02-06 NOTE — Assessment & Plan Note (Signed)
I have referred you to PT for neck range of motion, stretching, massage and development of home exercises.   See me for general follow up in 2 months

## 2023-02-06 NOTE — Assessment & Plan Note (Signed)
MRI C spine 01/12/23 showing Small disc protrusions at C4-C5 and C5-C6 contacting the ventral cord. No stenosis or impingement.  Likely contributing to   cervicalgia, and headaches. Discussed with patient how these symptoms may be related d/t tight fascial connections around greater occipital nerve.    I have given you a handout on good desk ergonomics and a letter for your employer for height adjusting desk.

## 2023-02-06 NOTE — Assessment & Plan Note (Signed)
Start gabapentin 100 mg. Start with 1 capsule nightly; after 1 week, can increase to 2-3 capsules nightly. Call me if any side effects or if no effect.   See me in 1-2 weeks for an occipital nerve block

## 2023-02-13 ENCOUNTER — Other Ambulatory Visit (HOSPITAL_COMMUNITY): Payer: Self-pay

## 2023-02-16 ENCOUNTER — Encounter (HOSPITAL_BASED_OUTPATIENT_CLINIC_OR_DEPARTMENT_OTHER): Payer: Self-pay

## 2023-02-16 ENCOUNTER — Telehealth: Payer: Self-pay | Admitting: *Deleted

## 2023-02-16 NOTE — Telephone Encounter (Signed)
Pt. Equipment previously returned!

## 2023-02-16 NOTE — Telephone Encounter (Signed)
Secure chat message sent to Rita Harrison to contact the patient to follow up on itamar that has not been done that was given to her in October 2023.

## 2023-02-28 ENCOUNTER — Ambulatory Visit: Payer: 59 | Admitting: Neurology

## 2023-03-12 ENCOUNTER — Encounter: Payer: 59 | Attending: Physical Medicine and Rehabilitation | Admitting: Physical Medicine and Rehabilitation

## 2023-03-12 ENCOUNTER — Ambulatory Visit: Payer: 59 | Attending: Physical Medicine and Rehabilitation | Admitting: Physical Therapy

## 2023-03-12 VITALS — BP 138/83 | HR 74 | Ht 66.0 in | Wt 201.0 lb

## 2023-03-12 DIAGNOSIS — M5412 Radiculopathy, cervical region: Secondary | ICD-10-CM | POA: Diagnosis not present

## 2023-03-12 DIAGNOSIS — M5020 Other cervical disc displacement, unspecified cervical region: Secondary | ICD-10-CM | POA: Insufficient documentation

## 2023-03-12 DIAGNOSIS — M6281 Muscle weakness (generalized): Secondary | ICD-10-CM | POA: Diagnosis not present

## 2023-03-12 DIAGNOSIS — M542 Cervicalgia: Secondary | ICD-10-CM | POA: Insufficient documentation

## 2023-03-12 DIAGNOSIS — M5481 Occipital neuralgia: Secondary | ICD-10-CM | POA: Diagnosis not present

## 2023-03-12 DIAGNOSIS — G44021 Chronic cluster headache, intractable: Secondary | ICD-10-CM | POA: Diagnosis not present

## 2023-03-12 MED ORDER — LIDOCAINE HCL 1 % IJ SOLN
3.0000 mL | Freq: Once | INTRAMUSCULAR | Status: AC
  Administered 2023-03-12: 3 mL

## 2023-03-12 MED ORDER — TRIAMCINOLONE ACETONIDE 40 MG/ML IJ SUSP
10.0000 mg | Freq: Once | INTRAMUSCULAR | Status: AC
  Administered 2023-03-12: 10 mg via INTRAMUSCULAR

## 2023-03-12 NOTE — Progress Notes (Signed)
HPI: Rita Harrison is a 42 y.o. female with PMHx has Encounter for general adult medical examination with abnormal findings; Vitamin D deficiency disease; Hyperlipidemia with target LDL less than 130; Hammer toes, bilateral; Seborrheic dermatitis of scalp; Gastroesophageal reflux disease; Intractable hemiplegic migraine with status migrainosus; Primary hypertension; Chronic hypokalemia; Protrusion of cervical intervertebral disc; Stage 3a chronic kidney disease ([place]); Uncontrolled daytime somnolence; Occipital neuralgia of right side; and Cervicalgia on their problem list. who presents to clinic for treatment of pain related to headaches  via injection as described below.    No new concerns or complaints. No major changes in medical history since last visit.   Physical Exam:  General: Appropriate appearance for age.  Mental Status: Appropriate mood and affect.  Cardiovascular: RRR, no m/r/g.  Respiratory: CTAB, no rales/rhonchi/wheezing.  Skin: No apparent rashes or lesions.  Neuro: Awake, alert, and oriented x3. No apparent deficits.  MSK:  Moving all 4 limbs antigravity and against resistance.  +TTP over right occipital nerve just medial to occipital artery.   PROCEDURE:  Right   occipital nerve block Diagnosis:    ICD-10-CM   1. Occipital neuralgia of right side  M54.81 lidocaine (XYLOCAINE) 1 % (with pres) injection 3 mL    triamcinolone acetonide (KENALOG-40) injection 10 mg      Goals with treatment: [ x ] Decrease pain [  ] Improve Active / Passive ROM [ x ] Improve ADLs [  ] Improve functional mobility  MEDICATION:  [ x ] Kenalog 40 mg/mL  [ X ] Lidocaine 1%    CONSENT: Obtained in writing per policy. Consent uploaded to chart.  Benefits discussed.  Risks discussed included, but were not limited to, pain and discomfort, bleeding, bruising, allergic reaction, infection. All questions answered to patient/family member/guardian/ caregiver satisfaction. They would like to  proceed with procedure. There are no noted contraindications to procedure.  PROCEDURE Time out was preformed No heat sources No antibiotics  The patient was explained about both the benefits and risks of a Right   occipital nerve block . After the patient acknowledged an understanding of the risks and benefits, the patient agreed to proceed. The area was first marked and then prepped in an aseptic fashion with betadine / alcohol. A 30 g, 1/2 inch needle was directed via a posterior approach into the Right posterior head just medial to the occipital artery.  The injection was completed with Kenalog 40 mg/ml 0.2 cc mixed with 3 cc of 1% lidocaine after no blood was aspirated on pull back.  No complications were encountered. The patient tolerated the procedure well.  Impression: HPI: Rita Harrison is a 42 y.o. female with PMHx has Encounter for general adult medical examination with abnormal findings; Vitamin D deficiency disease; Hyperlipidemia with target LDL less than 130; Hammer toes, bilateral; Seborrheic dermatitis of scalp; Gastroesophageal reflux disease; Intractable hemiplegic migraine with status migrainosus; Primary hypertension; Chronic hypokalemia; Protrusion of cervical intervertebral disc; Stage 3a chronic kidney disease ([name]); Uncontrolled daytime somnolence; Occipital neuralgia of right side; and Cervicalgia on their problem list. who presents to clinic for treatment of R sided headache pain . They received a  Right   occipital nerve block  as above.   PLAN: - Resume Usual Activities. Notify Physician of any unusual bleeding, erythema or concern for side effects as reviewed above. - Apply ice prn for pain - Tylenol prn for pain - Apply voltaren gel to the neck up to 4x daily to help with headaches, neck pain. -  Continue gabapentin 300 mg at nighttime until follow up.   - Follow up as scheduled  Patient/Care Giver was ready to learn without apparent learning barriers. Education  was provided on diagnosis, treatment options/plan according to patient's preferred learning style. Patient/Care Giver verbalized understanding and agreement with the above plan.   Rita Gowda, DO 03/12/2023

## 2023-03-12 NOTE — Therapy (Signed)
OUTPATIENT PHYSICAL THERAPY CERVICAL EVALUATION   Patient Name: Rita Harrison MRN: [id number] DOB:06-Aug-1981, 42 y.o., female Today's Date: 03/12/2023  END OF SESSION:  PT End of Session - 03/12/23 1108     Visit Number 1    Number of Visits 5   with eval   Date for PT Re-Evaluation 04/23/23    Authorization Type [employer])    PT Start Time 1107   pt arrived late   PT Stop Time 1145    PT Time Calculation (min) 38 min    Activity Tolerance Patient tolerated treatment well    Behavior During Therapy WFL for tasks assessed/performed             Past Medical History:  Diagnosis Date   Anxiety    Arm DVT (deep venous thromboembolism), chronic ([place])    RUE   Chicken pox    Encounter for general adult medical examination with abnormal findings 11/09/2020   Hypertension    Sickle cell anemia (HCC)    sickle cell trait   No past surgical history on file. Patient Active Problem List   Diagnosis Date Noted   Occipital neuralgia of right side 02/06/2023   Cervicalgia 02/06/2023   Uncontrolled daytime somnolence 01/27/2023   Protrusion of cervical intervertebral disc 01/23/2023   Stage 3a chronic kidney disease ([place]) 01/23/2023   Chronic hypokalemia 11/29/2022   Intractable hemiplegic migraine with status migrainosus 10/16/2022   Primary hypertension 10/16/2022   Gastroesophageal reflux disease 09/08/2022   Seborrheic dermatitis of scalp 03/02/2022   Hyperlipidemia with target LDL less than 130 11/19/2020   Vitamin D deficiency disease 11/11/2020   Encounter for general adult medical examination with abnormal findings 11/09/2020   Hammer toes, bilateral 03/14/2019    PCP: Janith Lima, MD  REFERRING PROVIDER: Gertie Gowda, DO  REFERRING DIAG: [phone] (ICD-10-CM) - Occipital neuralgia of right side M54.2 (ICD-10-CM) - Cervicalgia G44.021 (ICD-10-CM) - Intractable chronic cluster headache M50.20 (ICD-10-CM) - Protrusion of cervical  intervertebral disc  THERAPY DIAG:  Muscle weakness (generalized)  Bilateral occipital neuralgia  Cervicalgia  Radiculopathy, cervical region  Rationale for Evaluation and Treatment: Rehabilitation  ONSET DATE: 02/05/2023 (referall); symptoms started in August 2023  SUBJECTIVE:                                                                                                                                                                                                         SUBJECTIVE STATEMENT: Pt reports she has been having headaches since August, has them every day. These headaches  come from the R side of her head up to above her R eye. Pt did see a neurologist who took her off of OTC pain medications, caffeine, etc. and then she had an MRI done of her brain which was clear. Pt reports that the neurologist put her on medications but it made her sick and she felt foggy and she still continued to have the headaches. Pt reports she went to see an acupuncturist but was told she needed a cervical MRI. Pt unable to return to acupuncturist due to insurance changes but did have the cervical MRI. Pt reports that her PCP told her results of cervical MRI show that she has a bulging disc in her neck. Pt then went to see Dr. Tressa Busman who diagnosed her with occipital neuralgia. Pt going to see Dr. Tressa Busman later this afternoon for an injection. Pt did have an ergonomic assessment done at work and it was determined that her desk setup was not ideal. Emailed pt link for adjustable desks utilized in this clinic. Pt also reports that initially her pain was just in her R side but now on both sides as well as onset of blurry vision in her L eye.  Hand dominance: Right  PERTINENT HISTORY:  anxiety, arm DVT, chicken pox, hypertension, and sickle cell anemia  PAIN:  Are you having pain? Yes: NPRS scale: 5/10 Pain location: base of R side of neck, up side of head, into center of forehead Pain description:  dull Aggravating factors: standing and bending over to type at work Relieving factors: ibuprofen helps, Tylenol wears off fast  PRECAUTIONS: None  WEIGHT BEARING RESTRICTIONS: No  FALLS:  Has patient fallen in last 6 months? 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 Tressa Busman 03/12/23  OBJECTIVE:   DIAGNOSTIC FINDINGS:  Cervical Spine MRI 01/12/2023 IMPRESSION: 1. Small disc protrusions at C4-C5 and C5-C6 contacting the ventral cord. No stenosis or impingement.  PATIENT SURVEYS:  Headache Disability Index: 52 points  COGNITION: Overall cognitive status: Within functional limits for tasks assessed  SENSATION: Can have N/T down into arms, usually sleeps on L side so has numbness down this side Light touch sensation WFL No numbness with median, ulnar, or radial nerve tensioners  POSTURE: rounded shoulders and forward head  PALPATION: TTP in B upper traps and paraspinals   CERVICAL ROM:   Active ROM A/PROM (deg) Eval 03/12/23  Flexion 30  Extension 40 (pain/tight L side of neck)  Right lateral flexion 45  (pain in L side of neck)  Left lateral flexion 45  (pain in R side, more than on L side)  Right rotation 35  Left rotation 30 (pain/tight on R side of neck)   (Blank rows = not tested)  UPPER EXTREMITY ROM:  Active ROM Right eval Left eval  Shoulder flexion [facility]  Shoulder extension    Shoulder abduction [facility] WFL (feels tightness in back of shoulder)  Shoulder adduction    Shoulder extension    Shoulder internal rotation    Shoulder external rotation    Elbow flexion [facility] WFL  Elbow extension [facility]  Wrist flexion    Wrist extension     Wrist ulnar deviation  Wrist radial deviation    Wrist pronation    Wrist supination     (Blank rows = not tested)  UPPER EXTREMITY MMT:  MMT Right eval Left eval  Shoulder flexion 5 5  Shoulder extension    Shoulder abduction 4 4 (pain in back of shoulder)  Shoulder adduction    Shoulder extension    Shoulder internal rotation    Shoulder external rotation    Middle trapezius    Lower trapezius    Elbow flexion 5 5  Elbow extension 5 5  Wrist flexion    Wrist extension    Wrist ulnar deviation    Wrist radial deviation    Wrist pronation    Wrist supination    Grip strength     (Blank rows = not tested)  CERVICAL SPECIAL TESTS:  Upper limb tension test (ULTT): Negative and Distraction test: Positive   TODAY'S TREATMENT:                                                                                                                              Initiated seated stretches: Bilateral UT stretch 3 x 30 sec each Bilateral levator scap stretch 3 x 30 sec each  Added to HEP, see bolded below   PATIENT EDUCATION:  Education details: Eval findings, PT POC, initiated HEP Person educated: Patient Education method: Explanation, Demonstration, and Handouts Education comprehension: verbalized understanding, returned demonstration, and needs further education  HOME EXERCISE PROGRAM: Access Code: [email or web address] URL: [email or web address] Date: 03/12/2023 Prepared by: Excell Seltzer  Exercises - Seated Upper Trapezius Stretch  - 1 x daily - 7 x weekly - 1 sets - 5-10 reps - 30 sec hold - Gentle Levator Scapulae Stretch  - 1 x daily - 7 x weekly - 1 sets - 04-1009 reps - 30 sec hold  ASSESSMENT:  CLINICAL IMPRESSION: Patient is a 42 year old female referred to Neuro OPPT for occipital neuralgia.   Pt's PMH is significant for: anxiety, arm DVT, chicken pox, hypertension, and sickle cell anemia. The following deficits were present during the exam: increased pain,  decreased cervical ROM, increased disability level. Pt would benefit from skilled PT to address these impairments and functional limitations to maximize functional mobility independence.   OBJECTIVE IMPAIRMENTS: decreased activity tolerance, decreased knowledge of condition, decreased ROM, decreased strength, increased fascial restrictions, impaired perceived functional ability, postural dysfunction, and pain.   ACTIVITY LIMITATIONS: carrying, lifting, bending, and standing  PARTICIPATION LIMITATIONS: interpersonal relationship, community activity, and occupation  PERSONAL FACTORS: Time since onset of injury/illness/exacerbation and 1-2 comorbidities:    anxiety, arm DVT, chicken pox, hypertension, and sickle cell anemiaare also affecting patient's functional outcome.   REHAB POTENTIAL: Good  CLINICAL DECISION MAKING: Stable/uncomplicated  EVALUATION COMPLEXITY: Low   GOALS: Goals reviewed with patient? Yes  SHORT TERM GOALS=LONG TERM GOALS due to length of POC   LONG TERM GOALS: Target date: 04/09/2023   Pt will be independent with final HEP  for improved strength, balance, transfers and gait. Baseline: initiated 3/25 Goal status: INITIAL  2.  Pt will improve score on Headache Disability Index to 40 points to demonstrate decreased disability level Baseline: 52 points (3/25) Goal status: INITIAL  3.  Pt will improve her cervical ROM by 10 degrees in lacking directions to demonstrate improved mobility for flexion, extension, and L/R rotation Baseline: 30 deg flexion/40 deg extension/35 deg R rotation/30 deg L rotation (3/25) Goal status: INITIAL    PLAN:  PT FREQUENCY: 1x/week  PT DURATION: 4 weeks (only 2 visits scheduled initially due to high copay)  PLANNED INTERVENTIONS: Therapeutic exercises, Therapeutic activity, Neuromuscular re-education, Balance training, Gait training, Patient/Family education, Self Care, Joint mobilization, Vestibular training, Canalith  repositioning, Visual/preceptual remediation/compensation, Dry Needling, Spinal manipulation, Spinal mobilization, Cryotherapy, Moist heat, Taping, Manual therapy, and Re-evaluation  PLAN FOR NEXT SESSION: perform TPDN to suboccipitals, UT, paraspinals; add to HEP: suboccipital release, cervical stretch with towel, cervical retraction and chin tucks, wall angels, windmill stretch   Excell Seltzer, PT, DPT, CSRS 03/12/2023, 11:48 AM

## 2023-03-12 NOTE — Patient Instructions (Signed)
-   Resume Usual Activities. Notify Physician of any unusual bleeding, erythema or concern for side effects as reviewed above. - Apply ice prn for pain - Tylenol prn for pain - Apply voltaren gel to the neck up to 4x daily to help with headaches, neck pain. - Continue gabapentin 300 mg at nighttime until follow up.   - Follow up as scheduled

## 2023-03-14 ENCOUNTER — Ambulatory Visit: Payer: 59 | Admitting: Neurology

## 2023-03-16 ENCOUNTER — Encounter: Payer: Self-pay | Admitting: Physical Medicine and Rehabilitation

## 2023-03-21 ENCOUNTER — Other Ambulatory Visit: Payer: Self-pay | Admitting: Internal Medicine

## 2023-03-21 DIAGNOSIS — I10 Essential (primary) hypertension: Secondary | ICD-10-CM

## 2023-03-22 ENCOUNTER — Other Ambulatory Visit (HOSPITAL_COMMUNITY): Payer: Self-pay

## 2023-03-22 MED ORDER — AMLODIPINE BESYLATE 5 MG PO TABS
5.0000 mg | ORAL_TABLET | Freq: Every day | ORAL | 0 refills | Status: DC
  Filled 2023-03-22: qty 90, 90d supply, fill #0

## 2023-03-26 ENCOUNTER — Other Ambulatory Visit: Payer: Self-pay

## 2023-03-26 ENCOUNTER — Emergency Department (HOSPITAL_COMMUNITY)
Admission: EM | Admit: 2023-03-26 | Discharge: 2023-03-27 | Disposition: A | Discharge status: Home / Self Care | Payer: 59 | Attending: Emergency Medicine | Admitting: Emergency Medicine

## 2023-03-26 DIAGNOSIS — I10 Essential (primary) hypertension: Secondary | ICD-10-CM | POA: Insufficient documentation

## 2023-03-26 DIAGNOSIS — G43909 Migraine, unspecified, not intractable, without status migrainosus: Secondary | ICD-10-CM

## 2023-03-26 DIAGNOSIS — Z79899 Other long term (current) drug therapy: Secondary | ICD-10-CM | POA: Insufficient documentation

## 2023-03-26 NOTE — ED Triage Notes (Signed)
Pt reports ongoing headaches since August  Has been seen for same. Most recently had "injections" about a week ago and still continues to have pain in left neck and left temple area. No n/v or sensitivity to light.  Pt reports taking gabapentin, tylenol, and using voltaren gel with no relief.

## 2023-03-27 ENCOUNTER — Telehealth: Payer: Self-pay

## 2023-03-27 ENCOUNTER — Telehealth: Payer: Self-pay | Admitting: Neurology

## 2023-03-27 MED ORDER — KETOROLAC TROMETHAMINE 15 MG/ML IJ SOLN
15.0000 mg | Freq: Once | INTRAMUSCULAR | Status: AC
  Administered 2023-03-27: 15 mg via INTRAVENOUS
  Filled 2023-03-27: qty 1

## 2023-03-27 MED ORDER — METOCLOPRAMIDE HCL 5 MG/ML IJ SOLN
10.0000 mg | INTRAMUSCULAR | Status: AC
  Administered 2023-03-27: 10 mg via INTRAVENOUS
  Filled 2023-03-27: qty 2

## 2023-03-27 MED ORDER — SODIUM CHLORIDE 0.9 % IV BOLUS
1000.0000 mL | Freq: Once | INTRAVENOUS | Status: AC
  Administered 2023-03-27: 1000 mL via INTRAVENOUS

## 2023-03-27 NOTE — ED Provider Notes (Signed)
[place] EMERGENCY DEPARTMENT AT [facility] Provider Note   CSN: [id number] Arrival date & time: 03/26/23  2223     History  Chief Complaint  Patient presents with   Headache    Rita Harrison is a 42 y.o. female.  42 year old female presents to the emergency department for evaluation of headaches.  She has a history of chronic migraines that have been intractable since August.  Has undergone brain and cervical spine MRI which have been reassuring.  Was referred to physical medicine and rehabilitation; underwent intramuscular steroid injection approximately 2 weeks ago and reports this has not been helping her discomfort.  She has been seen by neurology in the past.  Had no relief with amitriptyline.  She continues to use gabapentin and Tylenol as well as topical Voltaren without change to her headaches.  Does not have follow-up with neurology until September.  No associated fevers, vomiting, complete vision loss, new or worsening extremity numbness or weakness, recent head injury or trauma.  The history is provided by the patient. No language interpreter was used.  Headache      Home Medications Prior to Admission medications   Medication Sig Start Date End Date Taking? Authorizing Provider  ALPRAZolam (XANAX) 0.25 MG tablet Take 1 tablet (0.25 mg total) by mouth 2 (two) times daily as needed for anxiety. 11/13/22   Etta Grandchild, MD  Aluminum Chloride in Alcohol 6.25 % SOLN Apply 1 actuation topically at bedtime as directed 03/06/22   Etta Grandchild, MD  amLODipine (NORVASC) 5 MG tablet Take 1 tablet (5 mg total) by mouth daily. 03/22/23   Etta Grandchild, MD  clindamycin (CLEOCIN T) 1 % lotion APPLY A THIN LAYER TO THE AFFECTED AREA(S) 2 TIMES PER DAY AS NEEDED 07/14/22     cyclobenzaprine (FLEXERIL) 10 MG tablet Take 1 tablet (10 mg total) by mouth 3 (three) times daily as needed for muscle spasms. 11/02/22   Drema Dallas, DO  Fluocinolone Acetonide Body  (DERMA-SMOOTHE/FS BODY) 0.01 % OIL Apply topically at bedtime as directed. 03/03/22   Etta Grandchild, MD  gabapentin (NEURONTIN) 100 MG capsule Start with 1 capsule by mouth nightly; after 1 week, can increase to 2 - 3 capsules nightly. Call me if any side effects or if no effect. 02/05/23   Angelina Sheriff, DO  ketoconazole (NIZORAL) 2 % shampoo SHAMPOO SCALP, LET SIT 5 MINUTES, THEN RINSE OUT. 05/19/22 05/19/23  Etta Grandchild, MD  modafinil (PROVIGIL) 100 MG tablet Take 1 tablet (100 mg total) by mouth daily. 01/27/23   Etta Grandchild, MD  nebivolol (BYSTOLIC) 10 MG tablet Take 1 tablet (10 mg total) by mouth daily. 07/14/22   Pincus Sanes, MD  omeprazole (PRILOSEC) 20 MG capsule Take 1 capsule (20 mg total) by mouth daily. 09/08/22   Henson, Vickie L, NP-C  potassium chloride (KLOR-CON M) 10 MEQ tablet Take 1 tablet (10 mEq total) by mouth 2 (two) times daily. 11/29/22   Etta Grandchild, MD  topiramate (TOPAMAX) 100 MG tablet Take 1 tablet (100 mg total) by mouth at bedtime. 12/06/22   Drema Dallas, DO  triamterene-hydrochlorothiazide (DYAZIDE) 37.5-25 MG capsule Take 1 capsule by mouth daily. 01/29/23   Etta Grandchild, MD  Ubrogepant (UBRELVY) 100 MG TABS Take 100 mg (1 tablet) by mouth as needed for up to 1 dose (May repeat after 2 hours.  Maximum 2 tablets in 24 hours.). 12/06/22   Drema Dallas, DO  Allergies    Lisinopril, Indapamide, and Spironolactone    Review of Systems   Review of Systems  Neurological:  Positive for headaches.  Ten systems reviewed and are negative for acute change, except as noted in the HPI.    Physical Exam Updated Vital Signs BP (!) 138/100 (BP Location: Right Arm)   Pulse 72   Temp 98 F (36.7 C)   Resp 16   SpO2 98%   Physical Exam Vitals and nursing note reviewed.  Constitutional:      General: She is not in acute distress.    Appearance: She is well-developed. She is not diaphoretic.     Comments: Nontoxic female, in no acute distress   HENT:     Head: Normocephalic and atraumatic.  Eyes:     General: No scleral icterus.    Conjunctiva/sclera: Conjunctivae normal.  Neck:     Comments: No meningismus Cardiovascular:     Rate and Rhythm: Normal rate and regular rhythm.     Pulses: Normal pulses.  Pulmonary:     Effort: Pulmonary effort is normal. No respiratory distress.     Comments: Respirations even and unlabored Musculoskeletal:        General: Normal range of motion.     Cervical back: Normal range of motion.  Skin:    General: Skin is warm and dry.     Coloration: Skin is not pale.     Findings: No erythema or rash.  Neurological:     Mental Status: She is alert and oriented to person, place, and time.     Cranial Nerves: No cranial nerve deficit.     Coordination: Coordination normal.     Comments: No obvious cranial nerve deficits or focal deficits on examination.  Moving all extremities spontaneously.  Psychiatric:        Behavior: Behavior normal.     ED Results / Procedures / Treatments   Labs (all labs ordered are listed, but only abnormal results are displayed) Labs Reviewed - No data to display  EKG None  Radiology No results found.  Procedures Procedures    Medications Ordered in ED Medications  ketorolac (TORADOL) 15 MG/ML injection 15 mg (15 mg Intravenous Given 03/27/23 0145)  metoCLOPramide (REGLAN) injection 10 mg (10 mg Intravenous Given 03/27/23 0145)  sodium chloride 0.9 % bolus 1,000 mL (1,000 mLs Intravenous New Bag/Given 03/27/23 0145)    ED Course/ Medical Decision Making/ A&P Clinical Course as of 03/27/23 0246  Tue Mar 27, 2023  0100 Patient with brain MRI in November 2023 and cervical spine MRI in January 2024.  I have reviewed these results.  While she did have some disc protrusion in her cervical spine, there was no evidence of impingement.  Brain MRI findings noted likely sequela of ongoing migraines.  Given stable and nonfocal neurologic examination as well as  symptom chronicity, will proceed with symptomatic management in the ED.  Do not feel further emergent workup or imaging is presently indicated.  I have encouraged the patient to reach out to her neurologist to move up her presently scheduled appointment. [KH]  0229 Pain 2/10 down from 10/10 on arrival. Feeling better and comfortable for discharge. [KH]    Clinical Course User Index [KH] Antony MaduraHumes, Kayti Poss, PA-C                             Medical Decision Making Risk Prescription drug management.   This patient presents to  the ED for concern of chronic headache, this involves an extensive number of treatment options, and is a complaint that carries with it a high risk of complications and morbidity.  The differential diagnosis includes migraine vs IIH vs tumor vs ICH vs meningitis   Co morbidities that complicate the patient evaluation  Anxiety Sickle cell trait HTN   Additional history obtained:  External records from outside source obtained and reviewed including prior MRI brain and MRI C-spine   Cardiac Monitoring:  The patient was maintained on a cardiac monitor.  I personally viewed and interpreted the cardiac monitored which showed an underlying rhythm of: NSR   Medicines ordered and prescription drug management:  I ordered medication including Toradol and Reglan for headache  Reevaluation of the patient after these medicines showed that the patient improved I have reviewed the patients home medicines and have made adjustments as needed   Test Considered:  CT head - however symptoms chronic in nature and neurologic exam is nonfocal.   Problem List / ED Course:  As above No fever, nuchal rigidity, meningismus to suggest meningitis.  Neurologic exam today is nonfocal.  On reassessment, the patient has had significant improvement in headache symptoms following a migraine cocktail.    Reevaluation:  After the interventions noted above, I reevaluated the patient and  found that they have :improved   Social Determinants of Health:  Insured patient   Dispostion:  After consideration of the diagnostic results and the patients response to treatment, I feel that the patent would benefit from outpatient neurology f/u for reassessment. Return precautions discussed and provided. Patient discharged in stable condition with no unaddressed concerns.          Final Clinical Impression(s) / ED Diagnoses Final diagnoses:  Nonintractable chronic migraine    Rx / DC Orders ED Discharge Orders     None         Antony Madura, PA-C 03/27/23 0248    Zadie Rhine, MD 03/27/23 [phone]

## 2023-03-27 NOTE — Telephone Encounter (Signed)
Advised to follow up with pcp, schedule an appt

## 2023-03-27 NOTE — Transitions of Care (Post Inpatient/ED Visit) (Signed)
   03/27/2023  Name: Rita Harrison MRN: [id number] DOB: September 30, 1981  Today's TOC FU Call Status: Today's TOC FU Call Status:: Unsuccessul Call (1st Attempt) Unsuccessful Call (1st Attempt) Date: 03/27/23  Attempted to reach the patient regarding the most recent Inpatient/ED visit.  Follow Up Plan: Additional outreach attempts will be made to reach the patient to complete the Transitions of Care (Post Inpatient/ED visit) call.   Signature Karena Addison, LPN [facility] Nurse Health Advisor Direct Dial [phone]

## 2023-03-27 NOTE — Telephone Encounter (Signed)
Pt called in stating she had to go to the ED last night for a headache. She says she is supposed to be going to her eye doctor 04/10/23 because she is still having blurred vision today. She just wanted to make Dr. Everlena Cooper aware in case he wanted to change anything. She stated the ED recommended possibly trying Botox since the other medications were not working. She thinks she might be interested in trying it.

## 2023-03-28 ENCOUNTER — Telehealth: Payer: Self-pay

## 2023-03-28 ENCOUNTER — Other Ambulatory Visit (HOSPITAL_COMMUNITY): Payer: Self-pay

## 2023-03-28 MED ORDER — PREDNISONE 10 MG PO TABS
ORAL_TABLET | ORAL | 0 refills | Status: DC
  Filled 2023-03-28: qty 21, 6d supply, fill #0

## 2023-03-28 NOTE — Telephone Encounter (Signed)
Pt called back in wanting to see what she should do for her headaches since the medication is not working. She would like to see what she can do until she can be seen again?

## 2023-03-28 NOTE — Telephone Encounter (Signed)
Patient advised of Dr.Hill note,  We can order prednisone taper. Do you know where patient wants [place] to send it?  Prednisone taper sent to [facility] per patient request.

## 2023-03-28 NOTE — Transitions of Care (Post Inpatient/ED Visit) (Unsigned)
   03/28/2023  Name: Rita Harrison MRN: [id number] DOB: 10-23-1981  Today's TOC FU Call Status: Unsuccessful Call (1st Attempt) Date: 03/28/23  Attempted to reach the patient regarding the most recent Inpatient/ED visit.  Follow Up Plan: Additional outreach attempts will be made to reach the patient to complete the Transitions of Care (Post Inpatient/ED visit) call.   Signature Karena Addison, LPN [facility] Nurse Health Advisor Direct Dial [phone]

## 2023-03-28 NOTE — Telephone Encounter (Signed)
Per patient she had a headache cocktail at the hospital and that did not help. Patient would like to try a prednisone taper to see if that will help.  Per patient the Topiramate and Urbelvy not helping with her headaches.  Advised patient that Dr.Jaffe is out of the office he may ask for a visit since you two haven't discuss Botox.

## 2023-03-29 NOTE — Transitions of Care (Post Inpatient/ED Visit) (Signed)
   03/29/2023  Name: Rita Harrison MRN: [id number] DOB: [birth year] duplicate Today's TOC FU Call Status: Today's TOC FU Call Status:: Unsuccessul Call (1st Attempt) Unsuccessful Call (1st Attempt) Date: 03/27/23  Attempted to reach the patient regarding the most recent Inpatient/ED visit.  Follow Up Plan: No further outreach attempts will be made at this time. We have been unable to contact the patient.  Signature Karena Addison, LPN [facility] Nurse Health Advisor Direct Dial [phone]

## 2023-03-29 NOTE — Transitions of Care (Post Inpatient/ED Visit) (Signed)
   03/29/2023  Name: Rita Harrison MRN: [id number] DOB: 04/16/1981  Today's TOC FU Call Status: Today's TOC FU Call Status:: Unsuccessful Call (3rd Attempt) Unsuccessful Call (1st Attempt) Date: 03/28/23 Unsuccessful Call (3rd Attempt) Date: 03/29/23  Attempted to reach the patient regarding the most recent Inpatient/ED visit.  Follow Up Plan: No further outreach attempts will be made at this time. We have been unable to contact the patient. duplicate Signature Karena Addison, LPN [facility] Nurse Health Advisor Direct Dial [phone]

## 2023-03-30 ENCOUNTER — Ambulatory Visit: Payer: 59 | Attending: Physical Medicine and Rehabilitation | Admitting: Physical Therapy

## 2023-03-30 DIAGNOSIS — M6281 Muscle weakness (generalized): Secondary | ICD-10-CM

## 2023-03-30 DIAGNOSIS — M5412 Radiculopathy, cervical region: Secondary | ICD-10-CM | POA: Diagnosis not present

## 2023-03-30 DIAGNOSIS — M5481 Occipital neuralgia: Secondary | ICD-10-CM | POA: Diagnosis not present

## 2023-03-30 DIAGNOSIS — M542 Cervicalgia: Secondary | ICD-10-CM | POA: Diagnosis not present

## 2023-03-30 NOTE — Therapy (Signed)
OUTPATIENT PHYSICAL THERAPY CERVICAL TREATMENT   Patient Name: Rita Harrison MRN: [id number] DOB:1981/11/15, 42 y.o., female Today's Date: 03/30/2023  END OF SESSION:  PT End of Session - 03/30/23 1317     Visit Number 2    Number of Visits 5   with eval   Date for PT Re-Evaluation 04/23/23    Authorization Type [employer])    PT Start Time 1315    PT Stop Time 1400    PT Time Calculation (min) 45 min    Activity Tolerance Patient limited by pain    Behavior During Therapy [facility] for tasks assessed/performed              Past Medical History:  Diagnosis Date   Anxiety    Arm DVT (deep venous thromboembolism), chronic (HCC)    RUE   Chicken pox    Encounter for general adult medical examination with abnormal findings 11/09/2020   Hypertension    Sickle cell anemia (HCC)    sickle cell trait   No past surgical history on file. Patient Active Problem List   Diagnosis Date Noted   Occipital neuralgia of right side 02/06/2023   Cervicalgia 02/06/2023   Uncontrolled daytime somnolence 01/27/2023   Protrusion of cervical intervertebral disc 01/23/2023   Stage 3a chronic kidney disease 01/23/2023   Chronic hypokalemia 11/29/2022   Intractable hemiplegic migraine with status migrainosus 10/16/2022   Primary hypertension 10/16/2022   Gastroesophageal reflux disease 09/08/2022   Seborrheic dermatitis of scalp 03/02/2022   Hyperlipidemia with target LDL less than 130 11/19/2020   Vitamin D deficiency disease 11/11/2020   Encounter for general adult medical examination with abnormal findings 11/09/2020   Hammer toes, bilateral 03/14/2019    PCP: Etta Grandchild, MD  REFERRING PROVIDER: Angelina Sheriff, DO  REFERRING DIAG: [phone] (ICD-10-CM) - Occipital neuralgia of right side M54.2 (ICD-10-CM) - Cervicalgia G44.021 (ICD-10-CM) - Intractable chronic cluster headache M50.20 (ICD-10-CM) - Protrusion of cervical intervertebral disc  THERAPY DIAG:   Muscle weakness (generalized)  Bilateral occipital neuralgia  Cervicalgia  Radiculopathy, cervical region  Rationale for Evaluation and Treatment: Rehabilitation  ONSET DATE: 02/05/2023 (referall); symptoms started in August 2023  SUBJECTIVE:                                                                                                                                                                                                         SUBJECTIVE STATEMENT: Pt had injections from Dr. Shearon Stalls but felt like they didn't help the pain. Pt having blurry vision in  L eye. Pt reports she started prednisone this morning so that has helped control pain more today. Pt still with minor headachbe but not as intense. Pt reports her workplace did make accommodations and she got a computer cart and a laptop stand, is getting up and moving around more at work vs just sitting. Pt also got a "headache" wrap to block out light and provide cold or heat sensation to her face.  Hand dominance: Right  PERTINENT HISTORY:  anxiety, arm DVT, chicken pox, hypertension, and sickle cell anemia  PAIN:  Are you having pain? Yes: NPRS scale: 5/10 Pain location: base of R side of neck, up side of head, into center of forehead Pain description: dull Aggravating factors: standing and bending over to type at work Relieving factors: ibuprofen helps, Tylenol wears off fast  PRECAUTIONS: None  WEIGHT BEARING RESTRICTIONS: No  FALLS:  Has patient fallen in last 6 months? 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 Shearon Stalls 03/12/23  OBJECTIVE:   DIAGNOSTIC FINDINGS:  Cervical Spine MRI  01/12/2023 IMPRESSION: 1. Small disc protrusions at C4-C5 and C5-C6 contacting the ventral cord. No stenosis or impingement.  PATIENT SURVEYS:  Headache Disability Index: 52 points  COGNITION: Overall cognitive status: Within functional limits for tasks assessed  SENSATION: Can have N/T down into arms, usually sleeps on L side so has numbness down this side Light touch sensation WFL No numbness with median, ulnar, or radial nerve tensioners  POSTURE: rounded shoulders and forward head  PALPATION: TTP in B upper traps and paraspinals   CERVICAL ROM:   Active ROM A/PROM (deg) Eval 03/12/23  Flexion 30  Extension 40 (pain/tight L side of neck)  Right lateral flexion 45  (pain in L side of neck)  Left lateral flexion 45  (pain in R side, more than on L side)  Right rotation 35  Left rotation 30 (pain/tight on R side of neck)   (Blank rows = not tested)  UPPER EXTREMITY ROM:  Active ROM Right eval Left eval  Shoulder flexion [facility]  Shoulder extension    Shoulder abduction [facility] WFL (feels tightness in back of shoulder)  Shoulder adduction    Shoulder extension    Shoulder internal rotation    Shoulder external rotation    Elbow flexion [facility] WFL  Elbow extension [facility] WFL  Wrist flexion    Wrist extension    Wrist ulnar deviation    Wrist radial deviation    Wrist pronation    Wrist supination     (Blank rows = not tested)  UPPER EXTREMITY MMT:  MMT Right eval Left eval  Shoulder flexion 5 5  Shoulder extension    Shoulder abduction 4 4 (pain in back of shoulder)  Shoulder adduction    Shoulder extension    Shoulder internal rotation    Shoulder external rotation    Middle trapezius    Lower trapezius    Elbow flexion 5 5  Elbow extension 5 5  Wrist flexion    Wrist extension    Wrist ulnar deviation    Wrist radial deviation    Wrist pronation    Wrist supination    Grip strength     (  Blank rows = not tested)  CERVICAL SPECIAL TESTS:  Upper  limb tension test (ULTT): Negative and Distraction test: Positive   TODAY'S TREATMENT:                                                                                                                              TherEx Supine cervical retraction x 10 reps Seated cervical rotation stretch with towel 3 x 30 sec each direction  Manual Therapy Suboccipital release 5 x 30 sec each Cervical distraction with L and R lateral SB 5 x 30 sec each direction   TherAct Trigger Point Dry-Needling  Treatment instructions: Expect mild to moderate muscle soreness. S/S of pneumothorax if dry needled over a lung field, and to seek immediate medical attention should they occur. Patient verbalized understanding of these instructions and education.  Patient Consent Given: Yes Education handout provided: Yes Muscles treated: L suboccipitals, L and R upper trap Treatment response/outcome: deep ache/muscle cramp; muscle twitch detected   Trigger Point Dry Needling  What is Trigger Point Dry Needling (DN)? DN is a physical therapy technique used to treat muscle pain and dysfunction. Specifically, DN helps deactivate muscle trigger points (muscle knots).  A thin filiform needle is used to penetrate the skin and stimulate the underlying trigger point. The goal is for a local twitch response (LTR) to occur and for the trigger point to relax. No medication of any kind is injected during the procedure.   What Does Trigger Point Dry Needling Feel Like?  The procedure feels different for each individual patient. Some patients report that they do not actually feel the needle enter the skin and overall the process is not painful. Very mild bleeding may occur. However, many patients feel a deep cramping in the muscle in which the needle was inserted. This is the local twitch response.   How Will I feel after the treatment? Soreness is normal, and the onset of soreness may not occur for a few hours. Typically this  soreness does not last longer than two days.  Bruising is uncommon, however; ice can be used to decrease any possible bruising.  In rare cases feeling tired or nauseous after the treatment is normal. In addition, your symptoms may get worse before they get better, this period will typically not last longer than 24 hours.   What Can I do After My Treatment? Increase your hydration by drinking more water for the next 24 hours. You may place ice or heat on the areas treated that have become sore, however, do not use heat on inflamed or bruised areas. Heat often brings more relief post needling. You can continue your regular activities, but vigorous activity is not recommended initially after the treatment for 24 hours. DN is best combined with other physical therapy such as strengthening, stretching, and other therapies.    PATIENT EDUCATION:  Education details: continue HEP, added to HEP, TPDN Person educated: Patient Education method: Explanation, Demonstration, and  Handouts Education comprehension: verbalized understanding, returned demonstration, and needs further education  HOME EXERCISE PROGRAM: Access Code: [email or web address] URL: [email or web address] Date: 03/12/2023 Prepared by: Peter Congo  Exercises - Seated Upper Trapezius Stretch  - 1 x daily - 7 x weekly - 1 sets - 5-10 reps - 30 sec hold - Gentle Levator Scapulae Stretch  - 1 x daily - 7 x weekly - 1 sets - 04-1009 reps - 30 sec hold - Supine Suboccipital Release with Tennis Balls  - 1 x daily - 7 x weekly - 1 sets - 1 reps - 5-10 minutes hold - Supine Cervical Retraction with Towel  - 1 x daily - 7 x weekly - 3 sets - 10 reps - Seated Assisted Cervical Rotation with Towel  - 1 x daily - 7 x weekly - 1 sets - 5 reps - 30 sec hold  ASSESSMENT:  CLINICAL IMPRESSION: Emphasis of skilled PT session on initiating TPDN, performing manual therapy, and adding stretching and exercises to HEP to address ongoing pain and  headaches leading to decreased overall function. Pt with some relief of tightness following TPDN, manual therapy, and stretches this session. Pt continues to benefit from skilled therapy services to work towards LTGs. Continue POC.    OBJECTIVE IMPAIRMENTS: decreased activity tolerance, decreased knowledge of condition, decreased ROM, decreased strength, increased fascial restrictions, impaired perceived functional ability, postural dysfunction, and pain.   ACTIVITY LIMITATIONS: carrying, lifting, bending, and standing  PARTICIPATION LIMITATIONS: interpersonal relationship, community activity, and occupation  PERSONAL FACTORS: Time since onset of injury/illness/exacerbation and 1-2 comorbidities:    anxiety, arm DVT, chicken pox, hypertension, and sickle cell anemiaare also affecting patient's functional outcome.   REHAB POTENTIAL: Good  CLINICAL DECISION MAKING: Stable/uncomplicated  EVALUATION COMPLEXITY: Low   GOALS: Goals reviewed with patient? Yes  SHORT TERM GOALS=LONG TERM GOALS due to length of POC   LONG TERM GOALS: Target date: 04/09/2023   Pt will be independent with final HEP for improved strength, balance, transfers and gait. Baseline: initiated 3/25 Goal status: INITIAL  2.  Pt will improve score on Headache Disability Index to 40 points to demonstrate decreased disability level Baseline: 52 points (3/25) Goal status: INITIAL  3.  Pt will improve her cervical ROM by 10 degrees in lacking directions to demonstrate improved mobility for flexion, extension, and L/R rotation Baseline: 30 deg flexion/40 deg extension/35 deg R rotation/30 deg L rotation (3/25) Goal status: INITIAL    PLAN:  PT FREQUENCY: 1x/week  PT DURATION: 4 weeks (only 2 visits scheduled initially due to high copay)  PLANNED INTERVENTIONS: Therapeutic exercises, Therapeutic activity, Neuromuscular re-education, Balance training, Gait training, Patient/Family education, Self Care, Joint  mobilization, Vestibular training, Canalith repositioning, Visual/preceptual remediation/compensation, Dry Needling, Spinal manipulation, Spinal mobilization, Cryotherapy, Moist heat, Taping, Manual therapy, and Re-evaluation  PLAN FOR NEXT SESSION: perform TPDN to suboccipitals, UT, paraspinals; add to HEP: cervical retraction and chin tucks, wall angels, windmill stretch, review HEP as UT stretch causing dizziness?   Peter Congo, PT, DPT, CSRS 03/30/2023, 2:02 PM

## 2023-04-04 ENCOUNTER — Encounter: Payer: 59 | Attending: Physical Medicine and Rehabilitation | Admitting: Physical Medicine and Rehabilitation

## 2023-04-04 VITALS — BP 129/81 | HR 83 | Ht 66.0 in | Wt 198.0 lb

## 2023-04-04 DIAGNOSIS — M542 Cervicalgia: Secondary | ICD-10-CM

## 2023-04-04 DIAGNOSIS — M5481 Occipital neuralgia: Secondary | ICD-10-CM | POA: Diagnosis not present

## 2023-04-04 NOTE — Progress Notes (Signed)
Subjective:    Patient ID: Rita Harrison, female    DOB: 1981/03/19, 42 y.o.   MRN: [id number]  HPI   Rita Harrison is a 42 y.o. year old female  who  has a past medical history of Anxiety, Arm DVT (deep venous thromboembolism), chronic (HCC), Chicken pox, Encounter for general adult medical examination with abnormal findings (11/09/2020), Hypertension, and Sickle cell anemia (HCC).   They are presenting to PM&R clinic for follow up related to chronic R sided headaches, now L sided as well .  Plan from last visit: Occipital neuralgia of right side Assessment & Plan: Start gabapentin 100 mg. Start with 1 capsule nightly; after 1 week, can increase to 2-3 capsules nightly. Call me if any side effects or if no effect.    See me in 1-2 weeks for an occipital nerve block   Orders: -     Ambulatory referral to Physical Therapy   Cervicalgia Assessment & Plan: I have referred you to PT for neck range of motion, stretching, massage and development of home exercises.    See me for general follow up in 2 months       Orders: -     Ambulatory referral to Physical Therapy   Intractable chronic cluster headache -     Ambulatory referral to Physical Therapy   Protrusion of cervical intervertebral disc Assessment & Plan: MRI C spine 01/12/23 showing Small disc protrusions at C4-C5 and C5-C6 contacting the ventral cord. No stenosis or impingement.   Likely contributing to   cervicalgia, and headaches. Discussed with patient how these symptoms may be related d/t tight fascial connections around greater occipital nerve.     I have given you a handout on good desk ergonomics and a letter for your employer for height adjusting desk.    Orders: -     Ambulatory referral to Physical Therapy  S/p R occipital nerve block 03/12/23 - Resume Usual Activities. Notify Physician of any unusual bleeding, erythema or concern for side effects as reviewed above. - Apply ice prn for pain -  Tylenol prn for pain - Apply voltaren gel to the neck up to 4x daily to help with headaches, neck pain. - Continue gabapentin 300 mg at nighttime until follow up.   - Follow up as scheduled       Interval Hx:  - Therapies: Going to PT; dry needling and HEP, massage with a tennis ball. Friday will be her third session. Says the therapy is helpful in the moment but doesn't really last.    - Follow ups: Went to the ER on 4/9; lasted the entire next day and then started back up.     - Is going to see Dr, Everlena Cooper tomorrow;  was trying to reschedule here for later but was told she could always get billed later.    - Falls:None; has had some issue with blurry vision in her R eye, has an opthomology appointment next week.    - DME: Got an ice band to put on her scalp, helps her sleep at nighttime.    - Medications: Gabapentin not helpful; takes QHS and feels tension coming at around 4 am. Dr. Everlena Cooper called in a Medrol dosepak which has been helpful for inflammation during the day.   States she has failed topomax, [place], nudexta, and a few other migraine medications. Hates to feel like a zombie with medications.    - Other concerns: No improvement with trigger point or nerve  block.    Pain Inventory Average Pain 4 Pain Right Now 5 My pain is dull  In the last 24 hours, has pain interfered with the following? General activity 6 Relation with others 7 Enjoyment of life 7 What TIME of day is your pain at its worst? morning  and night Sleep (in general) Fair  Pain is worse with: walking, standing, and some activites Pain improves with:  none Relief from Meds:  none  Family History  Problem Relation Age of Onset   Sickle cell anemia Father    Kidney cancer Father    Migraines Sister    Breast cancer Maternal Grandmother    Social History   Socioeconomic History   Marital status: Single    Spouse name: Not on file   Number of children: 1   Years of education: 13   Highest  education level: Not on file  Occupational History   Occupation: CNA  Tobacco Use   Smoking status: Former    Packs/day: 0.50    Years: 10.00    Additional pack years: 0.00    Total pack years: 5.00    Types: Cigarettes   Smokeless tobacco: Never  Vaping Use   Vaping Use: Former  Substance and Sexual Activity   Alcohol use: Not Currently    Comment: occasionally   Drug use: Not Currently    Types: Marijuana   Sexual activity: Yes    Partners: Male    Birth control/protection: Condom  Other Topics Concern   Not on file  Social History Narrative   Fun: Travel, family, son's football games    Denies religious beliefs effecting health care   Feels safe at home and denies abuse.    Right handed   No caffeine   One story home   Social Determinants of Health   Financial Resource Strain: Low Risk  (10/05/2022)   Overall Financial Resource Strain (CARDIA)    Difficulty of Paying Living Expenses: Not hard at all  Food Insecurity: No Food Insecurity (10/05/2022)   Hunger Vital Sign    Worried About Running Out of Food in the Last Year: Never true    Ran Out of Food in the Last Year: Never true  Transportation Needs: No Transportation Needs (10/05/2022)   PRAPARE - Administrator, Civil Service (Medical): No    Lack of Transportation (Non-Medical): No  Physical Activity: Insufficiently Active (10/05/2022)   Exercise Vital Sign    Days of Exercise per Week: 7 days    Minutes of Exercise per Session: 10 min  Stress: Stress Concern Present (10/05/2022)   [employer] of Occupational Health - Occupational Stress Questionnaire    Feeling of Stress : To some extent  Social Connections: Not on file   No past surgical history on file. No past surgical history on file. Past Medical History:  Diagnosis Date   Anxiety    Arm DVT (deep venous thromboembolism), chronic (HCC)    RUE   Chicken pox    Encounter for general adult medical examination with abnormal  findings 11/09/2020   Hypertension    Sickle cell anemia (HCC)    sickle cell trait   BP 129/81   Pulse 83   Ht  (1.676 m)   Wt 198 lb (89.8 kg)   SpO2 98%   BMI 31.96 kg/m   Opioid Risk Score:   Fall Risk Score:  `1  Depression screen [facility] 2/9     02/05/2023   11:30  AM 01/23/2023    1:58 PM 05/17/2022    1:46 PM 12/01/2021   11:08 AM 12/01/2021   11:07 AM 11/09/2020    3:56 PM 02/21/2018   10:05 AM  Depression screen PHQ 2/9  Decreased Interest 1 0 0 0 0 0 0  Down, Depressed, Hopeless 0 0 0 0  PHQ - 2 Score 1 0 0 0  Altered sleeping 0  0 0     Tired, decreased energy 2  0 0     Change in appetite 1  0 1     Feeling bad or failure about yourself  0  0 0     Trouble concentrating 2  2 0     Moving slowly or fidgety/restless 1  3 0     Suicidal thoughts 0  0 0     PHQ-9 Score Difficult doing work/chores   Not difficult at all          Review of Systems  Musculoskeletal:        Head pain (migraine0  All other systems reviewed and are negative.      Objective:   Physical Exam   PE: Constitution: Appropriate appearance for age. No apparent distress   Resp: No respiratory distress. No accessory muscle usage. on RA Cardio: Well perfused appearance. No peripheral edema. Abdomen: Nondistended. Nontender.   Psych: Appropriate mood and affect. Neuro: AAOx4. No apparent cognitive deficits   Neurologic Exam:   DTRs: Reflexes were 2+ in bilateral achilles, patella, biceps, BR and triceps. Babinsky: flexor responses b/l.   Hoffmans: negative b/l Sensory exam: revealed normal sensation in all dermatomal regions in bilateral upper extremities Motor exam: strength 5/5 throughout bilateral upper extremities Coordination: Fine motor coordination was normal.   Gait: normal  Neck: Neck AROM0 limited in flexion, extension, R rotation, R>L sidebending No TTP over neck, shoulders, or occiput. Mild reproduciton of symptoms on bilateral occipital  nerve palpation.       Assessment & Plan:   Rita Harrison is a 42 y.o. year old female  who  has a past medical history of Anxiety, Arm DVT (deep venous thromboembolism), chronic, Chicken pox, Encounter for general adult medical examination with abnormal findings (11/09/2020), Hypertension, and Sickle cell anemia.   They are presenting to PM&R clinic for treatment of chronic migraines.    Occipital neuralgia of right side Not improved with occipital nerve block on R, now bilateral Has failed many OP migraine regimens Follow with Dr. Everlena Cooper as this is his area of specialty and is your primary issue; if you decide to get migraine botox, you may have that completed with his office or with [place] if scheduling is difficult  Cervicalgia MRI C spine 01/12/23 showing Small disc protrusions at C4-C5 and C5-C6 contacting the ventral cord. No stenosis or impingement. Benefiting from PT; continue ROM exercises Follow up PRN

## 2023-04-04 NOTE — Patient Instructions (Signed)
Follow up PRN for migraine botox

## 2023-04-04 NOTE — Progress Notes (Unsigned)
NEUROLOGY FOLLOW UP OFFICE NOTE  Rita Harrison [id number]  Assessment/Plan:   1  Chronic migraine without aura, without status migrainosus, not intractable complicated by medication overuse.  The fact that she knows the exact date of onset also suggests new daily persistent headaches - she does have headache free periods which may be due to short-term effects of the analgesics. 2  Myofascial cervical pain   Migraine prevention:  start topiramate  at bedtime.  We can increase dose in 4 weeks if needed.  Side effects discussed Migraine rescue:  Stop ibuprofen and Tylenol.  Instead try rizatriptan  - limit to no more than 2 days out of week.  Zofran for nausea Rule of thumb:  Limit use of pain relievers to no more than 2 days out of week to prevent risk of rebound or medication-overuse headache. Cyclobenzaprine  TID PRN for neck stiffness - caution for drowsiness Keep headache diary Follow up 4 to 5 months.       Subjective:  Rita Harrison is a 42 year old right-handed female with HTN, sickle cell trait and history of right upper extremity DVT who follows up for migraines.  UPDATE: Since last visit in November, she has been to the ED on multiple occasions for intractable migraines.  Last visit, we started topiramate.  Titrated to  - fatigue Rizatriptan - ineffective - sumatriptan  - Bernita Raisin   She continued having bilateral neck pain but also numbness in the hands. She saw [facility] as well as [facility].  MRI of cervical spine on 01/12/2023 personally reviewed revealed only small disc protrusions at C4-5 and C5-6 contacting the ventral cord but no stenosis or impingement.  She received right occipital nerve block on 3/25 which was ineffective.  Started on gabapentin and Volteran gel.  Intensity:  *** Duration:  *** Frequency:  *** Frequency of abortive medication: *** Current NSAIDS/analgesics:  Tylenol, Voltaren gel,  ibuprofen Current triptans:  none Current ergotamine:  none Current anti-emetic:  ondansetron ODT  Current muscle relaxants:  none Current Antihypertensive medications:  nebivolol, triamterene-HCTZ Current Antidepressant medications:  none Current Anticonvulsant medications:  gabapentin ***, topiramate  at bedtime Current anti-CGRP:  Ubrelvy  Current Vitamins/Herbal/Supplements:  none Current Antihistamines/Decongestants:  none Other therapy:  none Birth control:  none Other medications:  Alprazolam (anxiety)     Caffeine:  no coffee, soda Diet:  Drinks a lot of water.  Stopped soda.  Increased fruits and vegetables intake.   Exercise:  goes to gym most days Depression:  some; Anxiety:  yes.  Her son's father and her grandmother passed away last year.  Her friend passed away 2 years ago.   Other pain:  no Sleep hygiene:  good until she started having headaches.  Headaches may wake her up.  Also has hot flashes at night  HISTORY:  Onset:  August 02, 2022 Location:  right frontal Quality:  pressure Intensity:  mild.  Gradual onset Aura:  absent Prodrome:  absent Associated symptoms:  Nausea, photophobia, phonophobia.  She denies associated vomiting, visual disturbance, autonomic symptoms or unilateral numbness or weakness. Duration:  Treats it immediately with ibuprofen or Tylenol Extra-strength and will last 15-30 minutes Frequency:  3 times daily (8:30 AM, around noon, 7 PM) daily.  Sometimes may wake her up in middle of night for which she takes another Tylenol or ibuprofen Frequency of abortive medication: 3 to 4 times daily Triggers:  unknown Relieving factors:  Tylenol and  ibuprofen Activity:  not aggravating   She saw her gynecologist.  Blood work showed she is almost perimenopausal.  Reports hot flashes at night.  Blood pressure has been elevated but unsure if cause or effect of headache.     MRI of brain without contrast on 10/23/2022 personally reviewed  revealed minimal scattered T2 FLAIR hyperintense foci within the cerebral white matter as well as prominent posterior pituitary bright spot vs pars intermedia cyst but nothing concerning that would explain headaches.     Reports similar headaches sometimes associated with her cycle, but infrequent.   Past NSAIDS/analgesics:  ketorolac, meloxicam, BC powder Past abortive triptans:  rizatriptan, sumatriptan tab Past abortive ergotamine:  none Past muscle relaxants:  methocarbamol Past anti-emetic:  none Past antihypertensive medications:  lisinopril Past antidepressant medications:  none Past anticonvulsant medications:  none Past anti-CGRP:  Nurtec (made migraine worse) Past vitamins/Herbal/Supplements:  none Past antihistamines/decongestants:  diphenhydramine Other past therapies:  occipital nerve block       Family history of headache:  Sister (migraines).  No family history of cerebral aneurysms.    PAST MEDICAL HISTORY: Past Medical History:  Diagnosis Date   Anxiety    Arm DVT (deep venous thromboembolism), chronic (HCC)    RUE   Chicken pox    Encounter for general adult medical examination with abnormal findings 11/09/2020   Hypertension    Sickle cell anemia (HCC)    sickle cell trait    MEDICATIONS: Current Outpatient Medications on File Prior to Visit  Medication Sig Dispense Refill   ALPRAZolam (XANAX) 0.25 MG tablet Take 1 tablet (0.25 mg total) by mouth 2 (two) times daily as needed for anxiety. 20 tablet 3   Aluminum Chloride in Alcohol 6.25 % SOLN Apply 1 actuation topically at bedtime as directed 60 mL 3   amLODipine (NORVASC) 5 MG tablet Take 1 tablet (5 mg total) by mouth daily. 90 tablet 0   clindamycin (CLEOCIN T) 1 % lotion APPLY A THIN LAYER TO THE AFFECTED AREA(S) 2 TIMES PER DAY AS NEEDED 60 mL 1   cyclobenzaprine (FLEXERIL) 10 MG tablet Take 1 tablet (10 mg total) by mouth 3 (three) times daily as needed for muscle spasms. 90 tablet 5   Fluocinolone  Acetonide Body (DERMA-SMOOTHE/FS BODY) 0.01 % OIL Apply topically at bedtime as directed. 118 mL 1   gabapentin (NEURONTIN) 100 MG capsule Start with 1 capsule by mouth nightly; after 1 week, can increase to 2 - 3 capsules nightly. Call me if any side effects or if no effect. 90 capsule 1   ketoconazole (NIZORAL) 2 % shampoo SHAMPOO SCALP, LET SIT 5 MINUTES, THEN RINSE OUT. 120 mL 2   modafinil (PROVIGIL) 100 MG tablet Take 1 tablet (100 mg total) by mouth daily. 90 tablet 0   nebivolol (BYSTOLIC) 10 MG tablet Take 1 tablet (10 mg total) by mouth daily. 90 tablet 3   omeprazole (PRILOSEC) 20 MG capsule Take 1 capsule (20 mg total) by mouth daily. 30 capsule 1   potassium chloride (KLOR-CON M) 10 MEQ tablet Take 1 tablet (10 mEq total) by mouth 2 (two) times daily. 180 tablet 1   predniSONE (DELTASONE) 10 MG tablet Take 6 tablets by mouth day 1, then 5 tablets day 2, then 4 tablets day 3, then 3 tablets day 4, then 2 tablets day 5, then 1 tablet day 6, then STOP 21 tablet 0   topiramate (TOPAMAX) 100 MG tablet Take 1 tablet (100 mg total) by mouth at bedtime.  30 tablet 5   triamterene-hydrochlorothiazide (DYAZIDE) 37.5-25 MG capsule Take 1 capsule by mouth daily. 90 capsule 0   Ubrogepant (UBRELVY) 100 MG TABS Take 100 mg (1 tablet) by mouth as needed for up to 1 dose (May repeat after 2 hours.  Maximum 2 tablets in 24 hours.). 8 tablet 5   No current facility-administered medications on file prior to visit.    ALLERGIES: Allergies  Allergen Reactions   Lisinopril Shortness Of Breath and Itching    angioedema   Indapamide Rash   Spironolactone Other (See Comments)    itching    FAMILY HISTORY: Family History  Problem Relation Age of Onset   Sickle cell anemia Father    Kidney cancer Father    Migraines Sister    Breast cancer Maternal Grandmother       Objective:  *** General: No acute distress.  Patient appears ***-groomed.   Head:  Normocephalic/atraumatic Eyes:  Fundi  examined but not visualized Neck: supple, no paraspinal tenderness, full range of motion Heart:  Regular rate and rhythm Lungs:  Clear to auscultation bilaterally Back: No paraspinal tenderness Neurological Exam: alert and oriented to person, place, and time.  Speech fluent and not dysarthric, language intact.  CN II-XII intact. Bulk and tone normal, muscle strength 5/5 throughout.  Sensation to light touch intact.  Deep tendon reflexes 2+ throughout, toes downgoing.  Finger to nose testing intact.  Gait normal, Romberg negative.   Shon Millet, DO  CC: ***

## 2023-04-05 ENCOUNTER — Ambulatory Visit: Payer: 59 | Admitting: Neurology

## 2023-04-05 ENCOUNTER — Other Ambulatory Visit (HOSPITAL_COMMUNITY): Payer: Self-pay

## 2023-04-05 ENCOUNTER — Encounter: Payer: Self-pay | Admitting: Neurology

## 2023-04-05 ENCOUNTER — Telehealth: Payer: Self-pay

## 2023-04-05 ENCOUNTER — Other Ambulatory Visit: Payer: Self-pay

## 2023-04-05 VITALS — BP 135/82 | HR 78 | Ht 66.0 in | Wt 199.0 lb

## 2023-04-05 DIAGNOSIS — M5481 Occipital neuralgia: Secondary | ICD-10-CM | POA: Diagnosis not present

## 2023-04-05 DIAGNOSIS — M542 Cervicalgia: Secondary | ICD-10-CM | POA: Diagnosis not present

## 2023-04-05 DIAGNOSIS — G43709 Chronic migraine without aura, not intractable, without status migrainosus: Secondary | ICD-10-CM

## 2023-04-05 MED ORDER — GABAPENTIN 300 MG PO CAPS
600.0000 mg | ORAL_CAPSULE | Freq: Every day | ORAL | 5 refills | Status: DC
  Filled 2023-04-05: qty 60, 30d supply, fill #0
  Filled 2023-04-30: qty 60, 30d supply, fill #1

## 2023-04-05 NOTE — Telephone Encounter (Signed)
Patient seen in office, Per Dr.Jaffe patient to start Botox.

## 2023-04-05 NOTE — Patient Instructions (Signed)
Increase gabapentin to  at bedtime Will start Botox Limit use of pain relievers to no more than 2 days out of week to prevent risk of rebound or medication-overuse headache. Continue physical therapy Keep headache diary Follow up for first Botox.

## 2023-04-06 ENCOUNTER — Other Ambulatory Visit: Payer: Self-pay

## 2023-04-06 ENCOUNTER — Ambulatory Visit: Payer: 59 | Admitting: Physical Therapy

## 2023-04-06 DIAGNOSIS — M5412 Radiculopathy, cervical region: Secondary | ICD-10-CM | POA: Diagnosis not present

## 2023-04-06 DIAGNOSIS — M6281 Muscle weakness (generalized): Secondary | ICD-10-CM | POA: Diagnosis not present

## 2023-04-06 DIAGNOSIS — M5481 Occipital neuralgia: Secondary | ICD-10-CM

## 2023-04-06 DIAGNOSIS — M542 Cervicalgia: Secondary | ICD-10-CM | POA: Diagnosis not present

## 2023-04-06 NOTE — Therapy (Signed)
OUTPATIENT PHYSICAL THERAPY CERVICAL TREATMENT   Patient Name: Rita Harrison MRN: [id number] DOB:[birth year], 42 y.o., female Today's Date: 04/06/2023  END OF SESSION:  PT End of Session - 04/06/23 1322     Visit Number 3    Number of Visits 5   with eval   Date for PT Re-Evaluation 04/23/23    Authorization Type [employer]    PT Start Time 1320    PT Stop Time 1358    PT Time Calculation (min) 38 min    Activity Tolerance Patient limited by pain    Behavior During Therapy [facility] for tasks assessed/performed               Past Medical History:  Diagnosis Date   Anxiety    Arm DVT (deep venous thromboembolism), chronic    RUE   Chicken pox    Encounter for general adult medical examination with abnormal findings 11/09/2020   Hypertension    Sickle cell anemia    sickle cell trait   No past surgical history on file. Patient Active Problem List   Diagnosis Date Noted   Occipital neuralgia of right side 02/06/2023   Cervicalgia 02/06/2023   Uncontrolled daytime somnolence 01/27/2023   Protrusion of cervical intervertebral disc 01/23/2023   Stage 3a chronic kidney disease 01/23/2023   Chronic hypokalemia 11/29/2022   Intractable hemiplegic migraine with status migrainosus 10/16/2022   Primary hypertension 10/16/2022   Gastroesophageal reflux disease 09/08/2022   Seborrheic dermatitis of scalp 03/02/2022   Hyperlipidemia with target LDL less than 130 11/19/2020   Vitamin D deficiency disease 11/11/2020   Encounter for general adult medical examination with abnormal findings 11/09/2020   Hammer toes, bilateral 03/14/2019    PCP: Etta Grandchild, MD  REFERRING PROVIDER: Angelina Sheriff, DO  REFERRING DIAG: [phone] (ICD-10-CM) - Occipital neuralgia of right side M54.2 (ICD-10-CM) - Cervicalgia G44.021 (ICD-10-CM) - Intractable chronic cluster headache M50.20 (ICD-10-CM) - Protrusion of cervical intervertebral disc  THERAPY DIAG:  Muscle  weakness (generalized)  Bilateral occipital neuralgia  Cervicalgia  Radiculopathy, cervical region  Rationale for Evaluation and Treatment: Rehabilitation  ONSET DATE: 02/05/2023 (referall); symptoms started in August 2023  SUBJECTIVE:                                                                                                                                                                                                         SUBJECTIVE STATEMENT: Pt reports that she saw Dr. Everlena Cooper yesterday and he increased her gabapentin. Pt also going to try a botox  injection to treat her muscle spasticity/pain but has to wait for insurance approval for this. Pt reports that her pain and symptoms have been a bit better over the past week but she has also been on the Prednisone taper-down. Pt reports her current pain is 5/10, was worse this morning when she woke up with severe neck pain.  Hand dominance: Right  PERTINENT HISTORY:  anxiety, arm DVT, chicken pox, hypertension, and sickle cell anemia  PAIN:  Are you having pain? Yes: NPRS scale: 5/10 Pain location: base of R side of neck, up side of head, into center of forehead Pain description: dull Aggravating factors: standing and bending over to type at work Relieving factors: ibuprofen helps, Tylenol wears off fast  PRECAUTIONS: None  WEIGHT BEARING RESTRICTIONS: No  FALLS:  Has patient fallen in last 6 months? 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 Shearon Stalls 03/12/23  OBJECTIVE:   DIAGNOSTIC FINDINGS:  Cervical Spine MRI 01/12/2023 IMPRESSION: 1. Small disc protrusions at C4-C5 and C5-C6 contacting the  ventral cord. No stenosis or impingement.  PATIENT SURVEYS:  Headache Disability Index: 52 points  COGNITION: Overall cognitive status: Within functional limits for tasks assessed  SENSATION: Can have N/T down into arms, usually sleeps on L side so has numbness down this side Light touch sensation WFL No numbness with median, ulnar, or radial nerve tensioners  POSTURE: rounded shoulders and forward head  PALPATION: TTP in B upper traps and paraspinals   CERVICAL ROM:   Active ROM A/PROM (deg) Eval 03/12/23  Flexion 30  Extension 40 (pain/tight L side of neck)  Right lateral flexion 45  (pain in L side of neck)  Left lateral flexion 45  (pain in R side, more than on L side)  Right rotation 35  Left rotation 30 (pain/tight on R side of neck)   (Blank rows = not tested)  UPPER EXTREMITY ROM:  Active ROM Right eval Left eval  Shoulder flexion [facility]  Shoulder extension    Shoulder abduction [facility] WFL (feels tightness in back of shoulder)  Shoulder adduction    Shoulder extension    Shoulder internal rotation    Shoulder external rotation    Elbow flexion [facility] WFL  Elbow extension [facility] WFL  Wrist flexion    Wrist extension    Wrist ulnar deviation    Wrist radial deviation    Wrist pronation    Wrist supination     (Blank rows = not tested)  UPPER EXTREMITY MMT:  MMT Right eval Left eval  Shoulder flexion 5 5  Shoulder extension    Shoulder abduction 4 4 (pain in back of shoulder)  Shoulder adduction    Shoulder extension    Shoulder internal rotation    Shoulder external rotation    Middle trapezius    Lower trapezius    Elbow flexion 5 5  Elbow extension 5 5  Wrist flexion    Wrist extension    Wrist ulnar deviation    Wrist radial deviation    Wrist pronation    Wrist supination    Grip strength     (Blank rows = not tested)  CERVICAL SPECIAL TESTS:  Upper limb  tension test (ULTT): Negative and Distraction test: Positive   TODAY'S  TREATMENT:                                                                                                                               TherAct Trigger Point Dry-Needling  Treatment instructions: Expect mild to moderate muscle soreness. S/S of pneumothorax if dry needled over a lung field, and to seek immediate medical attention should they occur. Patient verbalized understanding of these instructions and education.  Patient Consent Given: Yes Education handout provided: Yes Muscles treated: L suboccipitals, L and R upper trap (Lupper trap sensitive), R and L splenius capitus (very sensitive) Treatment response/outcome: deep ache/muscle cramp; muscle twitch detected; most sensitivity in L UT and B splenius muscles this session  Demonstrated use of theracane on L UT muscle and periscapular musculature and pt able to perform return demonstration during therapy session. Provided handout for where to purchase theracane.  PATIENT EDUCATION:  Education details: continue HEP, use of theracane and where to purchase Person educated: Patient Education method: Explanation, Demonstration, and Handouts Education comprehension: verbalized understanding, returned demonstration, and needs further education  HOME EXERCISE PROGRAM: Access Code: [email or web address] URL: [email or web address] Date: 03/12/2023 Prepared by: Peter Congo  Exercises - Seated Upper Trapezius Stretch  - 1 x daily - 7 x weekly - 1 sets - 5-10 reps - 30 sec hold - Gentle Levator Scapulae Stretch  - 1 x daily - 7 x weekly - 1 sets - 04-1009 reps - 30 sec hold - Supine Suboccipital Release with Tennis Balls  - 1 x daily - 7 x weekly - 1 sets - 1 reps - 5-10 minutes hold - Supine Cervical Retraction with Towel  - 1 x daily - 7 x weekly - 3 sets - 10 reps - Seated Assisted Cervical Rotation with Towel  - 1 x daily - 7 x weekly - 1 sets - 5 reps - 30 sec hold  ASSESSMENT:  CLINICAL IMPRESSION: Emphasis of skilled PT session  on performing TPDN and demonstrating use of theracane to address ongoing tight musculature leading to pain and headaches and overall impaired function. Pt with increased sensitivity in her L UT and splenius muscles this date. Pt barely able to tolerate TPDN to splenius muscles with onset of pain/muscle twitch with needle barely inserted into the skin. Pt also with notable significant tightness in her L UT as compared to her R UT. Pt reports relief of pain and symptoms following treatment this date. Pt continues to benefit from skilled therapy services to work towards management of her symptoms and LTGs. Continue POC.   OBJECTIVE IMPAIRMENTS: decreased activity tolerance, decreased knowledge of condition, decreased ROM, decreased strength, increased fascial restrictions, impaired perceived functional ability, postural dysfunction, and pain.   ACTIVITY LIMITATIONS: carrying, lifting, bending, and standing  PARTICIPATION LIMITATIONS: interpersonal relationship, community activity, and occupation  PERSONAL FACTORS: Time since onset of injury/illness/exacerbation and 1-2 comorbidities:    anxiety, arm  DVT, chicken pox, hypertension, and sickle cell anemiaare also affecting patient's functional outcome.   REHAB POTENTIAL: Good  CLINICAL DECISION MAKING: Stable/uncomplicated  EVALUATION COMPLEXITY: Low   GOALS: Goals reviewed with patient? Yes  SHORT TERM GOALS=LONG TERM GOALS due to length of POC   LONG TERM GOALS: Target date: 04/09/2023   Pt will be independent with final HEP for improved strength, balance, transfers and gait. Baseline: initiated 3/25 Goal status: INITIAL  2.  Pt will improve score on Headache Disability Index to 40 points to demonstrate decreased disability level Baseline: 52 points (3/25) Goal status: INITIAL  3.  Pt will improve her cervical ROM by 10 degrees in lacking directions to demonstrate improved mobility for flexion, extension, and L/R rotation Baseline: 30  deg flexion/40 deg extension/35 deg R rotation/30 deg L rotation (3/25) Goal status: INITIAL    PLAN:  PT FREQUENCY: 1x/week  PT DURATION: 4 weeks (only 2 visits scheduled initially due to high copay)  PLANNED INTERVENTIONS: Therapeutic exercises, Therapeutic activity, Neuromuscular re-education, Balance training, Gait training, Patient/Family education, Self Care, Joint mobilization, Vestibular training, Canalith repositioning, Visual/preceptual remediation/compensation, Dry Needling, Spinal manipulation, Spinal mobilization, Cryotherapy, Moist heat, Taping, Manual therapy, and Re-evaluation  PLAN FOR NEXT SESSION: perform TPDN to suboccipitals, UT, paraspinals; add to HEP: cervical retraction and chin tucks, wall angels, windmill stretch, review HEP as UT stretch causing dizziness? assess LTG and recert next visit to cover final visit   Peter Congo, PT, DPT, CSRS 04/06/2023, 1:59 PM

## 2023-04-13 ENCOUNTER — Other Ambulatory Visit (HOSPITAL_COMMUNITY): Payer: Self-pay

## 2023-04-18 ENCOUNTER — Encounter: Payer: Self-pay | Admitting: Neurology

## 2023-04-20 ENCOUNTER — Ambulatory Visit: Payer: 59 | Attending: Physical Medicine and Rehabilitation | Admitting: Physical Therapy

## 2023-04-24 ENCOUNTER — Other Ambulatory Visit (HOSPITAL_COMMUNITY): Payer: Self-pay

## 2023-04-25 ENCOUNTER — Other Ambulatory Visit (HOSPITAL_COMMUNITY): Payer: Self-pay

## 2023-04-25 ENCOUNTER — Telehealth: Payer: Self-pay | Admitting: Pharmacy Technician

## 2023-04-25 NOTE — Telephone Encounter (Signed)
Patient Advocate Encounter   Received notification that prior authorization for Botox 200UNIT solution is required.   PA submitted on 04/25/2023 Key [id number] Insurance [employer] ePA Form Status is pending

## 2023-04-25 NOTE — Telephone Encounter (Signed)
Status of PA in separate encounter 

## 2023-04-26 ENCOUNTER — Other Ambulatory Visit (HOSPITAL_COMMUNITY): Payer: Self-pay

## 2023-04-30 ENCOUNTER — Telehealth: Payer: Self-pay

## 2023-04-30 ENCOUNTER — Other Ambulatory Visit (HOSPITAL_COMMUNITY): Payer: Self-pay

## 2023-04-30 ENCOUNTER — Other Ambulatory Visit: Payer: Self-pay | Admitting: Neurology

## 2023-04-30 MED ORDER — GABAPENTIN 300 MG PO CAPS
900.0000 mg | ORAL_CAPSULE | Freq: Every day | ORAL | 5 refills | Status: DC
  Filled 2023-04-30 (×2): qty 90, 30d supply, fill #0

## 2023-04-30 NOTE — Telephone Encounter (Signed)
Prescription for gabapentin 900mg  at bedtime sent to [facility].  LMOVM for patient.

## 2023-04-30 NOTE — Telephone Encounter (Signed)
Advised patient of PA pending.  Please advise of Status

## 2023-04-30 NOTE — Telephone Encounter (Signed)
Patient wanted to know if she could increase her Gabapentin to 3 cap at bedtime?

## 2023-05-01 ENCOUNTER — Other Ambulatory Visit (HOSPITAL_COMMUNITY): Payer: Self-pay

## 2023-05-02 ENCOUNTER — Ambulatory Visit: Payer: 59 | Admitting: Physical Therapy

## 2023-05-02 ENCOUNTER — Telehealth: Payer: Self-pay | Admitting: Physical Therapy

## 2023-05-03 ENCOUNTER — Telehealth: Payer: Self-pay | Admitting: Neurology

## 2023-05-03 NOTE — Telephone Encounter (Signed)
Patient states that she got a call that states a RX was called in but she has questions about it  please call

## 2023-05-03 NOTE — Telephone Encounter (Signed)
LMOVM for patient Rita Harrison 900 mg sent to [facility].

## 2023-05-03 NOTE — Telephone Encounter (Signed)
Mailbox full unable to Leave a VM.  Gabapentin 900 mg called into the pharmacy.

## 2023-05-06 ENCOUNTER — Other Ambulatory Visit: Payer: Self-pay | Admitting: Internal Medicine

## 2023-05-06 DIAGNOSIS — I10 Essential (primary) hypertension: Secondary | ICD-10-CM

## 2023-05-07 ENCOUNTER — Other Ambulatory Visit (HOSPITAL_COMMUNITY): Payer: Self-pay

## 2023-05-07 ENCOUNTER — Telehealth: Payer: Self-pay | Admitting: Neurology

## 2023-05-07 DIAGNOSIS — G43709 Chronic migraine without aura, not intractable, without status migrainosus: Secondary | ICD-10-CM

## 2023-05-07 MED ORDER — TRIAMTERENE-HCTZ 37.5-25 MG PO CAPS
1.0000 | ORAL_CAPSULE | Freq: Every day | ORAL | 0 refills | Status: DC
  Filled 2023-05-07: qty 90, 90d supply, fill #0

## 2023-05-07 NOTE — Telephone Encounter (Signed)
Pt called in stating the Botox was approved. She is wondering what to do from here?

## 2023-05-07 NOTE — Telephone Encounter (Signed)
PA has been APPROVED from 04/26/2023-10/23/2023

## 2023-05-08 ENCOUNTER — Other Ambulatory Visit (HOSPITAL_COMMUNITY): Payer: Self-pay

## 2023-05-08 ENCOUNTER — Other Ambulatory Visit: Payer: Self-pay

## 2023-05-08 MED ORDER — ONABOTULINUMTOXINA 200 UNITS IJ SOLR
200.0000 [IU] | INTRAMUSCULAR | 4 refills | Status: DC
  Filled 2023-05-08 (×2): qty 1, 90d supply, fill #0
  Filled 2023-09-04: qty 1, 90d supply, fill #1
  Filled 2023-11-22: qty 1, 90d supply, fill #2
  Filled 2024-02-18: qty 1, 90d supply, fill #3

## 2023-05-08 NOTE — Telephone Encounter (Signed)
PA has been APPROVED from 04/26/2023-10/23/2023.  LMOVM for patient, we are waiting on the PA team with the specialty pharmacy information once I received that I will let her know.    Spoke to [place] team patient can use [facility].

## 2023-05-08 NOTE — Addendum Note (Signed)
Addended by: Leida Lauth on: 05/08/2023 03:16 PM   Modules accepted: Orders

## 2023-05-09 ENCOUNTER — Telehealth: Payer: Self-pay | Admitting: Neurology

## 2023-05-09 ENCOUNTER — Other Ambulatory Visit (HOSPITAL_COMMUNITY): Payer: Self-pay

## 2023-05-09 NOTE — Telephone Encounter (Signed)
Patient left a message with the AN, would like sheena to call her today. They have been talking via [employer]

## 2023-05-09 NOTE — Telephone Encounter (Signed)
Per Patient her Botox should be delivered by 5/30.   Appt scheduled for 06/08/23.

## 2023-05-11 ENCOUNTER — Other Ambulatory Visit (HOSPITAL_COMMUNITY): Payer: Self-pay

## 2023-05-15 ENCOUNTER — Other Ambulatory Visit: Payer: Self-pay

## 2023-05-15 ENCOUNTER — Other Ambulatory Visit (HOSPITAL_COMMUNITY): Payer: Self-pay

## 2023-05-16 ENCOUNTER — Encounter: Payer: Self-pay | Admitting: Internal Medicine

## 2023-05-18 ENCOUNTER — Other Ambulatory Visit: Payer: Self-pay

## 2023-05-18 ENCOUNTER — Other Ambulatory Visit: Payer: Self-pay | Admitting: Internal Medicine

## 2023-05-18 ENCOUNTER — Other Ambulatory Visit (HOSPITAL_COMMUNITY): Payer: Self-pay

## 2023-05-18 DIAGNOSIS — R400 Somnolence: Secondary | ICD-10-CM

## 2023-05-18 MED ORDER — MODAFINIL 100 MG PO TABS
100.0000 mg | ORAL_TABLET | Freq: Every day | ORAL | 0 refills | Status: DC
Start: 2023-05-18 — End: 2024-04-17
  Filled 2023-05-18 (×2): qty 90, 90d supply, fill #0

## 2023-05-22 ENCOUNTER — Ambulatory Visit: Payer: 59 | Admitting: Internal Medicine

## 2023-05-25 ENCOUNTER — Ambulatory Visit: Payer: 59 | Attending: Physical Medicine and Rehabilitation | Admitting: Physical Therapy

## 2023-05-25 DIAGNOSIS — M5412 Radiculopathy, cervical region: Secondary | ICD-10-CM | POA: Diagnosis not present

## 2023-05-25 DIAGNOSIS — M542 Cervicalgia: Secondary | ICD-10-CM | POA: Diagnosis not present

## 2023-05-25 DIAGNOSIS — M5481 Occipital neuralgia: Secondary | ICD-10-CM | POA: Diagnosis not present

## 2023-05-25 DIAGNOSIS — M6281 Muscle weakness (generalized): Secondary | ICD-10-CM | POA: Insufficient documentation

## 2023-05-25 NOTE — Therapy (Signed)
OUTPATIENT PHYSICAL THERAPY CERVICAL TREATMENT-RECERTIFICATION*** AND DISCHARGE NOTE***   Patient Name: Rita Harrison MRN: [id number] DOB:[birth year], 42 y.o., female Today's Date: 05/25/2023  PHYSICAL THERAPY DISCHARGE SUMMARY  Visits from Start of Care: 4  Current functional level related to goals / functional outcomes: ***   Remaining deficits: ***   Education / Equipment: ***   Patient agrees to discharge. Patient goals were {OP Goals:25702::"met"}. Patient is being discharged due to {OP Discharge Reasons:25703::"meeting the stated rehab goals."}   END OF SESSION:  PT End of Session - 05/25/23 1318     Visit Number 4    Number of Visits 5   with eval   Date for PT Re-Evaluation 05/25/23    Authorization Type [employer] ([employer] Employee)    PT Start Time [phone]    PT Stop Time 1331   d/c   PT Time Calculation (min) 14 min    Activity Tolerance Patient tolerated treatment well    Behavior During Therapy WFL for tasks assessed/performed                Past Medical History:  Diagnosis Date   Anxiety    Arm DVT (deep venous thromboembolism), chronic (HCC)    RUE   Chicken pox    Encounter for general adult medical examination with abnormal findings 11/09/2020   Hypertension    Sickle cell anemia (HCC)    sickle cell trait   No past surgical history on file. Patient Active Problem List   Diagnosis Date Noted   Occipital neuralgia of right side 02/06/2023   Cervicalgia 02/06/2023   Uncontrolled daytime somnolence 01/27/2023   Protrusion of cervical intervertebral disc 01/23/2023   Stage 3a chronic kidney disease (HCC) 01/23/2023   Chronic hypokalemia 11/29/2022   Intractable hemiplegic migraine with status migrainosus 10/16/2022   Primary hypertension 10/16/2022   Gastroesophageal reflux disease 09/08/2022   Seborrheic dermatitis of scalp 03/02/2022   Hyperlipidemia with target LDL less than 130 11/19/2020   Vitamin D deficiency disease  11/11/2020   Encounter for general adult medical examination with abnormal findings 11/09/2020   Hammer toes, bilateral 03/14/2019    PCP: Etta Grandchild, MD  REFERRING PROVIDER: Angelina Sheriff, DO  REFERRING DIAG: [phone] (ICD-10-CM) - Occipital neuralgia of right side M54.2 (ICD-10-CM) - Cervicalgia G44.021 (ICD-10-CM) - Intractable chronic cluster headache M50.20 (ICD-10-CM) - Protrusion of cervical intervertebral disc  THERAPY DIAG:  Muscle weakness (generalized)  Bilateral occipital neuralgia  Cervicalgia  Radiculopathy, cervical region  Rationale for Evaluation and Treatment: Rehabilitation  ONSET DATE: 02/05/2023 (referall); symptoms started in August 2023  SUBJECTIVE:  SUBJECTIVE STATEMENT: ***Pt reports that her symptoms have become a whole lot better. Pt reports that she does continue to have headaches every day but  she doesn't have to take meds until mid-evening whereas before she was having to take something about 3 times per day. Pt also states that her neck doesn't feel as tight as  Pt states she has Botox scheduled for 6/21 with Dr. Everlena Cooper.  Doesn't feel like neck is tight like it used to be, has been working out, stretching, doing the treadmill  Hand dominance: Right  PERTINENT HISTORY:  anxiety, arm DVT, chicken pox, hypertension, and sickle cell anemia  PAIN:  Are you having pain? Yes: NPRS scale: 5/10 Pain location: base of R side of neck, up side of head, into center of forehead Pain description: dull Aggravating factors: standing and bending over to type at work Relieving factors: ibuprofen helps, Tylenol wears off fast  PRECAUTIONS: None  WEIGHT BEARING RESTRICTIONS: No  FALLS:  Has patient fallen in last 6 months? 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 Shearon Stalls 03/12/23  OBJECTIVE:   DIAGNOSTIC FINDINGS:  Cervical Spine MRI 01/12/2023 IMPRESSION: 1. Small disc protrusions at C4-C5 and C5-C6 contacting the ventral cord. No stenosis or impingement.  PATIENT SURVEYS:  Headache Disability Index: 52 points  COGNITION: Overall cognitive status: Within functional limits for tasks assessed  SENSATION: Can have N/T down into arms, usually sleeps on L side so has numbness down this side Light touch sensation WFL No numbness with median, ulnar, or radial nerve tensioners  POSTURE: rounded shoulders and forward head  PALPATION: TTP in B upper traps and paraspinals   CERVICAL ROM:   Active ROM A/PROM (deg) Eval 03/12/23 AROM (d/c) 05/25/23  Flexion 30 40  Extension 40 (pain/tight L side of neck) 40  Right lateral flexion 45  (pain in L side of neck) 40  Left lateral flexion 45  (pain in R side, more than on L side) 38  Right rotation 35 59  Left rotation 30 (pain/tight on R side of neck) 65   (Blank rows = not tested)  UPPER EXTREMITY ROM:  Active ROM Right eval Left eval  Shoulder flexion [facility]  Shoulder extension    Shoulder abduction [facility] WFL (feels tightness in back of shoulder)  Shoulder adduction    Shoulder extension    Shoulder internal rotation    Shoulder external rotation    Elbow flexion [facility] WFL  Elbow extension [facility] WFL  Wrist flexion    Wrist extension    Wrist ulnar deviation    Wrist radial deviation    Wrist pronation    Wrist supination     (Blank rows = not tested)  UPPER EXTREMITY MMT:  MMT Right eval Left eval  Shoulder flexion 5 5  Shoulder extension     Shoulder abduction 4 4 (pain in back of shoulder)  Shoulder adduction    Shoulder extension    Shoulder internal rotation    Shoulder external rotation    Middle trapezius    Lower trapezius    Elbow flexion 5 5  Elbow extension 5 5  Wrist flexion    Wrist extension  Wrist ulnar deviation    Wrist radial deviation    Wrist pronation    Wrist supination    Grip strength     (Blank rows = not tested)  CERVICAL SPECIAL TESTS:  Upper limb tension test (ULTT): Negative and Distraction test: Positive   TODAY'S TREATMENT:                                                                                                                               TherAct Trigger Point Dry-Needling  Treatment instructions: Expect mild to moderate muscle soreness. S/S of pneumothorax if dry needled over a lung field, and to seek immediate medical attention should they occur. Patient verbalized understanding of these instructions and education.  Patient Consent Given: Yes Education handout provided: Yes Muscles treated: L suboccipitals, L and R upper trap (Lupper trap sensitive), R and L splenius capitus (very sensitive)*** Treatment response/outcome: deep ache/muscle cramp; muscle twitch detected; most sensitivity in L UT and B splenius muscles this session  Demonstrated use of theracane on L UT muscle and periscapular musculature and pt able to perform return demonstration during therapy session. Provided handout for where to purchase theracane.  PATIENT EDUCATION:  Education details: continue HEP, use of theracane and where to purchase*** Person educated: Patient Education method: Explanation, Demonstration, and Handouts Education comprehension: verbalized understanding, returned demonstration, and needs further education  HOME EXERCISE PROGRAM: Access Code: [email or web address] URL: [email or web address] Date: 03/12/2023 Prepared by: Peter Congo  Exercises - Seated Upper Trapezius  Stretch  - 1 x daily - 7 x weekly - 1 sets - 5-10 reps - 30 sec hold - Gentle Levator Scapulae Stretch  - 1 x daily - 7 x weekly - 1 sets - 04-1009 reps - 30 sec hold - Supine Suboccipital Release with Tennis Balls  - 1 x daily - 7 x weekly - 1 sets - 1 reps - 5-10 minutes hold - Supine Cervical Retraction with Towel  - 1 x daily - 7 x weekly - 3 sets - 10 reps - Seated Assisted Cervical Rotation with Towel  - 1 x daily - 7 x weekly - 1 sets - 5 reps - 30 sec hold    ASSESSMENT:  CLINICAL IMPRESSION: Emphasis of skilled PT session on *** Continue POC.   OBJECTIVE IMPAIRMENTS: decreased activity tolerance, decreased knowledge of condition, decreased ROM, decreased strength, increased fascial restrictions, impaired perceived functional ability, postural dysfunction, and pain.   ACTIVITY LIMITATIONS: carrying, lifting, bending, and standing  PARTICIPATION LIMITATIONS: interpersonal relationship, community activity, and occupation  PERSONAL FACTORS: Time since onset of injury/illness/exacerbation and 1-2 comorbidities:    anxiety, arm DVT, chicken pox, hypertension, and sickle cell anemiaare also affecting patient's functional outcome.   REHAB POTENTIAL: Good  CLINICAL DECISION MAKING: Stable/uncomplicated  EVALUATION COMPLEXITY: Low   GOALS: Goals reviewed with patient? Yes  SHORT TERM GOALS=LONG TERM GOALS due to length of POC   LONG TERM GOALS: Target date: 04/09/2023 ***  Pt  will be independent with final HEP for improved strength, balance, transfers and gait. Baseline: initiated 3/25 Goal status: INITIAL  2.  Pt will improve score on Headache Disability Index to 40 points to demonstrate decreased disability level Baseline: 52 points (3/25) Goal status: INITIAL  3.  Pt will improve her cervical ROM by 10 degrees in lacking directions to demonstrate improved mobility for flexion, extension, and L/R rotation Baseline: 30 deg flexion/40 deg extension/35 deg R rotation/30 deg  L rotation (3/25) Goal status: INITIAL    PLAN:  PT FREQUENCY: 1x/week  PT DURATION: 4 weeks (only 2 visits scheduled initially due to high copay)  PLANNED INTERVENTIONS: Therapeutic exercises, Therapeutic activity, Neuromuscular re-education, Balance training, Gait training, Patient/Family education, Self Care, Joint mobilization, Vestibular training, Canalith repositioning, Visual/preceptual remediation/compensation, Dry Needling, Spinal manipulation, Spinal mobilization, Cryotherapy, Moist heat, Taping, Manual therapy, and Re-evaluation  PLAN FOR NEXT SESSION: perform TPDN to suboccipitals, UT, paraspinals; add to HEP: cervical retraction and chin tucks, wall angels, windmill stretch, review HEP as UT stretch causing dizziness? assess LTG and recert next visit to cover final visit***   Peter Congo, PT, DPT, CSRS 05/25/2023, 1:35 PM

## 2023-05-28 ENCOUNTER — Other Ambulatory Visit (HOSPITAL_COMMUNITY): Payer: Self-pay

## 2023-06-05 ENCOUNTER — Encounter: Payer: Self-pay | Admitting: Internal Medicine

## 2023-06-05 ENCOUNTER — Other Ambulatory Visit: Payer: Self-pay | Admitting: Internal Medicine

## 2023-06-05 ENCOUNTER — Other Ambulatory Visit (HOSPITAL_COMMUNITY): Payer: Self-pay

## 2023-06-05 DIAGNOSIS — G43411 Hemiplegic migraine, intractable, with status migrainosus: Secondary | ICD-10-CM

## 2023-06-05 MED ORDER — PROCHLORPERAZINE MALEATE 5 MG PO TABS
5.0000 mg | ORAL_TABLET | Freq: Four times a day (QID) | ORAL | 0 refills | Status: DC | PRN
Start: 2023-06-05 — End: 2024-02-27
  Filled 2023-06-05: qty 30, 8d supply, fill #0

## 2023-06-08 ENCOUNTER — Ambulatory Visit: Payer: 59 | Admitting: Neurology

## 2023-06-08 DIAGNOSIS — G43709 Chronic migraine without aura, not intractable, without status migrainosus: Secondary | ICD-10-CM

## 2023-06-08 MED ORDER — ONABOTULINUMTOXINA 100 UNITS IJ SOLR
200.0000 [IU] | Freq: Once | INTRAMUSCULAR | Status: AC
Start: 2023-06-08 — End: 2023-06-08
  Administered 2023-06-08: 155 [IU] via INTRAMUSCULAR

## 2023-06-11 ENCOUNTER — Other Ambulatory Visit (HOSPITAL_COMMUNITY): Payer: Self-pay

## 2023-06-25 ENCOUNTER — Other Ambulatory Visit: Payer: Self-pay | Admitting: Internal Medicine

## 2023-06-25 DIAGNOSIS — N6310 Unspecified lump in the right breast, unspecified quadrant: Secondary | ICD-10-CM

## 2023-06-27 ENCOUNTER — Other Ambulatory Visit (HOSPITAL_COMMUNITY): Payer: Self-pay

## 2023-07-03 ENCOUNTER — Other Ambulatory Visit: Payer: Self-pay | Admitting: Internal Medicine

## 2023-07-03 ENCOUNTER — Other Ambulatory Visit (HOSPITAL_COMMUNITY): Payer: Self-pay

## 2023-07-03 ENCOUNTER — Encounter: Payer: Self-pay | Admitting: Internal Medicine

## 2023-07-03 ENCOUNTER — Encounter: Payer: Self-pay | Admitting: Neurology

## 2023-07-03 DIAGNOSIS — I10 Essential (primary) hypertension: Secondary | ICD-10-CM

## 2023-07-03 MED ORDER — AMLODIPINE BESYLATE 5 MG PO TABS
5.0000 mg | ORAL_TABLET | Freq: Every day | ORAL | 0 refills | Status: DC
Start: 2023-07-03 — End: 2023-11-28
  Filled 2023-07-03: qty 90, 90d supply, fill #0

## 2023-07-10 ENCOUNTER — Ambulatory Visit (INDEPENDENT_AMBULATORY_CARE_PROVIDER_SITE_OTHER): Payer: 59 | Admitting: Podiatry

## 2023-07-10 ENCOUNTER — Other Ambulatory Visit (HOSPITAL_COMMUNITY): Payer: Self-pay

## 2023-07-10 DIAGNOSIS — B351 Tinea unguium: Secondary | ICD-10-CM | POA: Diagnosis not present

## 2023-07-10 DIAGNOSIS — L600 Ingrowing nail: Secondary | ICD-10-CM

## 2023-07-10 NOTE — Progress Notes (Signed)
Subjective: Chief Complaint  Patient presents with   Ingrown Toenail    Pt  came in today for left great toenail ingrown     42 year old female presents with above concerns.  Hurts to wear shoes. No drainage.  No injuries.  Objective: AAO x3, NAD DP/PT pulses palpable bilaterally, CRT less than 3 seconds Left hallux nail high-pressure, dystrophic.  There is incurvation present nail borders with localized edema.  There is tenderness palpation of nail border.  No ascending cellulitis.  No open lesions. No pain with calf compression, swelling, warmth, erythema  Assessment: Ingrown toenail onychodystrophy  Plan: -All treatment options discussed with the patient including all alternatives, risks, complications.  -At this time, the patient is requesting partial nail removal with chemical matricectomy to the symptomatic portion of the nail. Risks and complications were discussed with the patient for which they understand and written consent was obtained. Under sterile conditions a total of 3 mL of a mixture of 2% lidocaine plain and 0.5% Marcaine plain was infiltrated in a hallux block fashion. Once anesthetized, the skin was prepped in sterile fashion. A tourniquet was then applied. Next the size border of the hallux nail border was then sharply excised making sure to remove the entire offending nail border. Once the nails were ensured to be removed area was debrided and the underlying skin was intact. There is no purulence identified in the procedure. Next phenol was then applied under standard conditions and copiously irrigated.  Silvadene was applied. A dry sterile dressing was applied. After application of the dressing the tourniquet was removed and there is found to be an immediate capillary refill time to the digit. The patient tolerated the procedure well any complications. Post procedure instructions were discussed the patient for which he verbally understood. Discussed signs/symptoms of  infection and directed to call the office immediately should any occur or go directly to the emergency room. In the meantime, encouraged to call the office with any questions, concerns, changes symptoms. -Nail was sent for culture -Patient encouraged to call the office with any questions, concerns, change in symptoms.

## 2023-07-10 NOTE — Patient Instructions (Signed)

## 2023-07-17 ENCOUNTER — Encounter: Payer: Self-pay | Admitting: Podiatry

## 2023-07-18 ENCOUNTER — Encounter (INDEPENDENT_AMBULATORY_CARE_PROVIDER_SITE_OTHER): Payer: Self-pay

## 2023-07-18 DIAGNOSIS — Z113 Encounter for screening for infections with a predominantly sexual mode of transmission: Secondary | ICD-10-CM | POA: Diagnosis not present

## 2023-07-18 DIAGNOSIS — Z309 Encounter for contraceptive management, unspecified: Secondary | ICD-10-CM | POA: Diagnosis not present

## 2023-07-18 DIAGNOSIS — Z803 Family history of malignant neoplasm of breast: Secondary | ICD-10-CM | POA: Diagnosis not present

## 2023-07-18 DIAGNOSIS — Z8051 Family history of malignant neoplasm of kidney: Secondary | ICD-10-CM | POA: Diagnosis not present

## 2023-07-18 DIAGNOSIS — Z780 Asymptomatic menopausal state: Secondary | ICD-10-CM | POA: Diagnosis not present

## 2023-07-18 DIAGNOSIS — Z6832 Body mass index (BMI) 32.0-32.9, adult: Secondary | ICD-10-CM | POA: Diagnosis not present

## 2023-07-18 DIAGNOSIS — Z801 Family history of malignant neoplasm of trachea, bronchus and lung: Secondary | ICD-10-CM | POA: Diagnosis not present

## 2023-07-18 DIAGNOSIS — Z800 Family history of malignant neoplasm of digestive organs: Secondary | ICD-10-CM | POA: Diagnosis not present

## 2023-07-18 DIAGNOSIS — Z01419 Encounter for gynecological examination (general) (routine) without abnormal findings: Secondary | ICD-10-CM | POA: Diagnosis not present

## 2023-07-19 ENCOUNTER — Other Ambulatory Visit: Payer: Self-pay | Admitting: Podiatry

## 2023-07-19 DIAGNOSIS — Z79899 Other long term (current) drug therapy: Secondary | ICD-10-CM

## 2023-07-20 DIAGNOSIS — Z79899 Other long term (current) drug therapy: Secondary | ICD-10-CM | POA: Diagnosis not present

## 2023-07-23 DIAGNOSIS — R87619 Unspecified abnormal cytological findings in specimens from cervix uteri: Secondary | ICD-10-CM | POA: Insufficient documentation

## 2023-07-24 ENCOUNTER — Other Ambulatory Visit: Payer: Self-pay | Admitting: Podiatry

## 2023-07-24 ENCOUNTER — Other Ambulatory Visit (HOSPITAL_COMMUNITY): Payer: Self-pay

## 2023-07-24 DIAGNOSIS — R87610 Atypical squamous cells of undetermined significance on cytologic smear of cervix (ASC-US): Secondary | ICD-10-CM | POA: Diagnosis not present

## 2023-07-24 DIAGNOSIS — Z79899 Other long term (current) drug therapy: Secondary | ICD-10-CM

## 2023-07-24 DIAGNOSIS — Z3202 Encounter for pregnancy test, result negative: Secondary | ICD-10-CM | POA: Diagnosis not present

## 2023-07-24 MED ORDER — TERBINAFINE HCL 250 MG PO TABS
250.0000 mg | ORAL_TABLET | Freq: Every day | ORAL | 0 refills | Status: DC
  Filled 2023-07-24: qty 90, 90d supply, fill #0

## 2023-07-25 ENCOUNTER — Other Ambulatory Visit (HOSPITAL_COMMUNITY): Payer: Self-pay

## 2023-07-26 LAB — HM PAP SMEAR

## 2023-07-31 ENCOUNTER — Other Ambulatory Visit (HOSPITAL_COMMUNITY): Payer: Self-pay

## 2023-07-31 DIAGNOSIS — L239 Allergic contact dermatitis, unspecified cause: Secondary | ICD-10-CM | POA: Diagnosis not present

## 2023-07-31 DIAGNOSIS — L218 Other seborrheic dermatitis: Secondary | ICD-10-CM | POA: Diagnosis not present

## 2023-07-31 MED ORDER — HYDROCORTISONE 2.5 % EX CREA
1.0000 | TOPICAL_CREAM | Freq: Two times a day (BID) | CUTANEOUS | 2 refills | Status: DC
  Filled 2023-07-31: qty 30, 30d supply, fill #0
  Filled 2024-01-16: qty 30, 30d supply, fill #1

## 2023-08-01 ENCOUNTER — Ambulatory Visit: Payer: 59 | Admitting: Internal Medicine

## 2023-08-03 ENCOUNTER — Ambulatory Visit
Admission: RE | Admit: 2023-08-03 | Discharge: 2023-08-03 | Disposition: A | Discharge status: Home / Self Care | Payer: 59 | Source: Ambulatory Visit | Attending: Internal Medicine | Admitting: Internal Medicine

## 2023-08-03 ENCOUNTER — Ambulatory Visit: Admission: RE | Admit: 2023-08-03 | Payer: 59 | Source: Ambulatory Visit

## 2023-08-03 DIAGNOSIS — N6310 Unspecified lump in the right breast, unspecified quadrant: Secondary | ICD-10-CM | POA: Diagnosis not present

## 2023-08-03 DIAGNOSIS — N6320 Unspecified lump in the left breast, unspecified quadrant: Secondary | ICD-10-CM | POA: Diagnosis not present

## 2023-08-03 DIAGNOSIS — N6315 Unspecified lump in the right breast, overlapping quadrants: Secondary | ICD-10-CM | POA: Diagnosis not present

## 2023-08-07 ENCOUNTER — Telehealth: Payer: Self-pay | Admitting: Internal Medicine

## 2023-08-07 NOTE — Telephone Encounter (Signed)
We have received FMLA forms through the fax to be filled out and they have been placed in the providers box.   Please fax to: [phone]

## 2023-08-07 NOTE — Telephone Encounter (Signed)
Forms have been completed and given to PCP to review and sign.  ?

## 2023-08-08 NOTE — Telephone Encounter (Signed)
Forms have been signed and faxed back.   Copy sent to charge Original filed with [facility]

## 2023-08-09 ENCOUNTER — Encounter: Payer: Self-pay | Admitting: Internal Medicine

## 2023-08-09 ENCOUNTER — Ambulatory Visit (INDEPENDENT_AMBULATORY_CARE_PROVIDER_SITE_OTHER): Payer: 59 | Admitting: Internal Medicine

## 2023-08-09 VITALS — BP 132/76 | HR 62 | Temp 98.5°F | Resp 16 | Ht 66.0 in | Wt 208.8 lb

## 2023-08-09 DIAGNOSIS — Z114 Encounter for screening for human immunodeficiency virus [HIV]: Secondary | ICD-10-CM

## 2023-08-09 DIAGNOSIS — Z0000 Encounter for general adult medical examination without abnormal findings: Secondary | ICD-10-CM | POA: Diagnosis not present

## 2023-08-09 DIAGNOSIS — I10 Essential (primary) hypertension: Secondary | ICD-10-CM

## 2023-08-09 DIAGNOSIS — E876 Hypokalemia: Secondary | ICD-10-CM | POA: Diagnosis not present

## 2023-08-09 DIAGNOSIS — Z1159 Encounter for screening for other viral diseases: Secondary | ICD-10-CM

## 2023-08-09 DIAGNOSIS — Z0001 Encounter for general adult medical examination with abnormal findings: Secondary | ICD-10-CM

## 2023-08-09 DIAGNOSIS — N1831 Chronic kidney disease, stage 3a: Secondary | ICD-10-CM

## 2023-08-09 NOTE — Progress Notes (Signed)
Subjective:  Patient ID: Rita Harrison, female    DOB: 06/18/1981  Age: 42 y.o. MRN: [id number]  CC: Annual Exam and Hypertension   HPI Luvena L Michel presents for a CPX and f/up ---  Discussed the use of AI scribe software for clinical note transcription with the patient, who gave verbal consent to proceed.  History of Present Illness   The patient, with a history of hypertension and migraines, reports an improvement in her headaches. She has been managing her migraines with a medication that she refills every 90 days. She also takes Compazine as needed for occasional nausea. She denies any recent symptoms of high or low blood pressure, such as blurred vision, chest pain, shortness of breath, dizziness, or lightheadedness.  She recently underwent a mammogram and a Pap smear. The Pap smear was abnormal, leading to a biopsy for suspected cervical cancer. However, the biopsy results were benign.  She has also started taking terbinafine (Lamisil) for a fungal infection following an ingrown toenail removal. She denies any side effects from the medication, such as abdominal pain, nausea, or vomiting, but reports ongoing toe pain.  The patient is a nonsmoker and nondrinker. She reports that she has not had a menstrual cycle since April and believes she is in perimenopause. She is not on any form of contraception.       Outpatient Medications Prior to Visit  Medication Sig Dispense Refill   ALPRAZolam (XANAX) 0.25 MG tablet Take 1 tablet (0.25 mg total) by mouth 2 (two) times daily as needed for anxiety. 20 tablet 3   Aluminum Chloride in Alcohol 6.25 % SOLN Apply 1 actuation topically at bedtime as directed 60 mL 3   amLODipine (NORVASC) 5 MG tablet Take 1 tablet (5 mg total) by mouth daily. 90 tablet 0   botulinum toxin Type A (BOTOX) 200 units injection Inject 200 Units into the muscle every 3 (three) months. Inject 155 units IM into multiple site in the face,neck and head once every  90 days 1 each 4   clindamycin (CLEOCIN T) 1 % lotion APPLY A THIN LAYER TO THE AFFECTED AREA(S) 2 TIMES PER DAY AS NEEDED 60 mL 1   cyclobenzaprine (FLEXERIL) 10 MG tablet Take 1 tablet (10 mg total) by mouth 3 (three) times daily as needed for muscle spasms. 90 tablet 5   Fluocinolone Acetonide Body (DERMA-SMOOTHE/FS BODY) 0.01 % OIL Apply topically at bedtime as directed. 118 mL 1   gabapentin (NEURONTIN) 300 MG capsule Take 3 capsules (900 mg total) by mouth at bedtime. 90 capsule 5   hydrocortisone 2.5 % cream Apply 1 Application topically 2 (two) times daily 1-2 weeks on/off as needed for flare 30 g 2   modafinil (PROVIGIL) 100 MG tablet Take 1 tablet (100 mg total) by mouth daily. 90 tablet 0   nebivolol (BYSTOLIC) 10 MG tablet Take 1 tablet (10 mg total) by mouth daily. 90 tablet 3   omeprazole (PRILOSEC) 20 MG capsule Take 1 capsule (20 mg total) by mouth daily. 30 capsule 1   potassium chloride (KLOR-CON M) 10 MEQ tablet Take 1 tablet (10 mEq total) by mouth 2 (two) times daily. 180 tablet 1   prochlorperazine (COMPAZINE) 5 MG tablet Take 1 tablet (5 mg) by mouth every 6 hours as needed for nausea or vomiting. 30 tablet 0   terbinafine (LAMISIL) 250 MG tablet Take 1 tablet (250 mg total) by mouth daily. 90 tablet 0   triamterene-hydrochlorothiazide (DYAZIDE) 37.5-25 MG capsule Take 1  capsule by mouth daily. 90 capsule 0   No facility-administered medications prior to visit.    ROS Review of Systems  Constitutional:  Positive for unexpected weight change (wt gain). Negative for appetite change, chills, diaphoresis and fatigue.  HENT: Negative.    Eyes: Negative.   Respiratory: Negative.  Negative for chest tightness, shortness of breath and wheezing.   Cardiovascular:  Negative for chest pain, palpitations and leg swelling.  Gastrointestinal:  Negative for abdominal pain, constipation, diarrhea, nausea and vomiting.  Genitourinary:  Positive for flank pain.  Musculoskeletal:   Negative for arthralgias, joint swelling and myalgias.  Skin: Negative.   Neurological:  Negative for dizziness, weakness and headaches.  Hematological:  Negative for adenopathy. Does not bruise/bleed easily.  Psychiatric/Behavioral: Negative.      Objective:  BP 132/76 (BP Location: Left Arm, Patient Position: Sitting, Cuff Size: Normal)   Pulse 62   Temp 98.5 F (36.9 C) (Oral)   Resp 16   Ht 5\' 6"  (1.676 m)   Wt 208 lb 12.8 oz (94.7 kg)   LMP  (LMP Unknown)   SpO2 99%   BMI 33.70 kg/m   BP Readings from Last 3 Encounters:  08/09/23 132/76  04/05/23 135/82  04/04/23 129/81    Wt Readings from Last 3 Encounters:  08/09/23 208 lb 12.8 oz (94.7 kg)  04/05/23 199 lb (90.3 kg)  04/04/23 198 lb (89.8 kg)    Physical Exam Vitals reviewed.  Constitutional:      Appearance: Normal appearance.  HENT:     Mouth/Throat:     Mouth: Mucous membranes are moist.  Eyes:     General: No scleral icterus.    Conjunctiva/sclera: Conjunctivae normal.  Cardiovascular:     Rate and Rhythm: Normal rate and regular rhythm.     Heart sounds: No murmur heard.    No gallop.  Pulmonary:     Effort: Pulmonary effort is normal.     Breath sounds: No stridor. No wheezing, rhonchi or rales.  Abdominal:     General: Abdomen is protuberant. There is no distension.     Palpations: Abdomen is soft. There is no hepatomegaly or mass.  Musculoskeletal:        General: Normal range of motion.     Cervical back: Neck supple.     Right lower leg: No edema.     Left lower leg: No edema.  Lymphadenopathy:     Cervical: No cervical adenopathy.  Skin:    General: Skin is warm and dry.  Neurological:     General: No focal deficit present.     Mental Status: She is alert.  Psychiatric:        Mood and Affect: Mood normal.        Behavior: Behavior normal.     Lab Results  Component Value Date   WBC 8.7 07/20/2023   HGB 13.6 07/20/2023   HCT 40.9 07/20/2023   PLT 285 07/20/2023   GLUCOSE  79 01/23/2023   CHOL 180 11/09/2020   TRIG 82.0 11/09/2020   HDL 41.30 11/09/2020   LDLCALC 122 (H) 11/09/2020   ALT 26 07/20/2023   AST 22 07/20/2023   NA 139 01/23/2023   K 3.6 01/23/2023   CL 104 01/23/2023   CREATININE 1.48 (H) 01/23/2023   BUN 17 01/23/2023   CO2 24 01/23/2023   TSH 0.78 01/23/2023   INR 1.08 04/23/2013   HGBA1C 5.6 02/21/2018    MM 3D DIAGNOSTIC MAMMOGRAM BILATERAL BREAST  Result  Date: 08/03/2023 CLINICAL DATA:  Final follow-up for a likely benign right breast mass. EXAM: DIGITAL DIAGNOSTIC BILATERAL MAMMOGRAM WITH TOMOSYNTHESIS AND CAD; ULTRASOUND RIGHT BREAST LIMITED TECHNIQUE: Bilateral digital diagnostic mammography and breast tomosynthesis was performed. The images were evaluated with computer-aided detection. ; Targeted ultrasound examination of the right breast was performed COMPARISON:  Previous exam(s). ACR Breast Density Category c: The breasts are heterogeneously dense, which may obscure small masses. FINDINGS: No suspicious calcifications, masses or areas of distortion are seen in the bilateral breasts. The patient has multiple bilateral oval and circumscribed benign masses, likely fibrocystic changes. Ultrasound of the right breast at 9 o'clock, 6 cm from the nipple demonstrates a stable oval hypoechoic mass measuring 4 x 4 x 4 mm, previously measuring 4 x 4 x 4 mm. IMPRESSION: 1. The right breast mass at 9 o'clock has been stable for at least 2 years, and is therefore considered to be benign. 2.  No evidence of malignancy in the bilateral breasts. RECOMMENDATION: Screening mammogram in one year.(Code:SM-B-01Y) I have discussed the findings and recommendations with the patient. If applicable, a reminder letter will be sent to the patient regarding the next appointment. BI-RADS CATEGORY  2: Benign. Electronically Signed   By: Frederico Hamman M.D.   On: 08/03/2023 14:29   [place] LIMITED ULTRASOUND INCLUDING AXILLA RIGHT BREAST  Result Date: 08/03/2023 CLINICAL  DATA:  Final follow-up for a likely benign right breast mass. EXAM: DIGITAL DIAGNOSTIC BILATERAL MAMMOGRAM WITH TOMOSYNTHESIS AND CAD; ULTRASOUND RIGHT BREAST LIMITED TECHNIQUE: Bilateral digital diagnostic mammography and breast tomosynthesis was performed. The images were evaluated with computer-aided detection. ; Targeted ultrasound examination of the right breast was performed COMPARISON:  Previous exam(s). ACR Breast Density Category c: The breasts are heterogeneously dense, which may obscure small masses. FINDINGS: No suspicious calcifications, masses or areas of distortion are seen in the bilateral breasts. The patient has multiple bilateral oval and circumscribed benign masses, likely fibrocystic changes. Ultrasound of the right breast at 9 o'clock, 6 cm from the nipple demonstrates a stable oval hypoechoic mass measuring 4 x 4 x 4 mm, previously measuring 4 x 4 x 4 mm. IMPRESSION: 1. The right breast mass at 9 o'clock has been stable for at least 2 years, and is therefore considered to be benign. 2.  No evidence of malignancy in the bilateral breasts. RECOMMENDATION: Screening mammogram in one year.(Code:SM-B-01Y) I have discussed the findings and recommendations with the patient. If applicable, a reminder letter will be sent to the patient regarding the next appointment. BI-RADS CATEGORY  2: Benign. Electronically Signed   By: Frederico Hamman M.D.   On: 08/03/2023 14:29    Assessment & Plan:   Stage 3a chronic kidney disease (HCC)-I will monitor her renal function. -     Magnesium; Future -     Basic metabolic panel; Future  Primary hypertension-her blood pressure is well-controlled.  I will monitor her electrolytes and renal function. -     Magnesium; Future -     Basic metabolic panel; Future  Chronic hypokalemia -     Magnesium; Future -     Basic metabolic panel; Future  Need for hepatitis C screening test -     Hepatitis C antibody; Future  Encounter for screening for HIV -      HIV Antibody (routine testing w rflx); Future  Encounter for general adult medical examination with abnormal findings - Exam completed, labs reviewed, vaccines reviewed and updated, cancer screenings addressed, pt ed material was given.  -  Hepatitis C antibody; Future -     HIV Antibody (routine testing w rflx); Future     Follow-up: Return in about 6 months (around 02/09/2024).  Sanda Linger, MD

## 2023-08-09 NOTE — Patient Instructions (Signed)

## 2023-08-13 ENCOUNTER — Other Ambulatory Visit (HOSPITAL_COMMUNITY): Payer: Self-pay

## 2023-08-13 ENCOUNTER — Other Ambulatory Visit: Payer: Self-pay | Admitting: Internal Medicine

## 2023-08-13 ENCOUNTER — Other Ambulatory Visit: Payer: Self-pay | Admitting: Family Medicine

## 2023-08-13 DIAGNOSIS — L219 Seborrheic dermatitis, unspecified: Secondary | ICD-10-CM

## 2023-08-13 DIAGNOSIS — K219 Gastro-esophageal reflux disease without esophagitis: Secondary | ICD-10-CM

## 2023-08-13 DIAGNOSIS — F411 Generalized anxiety disorder: Secondary | ICD-10-CM

## 2023-08-13 DIAGNOSIS — I10 Essential (primary) hypertension: Secondary | ICD-10-CM

## 2023-08-13 DIAGNOSIS — G44209 Tension-type headache, unspecified, not intractable: Secondary | ICD-10-CM

## 2023-08-13 MED ORDER — TRIAMTERENE-HCTZ 37.5-25 MG PO CAPS
1.0000 | ORAL_CAPSULE | Freq: Every day | ORAL | 0 refills | Status: DC
Start: 2023-08-13 — End: 2023-11-28
  Filled 2023-08-13: qty 90, 90d supply, fill #0

## 2023-08-13 MED ORDER — FLUOCINOLONE ACETONIDE BODY 0.01 % EX OIL
1.0000 | TOPICAL_OIL | Freq: Every day | CUTANEOUS | 1 refills | Status: DC
Start: 2023-08-13 — End: 2023-11-28
  Filled 2023-08-13: qty 118, 30d supply, fill #0
  Filled 2023-11-28: qty 118, 30d supply, fill #1

## 2023-08-13 MED ORDER — OMEPRAZOLE 20 MG PO CPDR
20.0000 mg | DELAYED_RELEASE_CAPSULE | Freq: Every day | ORAL | 1 refills | Status: DC
  Filled 2023-08-13 – 2024-02-27 (×3): qty 30, 30d supply, fill #0

## 2023-08-13 NOTE — Telephone Encounter (Signed)
Forward to DND.Marland KitchenRaechel Chute

## 2023-08-14 ENCOUNTER — Other Ambulatory Visit: Payer: Self-pay | Admitting: Internal Medicine

## 2023-08-14 ENCOUNTER — Other Ambulatory Visit (HOSPITAL_COMMUNITY): Payer: Self-pay

## 2023-08-14 DIAGNOSIS — F411 Generalized anxiety disorder: Secondary | ICD-10-CM

## 2023-08-15 DIAGNOSIS — Z9189 Other specified personal risk factors, not elsewhere classified: Secondary | ICD-10-CM | POA: Insufficient documentation

## 2023-08-15 NOTE — Telephone Encounter (Signed)
Forward to DOD.Marland KitchenRaechel Chute

## 2023-08-16 ENCOUNTER — Other Ambulatory Visit (HOSPITAL_COMMUNITY): Payer: Self-pay

## 2023-08-16 MED ORDER — ALPRAZOLAM 0.25 MG PO TABS
0.2500 mg | ORAL_TABLET | Freq: Two times a day (BID) | ORAL | 1 refills | Status: DC | PRN
  Filled 2023-08-16: qty 20, 10d supply, fill #0

## 2023-08-22 ENCOUNTER — Encounter: Payer: Self-pay | Admitting: Podiatry

## 2023-08-22 ENCOUNTER — Encounter (HOSPITAL_COMMUNITY): Payer: Self-pay

## 2023-08-22 ENCOUNTER — Other Ambulatory Visit (INDEPENDENT_AMBULATORY_CARE_PROVIDER_SITE_OTHER): Payer: 59

## 2023-08-22 ENCOUNTER — Other Ambulatory Visit (HOSPITAL_COMMUNITY): Payer: Self-pay

## 2023-08-22 DIAGNOSIS — I10 Essential (primary) hypertension: Secondary | ICD-10-CM

## 2023-08-22 DIAGNOSIS — N1831 Chronic kidney disease, stage 3a: Secondary | ICD-10-CM | POA: Diagnosis not present

## 2023-08-22 DIAGNOSIS — Z1159 Encounter for screening for other viral diseases: Secondary | ICD-10-CM | POA: Diagnosis not present

## 2023-08-22 DIAGNOSIS — Z114 Encounter for screening for human immunodeficiency virus [HIV]: Secondary | ICD-10-CM | POA: Diagnosis not present

## 2023-08-22 DIAGNOSIS — E876 Hypokalemia: Secondary | ICD-10-CM

## 2023-08-22 DIAGNOSIS — Z0001 Encounter for general adult medical examination with abnormal findings: Secondary | ICD-10-CM | POA: Diagnosis not present

## 2023-08-22 LAB — BASIC METABOLIC PANEL
BUN: 20 mg/dL (ref 6–23)
CO2: 26 meq/L (ref 19–32)
Calcium: 10.3 mg/dL (ref 8.4–10.5)
Chloride: 104 meq/L (ref 96–112)
Creatinine, Ser: 1.11 mg/dL (ref 0.40–1.20)
GFR: 60.47 mL/min (ref >60.00)
Glucose, Bld: 74 mg/dL (ref 70–99)
Potassium: 3.8 meq/L (ref 3.5–5.1)
Sodium: 140 meq/L (ref 135–145)

## 2023-08-22 LAB — MAGNESIUM: Magnesium: 2.3 mg/dL (ref 1.5–2.5)

## 2023-08-23 ENCOUNTER — Ambulatory Visit: Payer: 59 | Admitting: Neurology

## 2023-08-23 LAB — HEPATITIS C ANTIBODY: Hepatitis C Ab: NONREACTIVE

## 2023-08-23 LAB — HIV ANTIBODY (ROUTINE TESTING W REFLEX): HIV 1&2 Ab, 4th Generation: NONREACTIVE

## 2023-08-24 ENCOUNTER — Other Ambulatory Visit (HOSPITAL_COMMUNITY): Payer: Self-pay

## 2023-08-27 ENCOUNTER — Encounter (HOSPITAL_COMMUNITY): Payer: Self-pay

## 2023-08-27 ENCOUNTER — Other Ambulatory Visit (HOSPITAL_COMMUNITY): Payer: Self-pay

## 2023-08-31 ENCOUNTER — Other Ambulatory Visit (HOSPITAL_COMMUNITY): Payer: Self-pay

## 2023-09-04 ENCOUNTER — Other Ambulatory Visit: Payer: Self-pay

## 2023-09-07 ENCOUNTER — Ambulatory Visit (INDEPENDENT_AMBULATORY_CARE_PROVIDER_SITE_OTHER): Payer: 59 | Admitting: Neurology

## 2023-09-07 DIAGNOSIS — G43709 Chronic migraine without aura, not intractable, without status migrainosus: Secondary | ICD-10-CM

## 2023-09-07 DIAGNOSIS — Z9189 Other specified personal risk factors, not elsewhere classified: Secondary | ICD-10-CM | POA: Diagnosis not present

## 2023-09-07 MED ORDER — ONABOTULINUMTOXINA 100 UNITS IJ SOLR
200.0000 [IU] | Freq: Once | INTRAMUSCULAR | Status: AC
Start: 2023-09-07 — End: 2023-09-07
  Administered 2023-09-07: 155 [IU] via INTRAMUSCULAR

## 2023-09-07 NOTE — Progress Notes (Signed)

## 2023-09-11 ENCOUNTER — Other Ambulatory Visit: Payer: Self-pay | Admitting: Obstetrics and Gynecology

## 2023-09-11 DIAGNOSIS — Z9189 Other specified personal risk factors, not elsewhere classified: Secondary | ICD-10-CM

## 2023-09-20 ENCOUNTER — Other Ambulatory Visit: Payer: Self-pay | Admitting: Internal Medicine

## 2023-09-20 ENCOUNTER — Other Ambulatory Visit (HOSPITAL_COMMUNITY): Payer: Self-pay

## 2023-09-20 MED ORDER — NEBIVOLOL HCL 10 MG PO TABS
10.0000 mg | ORAL_TABLET | Freq: Every day | ORAL | 1 refills | Status: DC
  Filled 2023-09-20: qty 90, 90d supply, fill #0
  Filled 2023-09-24: qty 30, 30d supply, fill #0
  Filled 2023-11-28 (×2): qty 30, 30d supply, fill #1
  Filled 2024-01-16: qty 30, 30d supply, fill #2
  Filled 2024-02-12 – 2024-02-27 (×2): qty 30, 30d supply, fill #3
  Filled 2024-04-08: qty 30, 30d supply, fill #4

## 2023-09-24 ENCOUNTER — Other Ambulatory Visit (HOSPITAL_COMMUNITY): Payer: Self-pay

## 2023-10-09 ENCOUNTER — Telehealth: Payer: Self-pay | Admitting: Internal Medicine

## 2023-10-09 NOTE — Telephone Encounter (Signed)
Patient wants to know if her fmla papers can be updated to reflect her diagnois with migraines.    Please call patient and advise - [phone]

## 2023-10-09 NOTE — Telephone Encounter (Signed)
Please advise 

## 2023-10-10 NOTE — Telephone Encounter (Signed)
Unable to reach patient. Also unable to leave VM. Will try to reach patient again later.

## 2023-10-12 NOTE — Telephone Encounter (Signed)
Unable to reach patient. LMTRC  

## 2023-10-17 NOTE — Telephone Encounter (Signed)
Patient will come to pick up today

## 2023-10-17 NOTE — Telephone Encounter (Signed)
Misunderstanding on communication part. The patient hasn't brought the FMLA paperwork to [place] to fill out yet. She cam to the office and stated that she will get the paperwork and bring it to [place].

## 2023-10-26 ENCOUNTER — Telehealth: Payer: Self-pay | Admitting: Neurology

## 2023-10-26 DIAGNOSIS — Z0279 Encounter for issue of other medical certificate: Secondary | ICD-10-CM

## 2023-10-26 NOTE — Telephone Encounter (Signed)
Received FMLA paperwork via fax. Pt would like forms to be faxed in once completed. $25 form fee has been paid.

## 2023-10-27 ENCOUNTER — Ambulatory Visit
Admission: RE | Admit: 2023-10-27 | Discharge: 2023-10-27 | Disposition: A | Discharge status: Home / Self Care | Payer: 59 | Source: Ambulatory Visit | Attending: Obstetrics and Gynecology | Admitting: Obstetrics and Gynecology

## 2023-10-27 DIAGNOSIS — Z9189 Other specified personal risk factors, not elsewhere classified: Secondary | ICD-10-CM

## 2023-10-27 DIAGNOSIS — Z1239 Encounter for other screening for malignant neoplasm of breast: Secondary | ICD-10-CM | POA: Diagnosis not present

## 2023-10-27 DIAGNOSIS — Z803 Family history of malignant neoplasm of breast: Secondary | ICD-10-CM | POA: Diagnosis not present

## 2023-10-27 MED ORDER — GADOPICLENOL 0.5 MMOL/ML IV SOLN
9.0000 mL | Freq: Once | INTRAVENOUS | Status: AC | PRN
  Administered 2023-10-27: 9 mL via INTRAVENOUS

## 2023-11-02 ENCOUNTER — Other Ambulatory Visit: Payer: Self-pay | Admitting: Neurology

## 2023-11-02 ENCOUNTER — Telehealth: Payer: Self-pay

## 2023-11-02 MED ORDER — ELETRIPTAN HYDROBROMIDE 40 MG PO TABS
40.0000 mg | ORAL_TABLET | ORAL | 5 refills | Status: DC | PRN
  Filled 2023-11-02: qty 10, 30d supply, fill #0

## 2023-11-02 NOTE — Telephone Encounter (Signed)
Per patient her migraines are 2-3 a week now. Patient doesn't have a rescue medication.    Gabapentin she not taking right now. Please help

## 2023-11-02 NOTE — Telephone Encounter (Signed)
PA expired 10/23/23 for Botox.    New PA needed

## 2023-11-02 NOTE — Telephone Encounter (Signed)
LMOVM for patient, Per Dr.Jaffe, I sent a prescription for eletriptan to [facility] to take for rescue  medication.

## 2023-11-02 NOTE — Telephone Encounter (Signed)
Pt called in returning a call from [facility]

## 2023-11-03 ENCOUNTER — Other Ambulatory Visit (HOSPITAL_COMMUNITY): Payer: Self-pay

## 2023-11-05 ENCOUNTER — Telehealth: Payer: Self-pay | Admitting: Neurology

## 2023-11-05 ENCOUNTER — Telehealth: Payer: Self-pay | Admitting: Pharmacy Technician

## 2023-11-05 ENCOUNTER — Other Ambulatory Visit (HOSPITAL_COMMUNITY): Payer: Self-pay

## 2023-11-05 NOTE — Telephone Encounter (Signed)
Pt called in and left a message with the access nurse on 11/02/23. She is returning a call.

## 2023-11-05 NOTE — Telephone Encounter (Signed)
Pharmacy Patient Advocate Encounter   Received notification from Pt Calls Messages that prior authorization for Eletriptan is required/requested.   Insurance verification completed.   The patient is insured through [facility] .   Per test claim: Sumatriptan or Rizatriptan in the last 180 days. Sumatriptan or Rizatriptan is preferred by the insurance.  If suggested medication is appropriate, Please send in a new RX and discontinue this one. If not, please advise as to why it's not appropriate so that we may request a Prior Authorization.  I see that it's noted that neither of these are effective for the pt. I started a PA..... [id number], but it returned "This drug/product is not covered under the pharmacy benefit. Prior Authorization is not available."

## 2023-11-05 NOTE — Telephone Encounter (Signed)
Patient advised of Medication sent.

## 2023-11-05 NOTE — Telephone Encounter (Signed)
Patient advised of medication sent. Working PA.

## 2023-11-05 NOTE — Telephone Encounter (Signed)
New Encounter created for follow up. For additional info see Pharmacy Prior Auth telephone encounter from 11/05/23- for Eletriptan.  Botox will still need to be done.

## 2023-11-12 ENCOUNTER — Other Ambulatory Visit (HOSPITAL_COMMUNITY): Payer: Self-pay

## 2023-11-12 NOTE — Telephone Encounter (Signed)
Pharmacy Patient Advocate Encounter Called [employer] and submited a clinical PA over the phone. Faxed chart notes to [phone] PA required; PA submitted to above mentioned insurance via Phone Key/confirmation #/[id number] Status is pending

## 2023-11-12 NOTE — Telephone Encounter (Addendum)
Unfortunately it doesn't appear this is will cover the medication or allow a Prior Auth for it. I'll call and see if I can get any additional information.

## 2023-11-19 NOTE — Telephone Encounter (Signed)
Pharmacy Patient Advocate Encounter  Received notification from [facility] that Prior Authorization for Eletriptan 40mg  has been DENIED.  Full denial letter will be uploaded to the media tab. See denial reason below.   PA #/Case ID/Reference #: [id number]

## 2023-11-21 ENCOUNTER — Other Ambulatory Visit: Payer: Self-pay

## 2023-11-22 ENCOUNTER — Other Ambulatory Visit: Payer: Self-pay

## 2023-11-22 NOTE — Progress Notes (Signed)
Specialty Pharmacy Refill Coordination Note  Rita Harrison is a 42 y.o. female contacted today regarding refills of specialty medication(s) Onabotulinumtoxina   Patient requested Courier to Provider Office   Delivery date: 12/03/23   Verified address: [facility] [address] [place] [address]   Medication will be filled on 11/30/23.

## 2023-11-22 NOTE — Progress Notes (Signed)
NEUROLOGY FOLLOW UP OFFICE NOTE  Rita WOJCIECHOWSKI [id number]  Assessment/Plan:   1  Migraine without aura, without status migrainosus, not intractable  2  Cervicalgia 3  Hypertension   Migraine prevention:  Botox Migraine rescue:  Will try naratriptan 2.5mg .  Will also provide samples of Zavzpret to try Limit use of pain relievers to no more than 2 days out of week to prevent risk of rebound or medication-overuse headache. Keep headache diary Follow up with PCP regarding BP  Follow up 6 months.  Subjective:  Rita Harrison is a 42 year old right-handed female with HTN, sickle cell trait and history of right upper extremity DVT who follows up for migraines.   UPDATE: Insurance would not cover eletriptan.  Started Botox, status post 2 rounds.  Went to PT for her neck.  Also increased gabapentin.  Noticed significant improvement in headaches.  Has not been back to the ED.  Due to headache improvement, she requested to stop gabapentin.  She was tapered off slowly from 900mg  at bedtime.  She was averaging 4-5 a month.  More recently, she has had stress with school and caring for your ill mother, which has caused an uptick of migraines (8 to 9 in November) but still much better than daily.   Current NSAIDS/analgesics:  Tylenol, Voltaren gel, ibuprofen Current triptans:  none Current ergotamine:  none Current anti-emetic:  ondansetron ODT 4mg  Current muscle relaxants:  none Current Antihypertensive medications:  nebivolol, triamterene-HCTZ Current Antidepressant medications:  none Current Anticonvulsant medications:  none Current anti-CGRP:  none Current Vitamins/Herbal/Supplements:  none Current Antihistamines/Decongestants:  none Other therapy:  Botox Birth control:  none Other medications:  Alprazolam (anxiety)     Caffeine:  no coffee, soda Diet:  Drinks a lot of water.  Stopped soda.  Increased fruits and vegetables intake.   Exercise:  goes to gym most  days Depression:  some; Anxiety:  yes.  Her son's father and her grandmother passed away last year.  Her friend passed away 2 years ago.   Other pain:  no Sleep hygiene:  good until she started having headaches.  Headaches may wake her up.  Also has hot flashes at night   HISTORY:  Onset:  August 02, 2022 Location:  right frontal, radiating from back of neck Quality:  pressure Intensity:  mild.  Gradual onset Aura:  absent Prodrome:  absent Associated symptoms:  Nausea, photophobia, phonophobia.  She denies associated vomiting, visual disturbance, autonomic symptoms or unilateral numbness or weakness. Duration:  Treats it immediately with ibuprofen or Tylenol Extra-strength and will last 15-30 minutes Frequency:  3 times daily (8:30 AM, around noon, 7 PM) daily.  Sometimes may wake her up in middle of night for which she takes another Tylenol or ibuprofen Frequency of abortive medication: 3 to 4 times daily Triggers:  unknown Relieving factors:  Tylenol and ibuprofen Activity:  not aggravating Reports similar headaches sometimes associated with her cycle, but infrequent. She saw her gynecologist.  Blood work showed she is almost perimenopausal.  Reports hot flashes at night.  Blood pressure has been elevated but unsure if cause or effect of headache.   MRI of brain without contrast on 10/23/2022 revealed minimal scattered T2 FLAIR hyperintense foci within the cerebral white matter as well as prominent posterior pituitary bright spot vs pars intermedia cyst but nothing concerning that would explain headaches.    She continued having bilateral neck pain but also numbness in the hands. She saw [facility]  as well as [facility].  MRI of cervical spine on 01/12/2023 personally reviewed revealed only small disc protrusions at C4-5 and C5-6 contacting the ventral cord but no stenosis or impingement.  She received bilateral occipital nerve block on 3/25 which was  ineffective.  Started on gabapentin and Volteran gel.  Gabapentin has been ineffective.  In physical therapy, undergoing dry needling.  No significant improvement thus far.     Past NSAIDS/analgesics:  ketorolac, meloxicam, BC powder Past abortive triptans:  rizatriptan, sumatriptan tab Past abortive ergotamine:  none Past muscle relaxants:  methocarbamol, cylcobenzaprine Past anti-emetic:  ondansetron Past antihypertensive medications:  lisinopril Past antidepressant medications:  none Past anticonvulsant medications:  topiramate (paresthesias, fatigue), gabapentin 900mg  at bedtime (occipital neuralgia/cervicalgia) Past anti-CGRP:  Nurtec (made migraine worse), Ubrelvy (made migraines worse) Past vitamins/Herbal/Supplements:  none Past antihistamines/decongestants:  diphenhydramine Other past therapies:  occipital nerve block, physical therapy cervicalgia         Family history of headache:  Sister (migraines).  No family history of cerebral aneurysms.   PAST MEDICAL HISTORY: Past Medical History:  Diagnosis Date   Anxiety    Arm DVT (deep venous thromboembolism), chronic (HCC)    RUE   Chicken pox    Encounter for general adult medical examination with abnormal findings 11/09/2020   Hypertension    Sickle cell anemia (HCC)    sickle cell trait    MEDICATIONS: Current Outpatient Medications on File Prior to Visit  Medication Sig Dispense Refill   ALPRAZolam (XANAX) 0.25 MG tablet Take 1 tablet (0.25 mg total) by mouth 2 (two) times daily as needed for anxiety. 20 tablet 1   Aluminum Chloride in Alcohol 6.25 % SOLN Apply 1 actuation topically at bedtime as directed 60 mL 3   amLODipine (NORVASC) 5 MG tablet Take 1 tablet (5 mg total) by mouth daily. 90 tablet 0   botulinum toxin Type A (BOTOX) 200 units injection Inject 200 Units into the muscle every 3 (three) months. Inject 155 units IM into multiple site in the face,neck and head once every 90 days 1 each 4   clindamycin  (CLEOCIN T) 1 % lotion APPLY A THIN LAYER TO THE AFFECTED AREA(S) 2 TIMES PER DAY AS NEEDED 60 mL 1   cyclobenzaprine (FLEXERIL) 10 MG tablet Take 1 tablet (10 mg total) by mouth 3 (three) times daily as needed for muscle spasms. 90 tablet 5   eletriptan (RELPAX) 40 MG tablet Take 1 tablet (40 mg total) by mouth as needed for migraine or headache. May repeat in 2 hours if headache persists or recurs.  Maximum 2 tablets in 24 hours. 10 tablet 5   Fluocinolone Acetonide Body (DERMA-SMOOTHE/FS BODY) 0.01 % OIL Apply topically at bedtime as directed. 118 mL 1   gabapentin (NEURONTIN) 300 MG capsule Take 3 capsules (900 mg total) by mouth at bedtime. 90 capsule 5   hydrocortisone 2.5 % cream Apply 1 Application topically 2 (two) times daily 1-2 weeks on/off as needed for flare 30 g 2   modafinil (PROVIGIL) 100 MG tablet Take 1 tablet (100 mg total) by mouth daily. 90 tablet 0   nebivolol (BYSTOLIC) 10 MG tablet Take 1 tablet (10 mg total) by mouth daily. 90 tablet 1   omeprazole (PRILOSEC) 20 MG capsule Take 1 capsule (20 mg total) by mouth daily. 30 capsule 1   potassium chloride (KLOR-CON M) 10 MEQ tablet Take 1 tablet (10 mEq total) by mouth 2 (two) times daily. 180 tablet  1   prochlorperazine (COMPAZINE) 5 MG tablet Take 1 tablet (5 mg) by mouth every 6 hours as needed for nausea or vomiting. 30 tablet 0   terbinafine (LAMISIL) 250 MG tablet Take 1 tablet (250 mg total) by mouth daily. 90 tablet 0   triamterene-hydrochlorothiazide (DYAZIDE) 37.5-25 MG capsule Take 1 capsule by mouth daily. 90 capsule 0   No current facility-administered medications on file prior to visit.    ALLERGIES: Allergies  Allergen Reactions   Lisinopril Shortness Of Breath and Itching    angioedema   Indapamide Rash   Spironolactone Other (See Comments)    itching    FAMILY HISTORY: Family History  Problem Relation Age of Onset   Sickle cell anemia Father    Kidney cancer Father    Migraines Sister    Breast  cancer Maternal Grandmother       Objective:  Blood pressure (!) 146/90, pulse 83, SpO2 91%. General: No acute distress.  Patient appears well-groomed.      Shon Millet, DO  CC: Sanda Linger, MD

## 2023-11-23 ENCOUNTER — Ambulatory Visit (INDEPENDENT_AMBULATORY_CARE_PROVIDER_SITE_OTHER): Payer: 59 | Admitting: Neurology

## 2023-11-23 ENCOUNTER — Other Ambulatory Visit (HOSPITAL_COMMUNITY): Payer: Self-pay

## 2023-11-23 ENCOUNTER — Encounter: Payer: Self-pay | Admitting: Neurology

## 2023-11-23 VITALS — BP 146/90 | HR 83

## 2023-11-23 DIAGNOSIS — I10 Essential (primary) hypertension: Secondary | ICD-10-CM | POA: Diagnosis not present

## 2023-11-23 DIAGNOSIS — G43009 Migraine without aura, not intractable, without status migrainosus: Secondary | ICD-10-CM

## 2023-11-23 DIAGNOSIS — M542 Cervicalgia: Secondary | ICD-10-CM | POA: Diagnosis not present

## 2023-11-23 MED ORDER — NARATRIPTAN HCL 2.5 MG PO TABS
2.5000 mg | ORAL_TABLET | ORAL | 5 refills | Status: AC | PRN
  Filled 2023-11-23: qty 9, 15d supply, fill #0
  Filled 2024-10-06: qty 9, 15d supply, fill #1

## 2023-11-23 NOTE — Progress Notes (Signed)
Medication Samples have been provided to the patient.  Drug name:zavzpret    Strength: 10mg         Qty: 2  LOT: [id number]      Exp.Date: 12/2024  Dosing instructions: use as directed   The patient has been instructed regarding the correct time, dose, and frequency of taking this medication, including desired effects and most common side effects.

## 2023-11-23 NOTE — Patient Instructions (Signed)
Continue Botox For acute treatment, sent in prescription for naratriptan to take as needed/directed.  I will also provide you samples of Zavzpret, a nasal spray - 1 spray in one nostril once daily as needed.  Let me know if it is effective Limit use of pain relievers to no more than 2 days out of week to prevent risk of rebound or medication-overuse headache. Keep headache diary Follow up 6 months.

## 2023-11-28 ENCOUNTER — Other Ambulatory Visit: Payer: Self-pay | Admitting: Internal Medicine

## 2023-11-28 ENCOUNTER — Other Ambulatory Visit (HOSPITAL_COMMUNITY): Payer: Self-pay

## 2023-11-28 DIAGNOSIS — L219 Seborrheic dermatitis, unspecified: Secondary | ICD-10-CM

## 2023-11-28 DIAGNOSIS — I10 Essential (primary) hypertension: Secondary | ICD-10-CM

## 2023-11-28 MED ORDER — TRIAMTERENE-HCTZ 37.5-25 MG PO CAPS
1.0000 | ORAL_CAPSULE | Freq: Every day | ORAL | 0 refills | Status: DC
  Filled 2023-11-28: qty 90, 90d supply, fill #0

## 2023-11-28 MED ORDER — FLUOCINOLONE ACETONIDE BODY 0.01 % EX OIL
1.0000 | TOPICAL_OIL | Freq: Every day | CUTANEOUS | 1 refills | Status: DC
  Filled 2023-11-28 – 2024-01-16 (×2): qty 118.28, 30d supply, fill #0

## 2023-11-28 MED ORDER — AMLODIPINE BESYLATE 5 MG PO TABS
5.0000 mg | ORAL_TABLET | Freq: Every day | ORAL | 0 refills | Status: DC
  Filled 2023-11-28: qty 90, 90d supply, fill #0

## 2023-11-30 ENCOUNTER — Other Ambulatory Visit (HOSPITAL_COMMUNITY): Payer: Self-pay

## 2023-11-30 ENCOUNTER — Telehealth: Payer: Self-pay | Admitting: Pharmacy Technician

## 2023-11-30 ENCOUNTER — Other Ambulatory Visit: Payer: Self-pay

## 2023-11-30 NOTE — Telephone Encounter (Signed)
Pharmacy Patient Advocate Encounter   Received notification from Patient Pharmacy that prior authorization for BOTOX 200 is required/requested.   Insurance verification completed.   The patient is insured through [facility] .   Per test claim: PA required; PA submitted to above mentioned insurance via CoverMyMeds Key/confirmation #/EOC [id number] Status is pending

## 2023-11-30 NOTE — Telephone Encounter (Signed)
PA has been submitted, and telephone encounter has been created. 

## 2023-12-03 ENCOUNTER — Other Ambulatory Visit (HOSPITAL_COMMUNITY): Payer: Self-pay

## 2023-12-04 ENCOUNTER — Other Ambulatory Visit (HOSPITAL_COMMUNITY): Payer: Self-pay

## 2023-12-04 ENCOUNTER — Other Ambulatory Visit: Payer: Self-pay

## 2023-12-05 ENCOUNTER — Other Ambulatory Visit (HOSPITAL_COMMUNITY): Payer: Self-pay

## 2023-12-05 ENCOUNTER — Other Ambulatory Visit: Payer: Self-pay

## 2023-12-05 NOTE — Telephone Encounter (Signed)
Pharmacy Patient Advocate Encounter  Received notification from [facility] that Prior Authorization for BOTOX 200 has been APPROVED from 12.13.24 to 12.12.25. Ran test claim, Copay is $0. This test claim was processed through [facility]- copay amounts may vary at other pharmacies due to pharmacy/plan contracts, or as the patient moves through the different stages of their insurance plan.   PA #/Case ID/Reference #: [id number]

## 2023-12-07 ENCOUNTER — Ambulatory Visit (INDEPENDENT_AMBULATORY_CARE_PROVIDER_SITE_OTHER): Payer: 59 | Admitting: Neurology

## 2023-12-07 ENCOUNTER — Ambulatory Visit: Payer: 59 | Admitting: Neurology

## 2023-12-07 ENCOUNTER — Other Ambulatory Visit (HOSPITAL_COMMUNITY): Payer: Self-pay

## 2023-12-07 DIAGNOSIS — G43709 Chronic migraine without aura, not intractable, without status migrainosus: Secondary | ICD-10-CM | POA: Diagnosis not present

## 2023-12-07 MED ORDER — ONABOTULINUMTOXINA 100 UNITS IJ SOLR
200.0000 [IU] | Freq: Once | INTRAMUSCULAR | Status: AC
  Administered 2023-12-07: 155 [IU] via INTRAMUSCULAR

## 2023-12-07 NOTE — Progress Notes (Signed)
[facility]   Procedure Note Botox  Attending: Dr. Madelaine Bhat  Preoperative Diagnosis(es): Chronic migraine  Consent obtained from: The patient Benefits discussed included, but were not limited to decreased muscle tightness, increased joint range of motion, and decreased pain.  Risk discussed included, but were not limited pain and discomfort, bleeding, bruising, excessive weakness, venous thrombosis, muscle atrophy and dysphagia.  Anticipated outcomes of the procedure as well as he risks and benefits of the alternatives to the procedure, and the roles and tasks of the personnel to be involved, were discussed with the patient, and the patient consents to the procedure and agrees to proceed. A copy of the patient medication guide was given to the patient which explains the blackbox warning.  Patients identity and treatment sites confirmed Yes.  .  Details of Procedure: Skin was cleaned with alcohol. Prior to injection, the needle plunger was aspirated to make sure the needle was not within a blood vessel.  There was no blood retrieved on aspiration.    Following is a summary of the muscles injected  And the amount of Botulinum toxin used:  Dilution 200 units of Botox was reconstituted with 4 ml of preservative free normal saline. Time of reconstitution: At the time of the office visit (<30 minutes prior to injection)   Injections  155 total units of Botox was injected with a 30 gauge needle.  Injection Sites: L occipitalis: 15 units- 3 sites  R occiptalis: 15 units- 3 sites  L upper trapezius: 15 units- 3 sites R upper trapezius: 15 units- 3 sits          L paraspinal: 10 units- 2 sites R paraspinal: 10 units- 2 sites  Face L frontalis(2 injection sites):10 units   R frontalis(2 injection sites):10 units         L corrugator: 5 units   R corrugator: 5 units           Procerus: 5 units   L temporalis: 20 units R temporalis: 20 units   Agent:  200 units of botulinum Type A  (Onobotulinum Toxin type A) was reconstituted with 4 ml of preservative free normal saline.  Time of reconstitution: At the time of the office visit (<30 minutes prior to injection)     Total injected (Units):  155  Total wasted (Units):  45  Patient tolerated procedure well without complications.   Reinjection is anticipated in 3 months.

## 2023-12-31 ENCOUNTER — Encounter: Payer: Self-pay | Admitting: Internal Medicine

## 2024-01-03 ENCOUNTER — Other Ambulatory Visit: Payer: Self-pay | Admitting: Internal Medicine

## 2024-01-03 NOTE — Telephone Encounter (Signed)
Copied from CRM [id number]. Topic: General - Other >> Jan 03, 2024 12:57 PM Fredrich Romans wrote: Reason for CRM: patient sent a mychart message on 1/13/205 regarding receiving a referral for a weight loss clinic. She hasn't received a response back regarding this. She would like a phone call as soon as possible as to if DR Yetta Barre will be able to send the referral over for her.

## 2024-01-11 ENCOUNTER — Other Ambulatory Visit: Payer: Self-pay | Admitting: Internal Medicine

## 2024-01-11 ENCOUNTER — Other Ambulatory Visit (HOSPITAL_COMMUNITY): Payer: Self-pay

## 2024-01-11 DIAGNOSIS — Z6833 Body mass index (BMI) 33.0-33.9, adult: Secondary | ICD-10-CM | POA: Insufficient documentation

## 2024-01-11 MED ORDER — PHENTERMINE HCL 37.5 MG PO TABS
37.5000 mg | ORAL_TABLET | Freq: Every day | ORAL | 0 refills | Status: DC
  Filled 2024-01-11 – 2024-01-25 (×3): qty 30, 30d supply, fill #0

## 2024-01-14 ENCOUNTER — Other Ambulatory Visit: Payer: Self-pay

## 2024-01-14 ENCOUNTER — Other Ambulatory Visit (HOSPITAL_BASED_OUTPATIENT_CLINIC_OR_DEPARTMENT_OTHER): Payer: Self-pay

## 2024-01-14 ENCOUNTER — Encounter (HOSPITAL_COMMUNITY): Payer: Self-pay | Admitting: *Deleted

## 2024-01-14 ENCOUNTER — Emergency Department (HOSPITAL_BASED_OUTPATIENT_CLINIC_OR_DEPARTMENT_OTHER)
Admission: EM | Admit: 2024-01-14 | Discharge: 2024-01-14 | Disposition: A | Discharge status: Home / Self Care | Payer: Commercial Managed Care - PPO | Attending: Emergency Medicine | Admitting: Emergency Medicine

## 2024-01-14 ENCOUNTER — Emergency Department (HOSPITAL_COMMUNITY)
Admission: EM | Admit: 2024-01-14 | Discharge: 2024-01-14 | Discharge status: Against Medical Advice | Payer: Commercial Managed Care - PPO | Attending: Emergency Medicine | Admitting: Emergency Medicine

## 2024-01-14 ENCOUNTER — Encounter (HOSPITAL_BASED_OUTPATIENT_CLINIC_OR_DEPARTMENT_OTHER): Payer: Self-pay | Admitting: Emergency Medicine

## 2024-01-14 DIAGNOSIS — I10 Essential (primary) hypertension: Secondary | ICD-10-CM | POA: Insufficient documentation

## 2024-01-14 DIAGNOSIS — Z20822 Contact with and (suspected) exposure to covid-19: Secondary | ICD-10-CM | POA: Insufficient documentation

## 2024-01-14 DIAGNOSIS — J029 Acute pharyngitis, unspecified: Secondary | ICD-10-CM | POA: Insufficient documentation

## 2024-01-14 DIAGNOSIS — Z5321 Procedure and treatment not carried out due to patient leaving prior to being seen by health care provider: Secondary | ICD-10-CM | POA: Insufficient documentation

## 2024-01-14 DIAGNOSIS — R030 Elevated blood-pressure reading, without diagnosis of hypertension: Secondary | ICD-10-CM

## 2024-01-14 LAB — RESP PANEL BY RT-PCR (RSV, FLU A&B, COVID)  RVPGX2
Influenza A by PCR: NEGATIVE
Influenza B by PCR: NEGATIVE
Resp Syncytial Virus by PCR: NEGATIVE
SARS Coronavirus 2 by RT PCR: NEGATIVE

## 2024-01-14 LAB — GROUP A STREP BY PCR: Group A Strep by PCR: NOT DETECTED

## 2024-01-14 MED ORDER — AMOXICILLIN 500 MG PO CAPS
500.0000 mg | ORAL_CAPSULE | Freq: Three times a day (TID) | ORAL | 0 refills | Status: DC
  Filled 2024-01-14: qty 21, 7d supply, fill #0

## 2024-01-14 MED ORDER — IBUPROFEN 400 MG PO TABS
400.0000 mg | ORAL_TABLET | Freq: Once | ORAL | Status: AC
  Administered 2024-01-14: 400 mg via ORAL
  Filled 2024-01-14: qty 1

## 2024-01-14 NOTE — Discharge Instructions (Addendum)
It was our pleasure to provide your ER care today - we hope that you feel better.  Drink plenty of fluids/stay well hydrated. Take amoxicillin as prescribed. You may take acetaminophen or ibuprofen as need for pain. You may also try throat lozenges as need for symptom relief.   Follow up with primary care doctor in one week if symptoms fail to improve/resolve. Also, your blood pressure is high today, continue your meds, and follow up with primary care doctor in 1-2 weeks.   Return to [facility] ER if worse, new symptoms, high fevers, severe pain, trouble breathing, or swallowing, severe one-sided throat pain and swelling, or other concern.

## 2024-01-14 NOTE — ED Triage Notes (Signed)
Patient  states she left work went home shower and then went to bed woke up with sorethroat and feeling like her throat was closing up. States she started a new medication on Tues for weight loss. Patient is able to swallow and speech is clear,

## 2024-01-14 NOTE — ED Notes (Signed)
Pt left due to wait

## 2024-01-14 NOTE — ED Triage Notes (Signed)
Pt to ED from home c/o SOB and sore throat that started suddenly tonight around 0100.  Started new medication for weight loss on Wednesday and is worried she's having an allergic reaction.  Pt able to speak in clear full sentences and maintaining secretions, tongue and lips non swollen.

## 2024-01-14 NOTE — ED Provider Notes (Signed)
[place] EMERGENCY DEPARTMENT AT [facility] Provider Note   CSN: [id number] Arrival date & time: 01/14/24  4098     History  Chief Complaint  Patient presents with   Sore Throat    Rita Harrison is a 43 y.o. female.  Pt c/o sore throat onset early this AM. No  trouble breathing or swallowing. No specific known ill contacts. No cough. No sinus pain or drainage. Indicates no dysphagia, trouble swallowing, or feeling that swallowed food either scratched throat or got stuck. No dental/oral pain.  Mild body aches. No fever or chills.   The history is provided by the patient and medical records.  Shortness of Breath Associated symptoms: sore throat   Associated symptoms: no abdominal pain, no chest pain, no cough, no fever, no headaches, no neck pain, no rash and no vomiting   Sore Throat Associated symptoms include shortness of breath. Pertinent negatives include no chest pain, no abdominal pain and no headaches.       Home Medications Prior to Admission medications   Medication Sig Start Date End Date Taking? Authorizing Provider  amoxicillin (AMOXIL) 500 MG capsule Take 1 capsule (500 mg total) by mouth 3 (three) times daily. 01/14/24  Yes Cathren Laine, MD  ALPRAZolam Prudy Feeler) 0.25 MG tablet Take 1 tablet (0.25 mg total) by mouth 2 (two) times daily as needed for anxiety. 08/16/23   Plotnikov, Georgina Quint, MD  Aluminum Chloride in Alcohol 6.25 % SOLN Apply 1 actuation topically at bedtime as directed 03/06/22   Etta Grandchild, MD  amLODipine (NORVASC) 5 MG tablet Take 1 tablet (5 mg total) by mouth daily. 11/28/23   Etta Grandchild, MD  botulinum toxin Type A (BOTOX) 200 units injection Inject 200 Units into the muscle every 3 (three) months. Inject 155 units IM into multiple site in the face,neck and head once every 90 days 05/08/23   Drema Dallas, DO  clindamycin (CLEOCIN T) 1 % lotion APPLY A THIN LAYER TO THE AFFECTED AREA(S) 2 TIMES PER DAY AS NEEDED 07/14/22      cyclobenzaprine (FLEXERIL) 10 MG tablet Take 1 tablet (10 mg total) by mouth 3 (three) times daily as needed for muscle spasms. 11/02/22   Drema Dallas, DO  Fluocinolone Acetonide Body (DERMA-SMOOTHE/FS BODY) 0.01 % OIL Apply topically at bedtime as directed. 11/28/23   Etta Grandchild, MD  gabapentin (NEURONTIN) 300 MG capsule Take 3 capsules (900 mg total) by mouth at bedtime. Patient not taking: Reported on 11/23/2023 04/30/23   Drema Dallas, DO  hydrocortisone 2.5 % cream Apply 1 Application topically 2 (two) times daily 1-2 weeks on/off as needed for flare 07/31/23     modafinil (PROVIGIL) 100 MG tablet Take 1 tablet (100 mg total) by mouth daily. 05/18/23   Etta Grandchild, MD  naratriptan (AMERGE) 2.5 MG tablet Take 1 tablet (2.5 mg total) by mouth as needed for migraine. Take one (1) tablet at onset of headache; if returns or does not resolve, may repeat after 4 hours; do not exceed 2 tablets in 24 hours. 11/23/23   Drema Dallas, DO  nebivolol (BYSTOLIC) 10 MG tablet Take 1 tablet (10 mg total) by mouth daily. 09/20/23   Etta Grandchild, MD  omeprazole (PRILOSEC) 20 MG capsule Take 1 capsule (20 mg total) by mouth daily. 08/13/23   Etta Grandchild, MD  phentermine (ADIPEX-P) 37.5 MG tablet Take 1 tablet (37.5 mg total) by mouth daily before breakfast. 01/11/24   Yetta Barre,  Bernadene Bell, MD  potassium chloride (KLOR-CON M) 10 MEQ tablet Take 1 tablet (10 mEq total) by mouth 2 (two) times daily. 11/29/22   Etta Grandchild, MD  prochlorperazine (COMPAZINE) 5 MG tablet Take 1 tablet (5 mg) by mouth every 6 hours as needed for nausea or vomiting. 06/05/23   Etta Grandchild, MD  terbinafine (LAMISIL) 250 MG tablet Take 1 tablet (250 mg total) by mouth daily. 07/24/23   Vivi Barrack, DPM  triamterene-hydrochlorothiazide (DYAZIDE) 37.5-25 MG capsule Take 1 capsule by mouth daily. 11/28/23   Etta Grandchild, MD      Allergies    Lisinopril, Indapamide, and Spironolactone    Review of Systems   Review of  Systems  Constitutional:  Negative for chills and fever.  HENT:  Positive for sore throat. Negative for dental problem, rhinorrhea, sinus pain and trouble swallowing.   Eyes:  Negative for redness.  Respiratory:  Positive for shortness of breath. Negative for cough.   Cardiovascular:  Negative for chest pain and leg swelling.  Gastrointestinal:  Negative for abdominal pain, diarrhea and vomiting.  Genitourinary:  Negative for dysuria and flank pain.  Musculoskeletal:  Negative for neck pain and neck stiffness.  Skin:  Negative for rash.  Neurological:  Negative for headaches.    Physical Exam Updated Vital Signs BP (!) 144/96 (BP Location: Right Arm)   Pulse 89   Temp 98 F (36.7 C) (Oral)   Resp 16   Ht 1.727 m (5\' 8" )   Wt 90.7 kg   LMP 01/07/2024 (Approximate)   SpO2 100%   BMI 30.41 kg/m  Physical Exam Vitals and nursing note reviewed.  Constitutional:      Appearance: Normal appearance. She is well-developed.  HENT:     Head: Atraumatic.     Right Ear: Tympanic membrane normal.     Left Ear: Tympanic membrane normal.     Nose: Nose normal.     Mouth/Throat:     Mouth: Mucous membranes are moist.     Comments: Mild erythema. No asymmetric swelling or abscess noted. No angioedema noted. No trismus, no dental/gum swelling or abscess noted.  Eyes:     General: No scleral icterus.    Conjunctiva/sclera: Conjunctivae normal.  Neck:     Trachea: No tracheal deviation.     Comments: Trachea midline. Thyroid not grossly enlarged or tender. No mass.  Cardiovascular:     Rate and Rhythm: Normal rate and regular rhythm.     Pulses: Normal pulses.     Heart sounds: Normal heart sounds. No murmur heard.    No friction rub. No gallop.  Pulmonary:     Effort: Pulmonary effort is normal. No respiratory distress.     Breath sounds: Normal breath sounds.  Abdominal:     General: Bowel sounds are normal. There is no distension.     Palpations: Abdomen is soft.     Tenderness:  There is no abdominal tenderness. There is no guarding.     Comments: Soft non tender. No hsm.   Genitourinary:    Comments: No cva tenderness.  Musculoskeletal:        General: No swelling or tenderness.     Cervical back: Normal range of motion and neck supple. No rigidity. No muscular tenderness.     Right lower leg: No edema.     Left lower leg: No edema.  Lymphadenopathy:     Cervical: No cervical adenopathy.  Skin:    General: Skin is  warm and dry.     Findings: No rash.  Neurological:     Mental Status: She is alert.     Comments: Alert, speech normal.   Psychiatric:        Mood and Affect: Mood normal.     ED Results / Procedures / Treatments   Labs (all labs ordered are listed, but only abnormal results are displayed) Results for orders placed or performed during the hospital encounter of 01/14/24  Group A Strep by PCR   Collection Time: 01/14/24  7:03 AM   Specimen: Throat; Sterile Swab  Result Value Ref Range   Group A Strep by PCR NOT DETECTED NOT DETECTED  Resp panel by RT-PCR (RSV, Flu A&B, Covid) Anterior Nasal Swab   Collection Time: 01/14/24  8:34 AM   Specimen: Anterior Nasal Swab  Result Value Ref Range   SARS Coronavirus 2 by RT PCR NEGATIVE NEGATIVE   Influenza A by PCR NEGATIVE NEGATIVE   Influenza B by PCR NEGATIVE NEGATIVE   Resp Syncytial Virus by PCR NEGATIVE NEGATIVE      EKG None  Radiology No results found.  Procedures Procedures    Medications Ordered in ED Medications  ibuprofen (ADVIL) tablet 400 mg (400 mg Oral Given 01/14/24 0830)    ED Course/ Medical Decision Making/ A&P                                 Medical Decision Making Problems Addressed: Elevated blood pressure reading: acute illness or injury Essential hypertension: chronic illness or injury Pharyngitis, unspecified etiology: acute illness or injury  Amount and/or Complexity of Data Reviewed External Data Reviewed: notes. Labs: ordered. Decision-making  details documented in ED Course.  Risk Prescription drug management.   Labs sent.   Reviewed nursing notes and prior charts for additional history.   No meds pta.   Ibuprofen po. Po fluids.   Labs reviewed/interpreted by me - strep neg. Flu negative.   Pt breathing and swallowing comfortably.   Pt appears stable for d/c.  Rec pcp f/u.  Return precautions provided.           Final Clinical Impression(s) / ED Diagnoses Final diagnoses:  Pharyngitis, unspecified etiology  Elevated blood pressure reading  Essential hypertension    Rx / DC Orders ED Discharge Orders          Ordered    amoxicillin (AMOXIL) 500 MG capsule  3 times daily        01/14/24 0942              Cathren Laine, MD 01/14/24 [phone]

## 2024-01-16 ENCOUNTER — Other Ambulatory Visit (HOSPITAL_COMMUNITY): Payer: Self-pay

## 2024-01-23 ENCOUNTER — Other Ambulatory Visit (HOSPITAL_COMMUNITY): Payer: Self-pay

## 2024-01-23 ENCOUNTER — Ambulatory Visit: Payer: 59 | Admitting: Neurology

## 2024-01-24 ENCOUNTER — Other Ambulatory Visit (HOSPITAL_COMMUNITY): Payer: Self-pay

## 2024-01-25 ENCOUNTER — Other Ambulatory Visit (HOSPITAL_COMMUNITY): Payer: Self-pay

## 2024-01-25 NOTE — Telephone Encounter (Signed)
 Received a message that patient received a bill regarding the procedure code not being cover by insurance ([employer]). [employer] and provided procedure code 35384 and Dx: G43.709. Representative said that pre certification is not required for either.   Call reference: [phone] PM

## 2024-02-12 ENCOUNTER — Other Ambulatory Visit (HOSPITAL_COMMUNITY): Payer: Self-pay

## 2024-02-15 ENCOUNTER — Ambulatory Visit: Payer: 59 | Admitting: Neurology

## 2024-02-18 ENCOUNTER — Other Ambulatory Visit: Payer: Self-pay

## 2024-02-18 ENCOUNTER — Other Ambulatory Visit (HOSPITAL_COMMUNITY): Payer: Self-pay

## 2024-02-18 NOTE — Progress Notes (Signed)
 Specialty Pharmacy Refill Coordination Note  Rita Harrison is a 43 y.o. female contacted today regarding refills of specialty medication(s) No data recorded  Patient requested (Patient-Rptd) Delivery   Delivery date: (Patient-Rptd) 02/29/24   Verified address: (Patient-Rptd) [address]   Medication will be filled on 02/27/24. New delivery date is 02/28/24.

## 2024-02-25 ENCOUNTER — Other Ambulatory Visit (HOSPITAL_COMMUNITY): Payer: Self-pay

## 2024-02-27 ENCOUNTER — Other Ambulatory Visit: Payer: Self-pay | Admitting: Internal Medicine

## 2024-02-27 ENCOUNTER — Encounter: Payer: Self-pay | Admitting: Internal Medicine

## 2024-02-27 ENCOUNTER — Other Ambulatory Visit: Payer: Self-pay

## 2024-02-27 ENCOUNTER — Other Ambulatory Visit (HOSPITAL_COMMUNITY): Payer: Self-pay

## 2024-02-27 DIAGNOSIS — F411 Generalized anxiety disorder: Secondary | ICD-10-CM

## 2024-02-27 DIAGNOSIS — I10 Essential (primary) hypertension: Secondary | ICD-10-CM

## 2024-02-27 DIAGNOSIS — G43411 Hemiplegic migraine, intractable, with status migrainosus: Secondary | ICD-10-CM

## 2024-02-28 ENCOUNTER — Other Ambulatory Visit (HOSPITAL_COMMUNITY): Payer: Self-pay

## 2024-02-28 ENCOUNTER — Other Ambulatory Visit: Payer: Self-pay

## 2024-02-28 MED ORDER — PROCHLORPERAZINE MALEATE 5 MG PO TABS
5.0000 mg | ORAL_TABLET | Freq: Four times a day (QID) | ORAL | 0 refills | Status: DC | PRN
  Filled 2024-02-28: qty 30, 8d supply, fill #0

## 2024-02-28 MED ORDER — AMLODIPINE BESYLATE 5 MG PO TABS
5.0000 mg | ORAL_TABLET | Freq: Every day | ORAL | 0 refills | Status: DC
  Filled 2024-02-28: qty 30, 30d supply, fill #0

## 2024-02-29 ENCOUNTER — Other Ambulatory Visit (HOSPITAL_COMMUNITY): Payer: Self-pay

## 2024-02-29 ENCOUNTER — Other Ambulatory Visit: Payer: Self-pay | Admitting: Internal Medicine

## 2024-02-29 DIAGNOSIS — F411 Generalized anxiety disorder: Secondary | ICD-10-CM | POA: Insufficient documentation

## 2024-02-29 MED ORDER — ALPRAZOLAM 0.25 MG PO TABS
0.2500 mg | ORAL_TABLET | Freq: Two times a day (BID) | ORAL | 2 refills | Status: AC | PRN
  Filled 2024-02-29: qty 20, 10d supply, fill #0

## 2024-03-07 ENCOUNTER — Ambulatory Visit (INDEPENDENT_AMBULATORY_CARE_PROVIDER_SITE_OTHER): Payer: Self-pay | Admitting: Neurology

## 2024-03-07 DIAGNOSIS — G43709 Chronic migraine without aura, not intractable, without status migrainosus: Secondary | ICD-10-CM

## 2024-03-07 MED ORDER — ONABOTULINUMTOXINA 100 UNITS IJ SOLR
200.0000 [IU] | Freq: Once | INTRAMUSCULAR | Status: AC
Start: 2024-03-07 — End: 2024-03-07
  Administered 2024-03-07: 155 [IU] via INTRAMUSCULAR

## 2024-03-07 NOTE — Progress Notes (Signed)

## 2024-03-17 ENCOUNTER — Encounter: Payer: Self-pay | Admitting: Neurology

## 2024-03-24 NOTE — Progress Notes (Unsigned)
   Established Patient Office Visit  Subjective   Patient ID: Rita Harrison, female    DOB: 1981-11-11  Age: 43 y.o. MRN: [id number]  No chief complaint on file.   HPI Patient presents today evaluation of feet swelling.  Reports compliance with medication regimen. Denies the need for refills. Not fasting today. Denies other concerns. Medical history as outlined below.  ROS Per HPI    Objective:     There were no vitals taken for this visit.  Physical Exam Vitals and nursing note reviewed.  Constitutional:      General: She is not in acute distress.    Appearance: Normal appearance. She is normal weight.  HENT:     Head: Normocephalic and atraumatic.     Right Ear: External ear normal.     Left Ear: External ear normal.     Nose: Nose normal.     Mouth/Throat:     Mouth: Mucous membranes are moist.     Pharynx: Oropharynx is clear.  Eyes:     Extraocular Movements: Extraocular movements intact.     Pupils: Pupils are equal, round, and reactive to light.  Cardiovascular:     Rate and Rhythm: Normal rate and regular rhythm.     Pulses: Normal pulses.     Heart sounds: Normal heart sounds.  Pulmonary:     Effort: Pulmonary effort is normal. No respiratory distress.     Breath sounds: Normal breath sounds. No wheezing, rhonchi or rales.  Musculoskeletal:        General: Normal range of motion.     Cervical back: Normal range of motion.     Right lower leg: No edema.     Left lower leg: No edema.  Lymphadenopathy:     Cervical: No cervical adenopathy.  Neurological:     General: No focal deficit present.     Mental Status: She is alert and oriented to person, place, and time.  Psychiatric:        Mood and Affect: Mood normal.        Thought Content: Thought content normal.    No results found for any visits on 03/25/24.   The ASCVD Risk score (Arnett DK, et al., 2019) failed to calculate for the following reasons:   Cannot find a previous HDL lab    Cannot find a previous total cholesterol lab    Assessment & Plan:   Primary hypertension  Stage 3a chronic kidney disease (HCC)  Edema, unspecified type     No follow-ups on file.    Sherald Barge, FNP

## 2024-03-25 ENCOUNTER — Ambulatory Visit (INDEPENDENT_AMBULATORY_CARE_PROVIDER_SITE_OTHER): Admitting: Family Medicine

## 2024-03-25 ENCOUNTER — Encounter: Payer: Self-pay | Admitting: Family Medicine

## 2024-03-25 ENCOUNTER — Other Ambulatory Visit (HOSPITAL_COMMUNITY): Payer: Self-pay

## 2024-03-25 VITALS — BP 130/82 | HR 65 | Temp 98.2°F | Ht 68.0 in | Wt 212.4 lb

## 2024-03-25 DIAGNOSIS — N1831 Chronic kidney disease, stage 3a: Secondary | ICD-10-CM | POA: Diagnosis not present

## 2024-03-25 DIAGNOSIS — Z6832 Body mass index (BMI) 32.0-32.9, adult: Secondary | ICD-10-CM

## 2024-03-25 DIAGNOSIS — R609 Edema, unspecified: Secondary | ICD-10-CM | POA: Diagnosis not present

## 2024-03-25 DIAGNOSIS — I10 Essential (primary) hypertension: Secondary | ICD-10-CM

## 2024-03-25 DIAGNOSIS — E66811 Obesity, class 1: Secondary | ICD-10-CM | POA: Diagnosis not present

## 2024-03-25 LAB — CBC WITH DIFFERENTIAL/PLATELET
Basophils Absolute: 0 10*3/uL (ref 0.0–0.1)
Basophils Relative: 0.5 % (ref 0.0–3.0)
Eosinophils Absolute: 0.1 10*3/uL (ref 0.0–0.7)
Eosinophils Relative: 1.6 % (ref 0.0–5.0)
HCT: 39.3 % (ref 36.0–46.0)
Hemoglobin: 13.3 g/dL (ref 12.0–15.0)
Lymphocytes Relative: 29.8 % (ref 12.0–46.0)
Lymphs Abs: 2.8 10*3/uL (ref 0.7–4.0)
MCHC: 33.9 g/dL (ref 30.0–36.0)
MCV: 81.6 fl (ref 78.0–100.0)
Monocytes Absolute: 0.3 10*3/uL (ref 0.1–1.0)
Monocytes Relative: 3.4 % (ref 3.0–12.0)
Neutro Abs: 6 10*3/uL (ref 1.4–7.7)
Neutrophils Relative %: 64.7 % (ref 43.0–77.0)
Platelets: 296 10*3/uL (ref 150.0–400.0)
RBC: 4.82 Mil/uL (ref 3.87–5.11)
RDW: 14.2 % (ref 11.5–15.5)
WBC: 9.3 10*3/uL (ref 4.0–10.5)

## 2024-03-25 LAB — COMPREHENSIVE METABOLIC PANEL WITH GFR
ALT: 42 U/L — ABNORMAL HIGH (ref 0–35)
AST: 25 U/L (ref 0–37)
Albumin: 4.4 g/dL (ref 3.5–5.2)
Alkaline Phosphatase: 65 U/L (ref 39–117)
BUN: 20 mg/dL (ref 6–23)
CO2: 25 meq/L (ref 19–32)
Calcium: 9.6 mg/dL (ref 8.4–10.5)
Chloride: 102 meq/L (ref 96–112)
Creatinine, Ser: 0.94 mg/dL (ref 0.40–1.20)
GFR: 73.52 mL/min (ref >60.00)
Glucose, Bld: 98 mg/dL (ref 70–99)
Potassium: 3.9 meq/L (ref 3.5–5.1)
Sodium: 137 meq/L (ref 135–145)
Total Bilirubin: 0.4 mg/dL (ref 0.2–1.2)
Total Protein: 7.4 g/dL (ref 6.0–8.3)

## 2024-03-25 LAB — BRAIN NATRIURETIC PEPTIDE: Pro B Natriuretic peptide (BNP): 12 pg/mL (ref 0.0–100.0)

## 2024-03-25 MED ORDER — FUROSEMIDE 20 MG PO TABS
20.0000 mg | ORAL_TABLET | Freq: Every day | ORAL | 1 refills | Status: DC | PRN
  Filled 2024-03-25: qty 30, 30d supply, fill #0

## 2024-03-25 NOTE — Assessment & Plan Note (Signed)
 Controlled, continue current medication regimen Will check kidney function, BMP, D-dimer today

## 2024-03-25 NOTE — Assessment & Plan Note (Signed)
 Will check electrolytes, kidney function, BNP, D-dimer given recent travel, obesity, hypertension history

## 2024-03-25 NOTE — Patient Instructions (Addendum)
 We are checking labs today, will be in contact with any results that require further attention  Continue with compression socks.   Continue to push fluids.   Lasix 20 mg to pharmacy, once daily as needed leg swelling.  Follow-up in about a month to recheck labs and swelling

## 2024-03-25 NOTE — Assessment & Plan Note (Signed)
 Continue efforts at healthy diet and activity level

## 2024-03-25 NOTE — Assessment & Plan Note (Signed)
 Stable, continue current medication regimen Will check kidney function today

## 2024-03-26 LAB — D-DIMER, QUANTITATIVE: D-Dimer, Quant: 0.52 ug{FEU}/mL — ABNORMAL HIGH (ref <0.50)

## 2024-03-28 ENCOUNTER — Other Ambulatory Visit: Payer: Self-pay | Admitting: Internal Medicine

## 2024-03-28 DIAGNOSIS — Z6833 Body mass index (BMI) 33.0-33.9, adult: Secondary | ICD-10-CM

## 2024-03-31 ENCOUNTER — Other Ambulatory Visit: Payer: Self-pay

## 2024-03-31 ENCOUNTER — Telehealth: Payer: Self-pay | Admitting: Internal Medicine

## 2024-03-31 DIAGNOSIS — R609 Edema, unspecified: Secondary | ICD-10-CM

## 2024-03-31 NOTE — Telephone Encounter (Signed)
 US  Ordered, LVM making patient aware.

## 2024-03-31 NOTE — Telephone Encounter (Signed)
 Copied from CRM [id number]. Topic: General - Other >> Mar 31, 2024  1:32 PM Dorisann Garre T wrote: Reason for CRM: order for ultrasound needs to be changed vascular veins lower bilater  code number for procedure [id number] and does it require any prior authorization cb [phone] for imaging center

## 2024-03-31 NOTE — Telephone Encounter (Signed)
 Spoke with patient, legs are still swollen but not as bad. Tender around ankles, and on top of feet. Started Furosemide on Wednesday or Thursday. Would like to move forward with the Ultrasound, prefers [facility] location for imaging.   Okay to order US  to rule out DVT?

## 2024-04-01 ENCOUNTER — Encounter: Payer: Self-pay | Admitting: Family Medicine

## 2024-04-01 ENCOUNTER — Ambulatory Visit (HOSPITAL_COMMUNITY)
Admission: RE | Admit: 2024-04-01 | Discharge: 2024-04-01 | Disposition: A | Discharge status: Home / Self Care | Source: Ambulatory Visit | Attending: Vascular Surgery | Admitting: Vascular Surgery

## 2024-04-01 DIAGNOSIS — R609 Edema, unspecified: Secondary | ICD-10-CM | POA: Diagnosis not present

## 2024-04-01 NOTE — Telephone Encounter (Signed)
 This order has been changed.

## 2024-04-02 NOTE — Telephone Encounter (Signed)
 Attempted to call patient back, no answer.

## 2024-04-02 NOTE — Telephone Encounter (Signed)
 Copied from CRM [id number]. Topic: General - Other >> Apr 02, 2024  9:40 AM Albertha Alosa wrote: Reason for CRM: Patient called in returning a missed call from [place], would like for her to give her a callback

## 2024-04-07 ENCOUNTER — Other Ambulatory Visit: Payer: Self-pay

## 2024-04-08 ENCOUNTER — Other Ambulatory Visit: Payer: Self-pay

## 2024-04-08 ENCOUNTER — Other Ambulatory Visit: Payer: Self-pay | Admitting: Internal Medicine

## 2024-04-08 ENCOUNTER — Encounter: Payer: Self-pay | Admitting: Internal Medicine

## 2024-04-08 DIAGNOSIS — Z6833 Body mass index (BMI) 33.0-33.9, adult: Secondary | ICD-10-CM

## 2024-04-17 ENCOUNTER — Encounter: Payer: Self-pay | Admitting: Internal Medicine

## 2024-04-17 ENCOUNTER — Ambulatory Visit: Admitting: Internal Medicine

## 2024-04-17 ENCOUNTER — Other Ambulatory Visit (HOSPITAL_COMMUNITY): Payer: Self-pay

## 2024-04-17 VITALS — BP 138/96 | HR 72 | Temp 98.3°F | Resp 16 | Ht 68.0 in | Wt 212.6 lb

## 2024-04-17 DIAGNOSIS — Z1211 Encounter for screening for malignant neoplasm of colon: Secondary | ICD-10-CM | POA: Insufficient documentation

## 2024-04-17 DIAGNOSIS — E1159 Type 2 diabetes mellitus with other circulatory complications: Secondary | ICD-10-CM

## 2024-04-17 DIAGNOSIS — E66811 Obesity, class 1: Secondary | ICD-10-CM | POA: Diagnosis not present

## 2024-04-17 DIAGNOSIS — I10 Essential (primary) hypertension: Secondary | ICD-10-CM | POA: Diagnosis not present

## 2024-04-17 DIAGNOSIS — Z6832 Body mass index (BMI) 32.0-32.9, adult: Secondary | ICD-10-CM | POA: Diagnosis not present

## 2024-04-17 DIAGNOSIS — E785 Hyperlipidemia, unspecified: Secondary | ICD-10-CM | POA: Diagnosis not present

## 2024-04-17 LAB — HCG, QUANTITATIVE, PREGNANCY: Quantitative HCG: <0.6 m[IU]/mL

## 2024-04-17 LAB — LIPID PANEL
Cholesterol: 176 mg/dL (ref 0–200)
HDL: 34.6 mg/dL — ABNORMAL LOW (ref >39.00)
LDL Cholesterol: 98 mg/dL (ref 0–99)
NonHDL: 141.32
Total CHOL/HDL Ratio: 5
Triglycerides: 216 mg/dL — ABNORMAL HIGH (ref 0.0–149.0)
VLDL: 43.2 mg/dL — ABNORMAL HIGH (ref 0.0–40.0)

## 2024-04-17 LAB — TSH: TSH: 0.79 u[IU]/mL (ref 0.35–5.50)

## 2024-04-17 MED ORDER — NEBIVOLOL HCL 10 MG PO TABS
10.0000 mg | ORAL_TABLET | Freq: Every day | ORAL | 1 refills | Status: AC
  Filled 2024-04-17 – 2024-05-25 (×2): qty 90, 90d supply, fill #0
  Filled 2024-08-27 – 2024-09-23 (×2): qty 90, 90d supply, fill #1
  Filled 2024-10-06: qty 30, 30d supply, fill #1
  Filled 2024-12-01: qty 30, 30d supply, fill #2

## 2024-04-17 MED ORDER — TORSEMIDE 20 MG PO TABS
20.0000 mg | ORAL_TABLET | Freq: Every day | ORAL | 1 refills | Status: AC
  Filled 2024-04-17: qty 90, 90d supply, fill #0
  Filled 2024-10-06: qty 30, 30d supply, fill #1
  Filled 2024-12-05: qty 30, 30d supply, fill #2

## 2024-04-17 MED ORDER — AMLODIPINE BESYLATE 5 MG PO TABS
5.0000 mg | ORAL_TABLET | Freq: Every day | ORAL | 1 refills | Status: DC
  Filled 2024-04-17: qty 90, 90d supply, fill #0
  Filled 2024-08-27: qty 90, 90d supply, fill #1

## 2024-04-17 MED ORDER — POTASSIUM CHLORIDE ER 10 MEQ PO TBCR
10.0000 meq | EXTENDED_RELEASE_TABLET | Freq: Three times a day (TID) | ORAL | 1 refills | Status: DC
  Filled 2024-04-17: qty 270, 90d supply, fill #0
  Filled 2024-04-18: qty 90, 30d supply, fill #0
  Filled 2024-05-25: qty 90, 30d supply, fill #1

## 2024-04-17 NOTE — Patient Instructions (Signed)
 Hypertension, Adult High blood pressure (hypertension) is when the force of blood pumping through the arteries is too strong. The arteries are the blood vessels that carry blood from the heart throughout the body. Hypertension forces the heart to work harder to pump blood and may cause arteries to become narrow or stiff. Untreated or uncontrolled hypertension can lead to a heart attack, heart failure, a stroke, kidney disease, and other problems. A blood pressure reading consists of a higher number over a lower number. Ideally, your blood pressure should be below 120/80. The first ("top") number is called the systolic pressure. It is a measure of the pressure in your arteries as your heart beats. The second ("bottom") number is called the diastolic pressure. It is a measure of the pressure in your arteries as the heart relaxes. What are the causes? The exact cause of this condition is not known. There are some conditions that result in high blood pressure. What increases the risk? Certain factors may make you more likely to develop high blood pressure. Some of these risk factors are under your control, including: Smoking. Not getting enough exercise or physical activity. Being overweight. Having too much fat, sugar, calories, or salt (sodium) in your diet. Drinking too much alcohol. Other risk factors include: Having a personal history of heart disease, diabetes, high cholesterol, or kidney disease. Stress. Having a family history of high blood pressure and high cholesterol. Having obstructive sleep apnea. Age. The risk increases with age. What are the signs or symptoms? High blood pressure may not cause symptoms. Very high blood pressure (hypertensive crisis) may cause: Headache. Fast or irregular heartbeats (palpitations). Shortness of breath. Nosebleed. Nausea and vomiting. Vision changes. Severe chest pain, dizziness, and seizures. How is this diagnosed? This condition is diagnosed by  measuring your blood pressure while you are seated, with your arm resting on a flat surface, your legs uncrossed, and your feet flat on the floor. The cuff of the blood pressure monitor will be placed directly against the skin of your upper arm at the level of your heart. Blood pressure should be measured at least twice using the same arm. Certain conditions can cause a difference in blood pressure between your right and left arms. If you have a high blood pressure reading during one visit or you have normal blood pressure with other risk factors, you may be asked to: Return on a different day to have your blood pressure checked again. Monitor your blood pressure at home for 1 week or longer. If you are diagnosed with hypertension, you may have other blood or imaging tests to help your health care provider understand your overall risk for other conditions. How is this treated? This condition is treated by making healthy lifestyle changes, such as eating healthy foods, exercising more, and reducing your alcohol intake. You may be referred for counseling on a healthy diet and physical activity. Your health care provider may prescribe medicine if lifestyle changes are not enough to get your blood pressure under control and if: Your systolic blood pressure is above 130. Your diastolic blood pressure is above 80. Your personal target blood pressure may vary depending on your medical conditions, your age, and other factors. Follow these instructions at home: Eating and drinking  Eat a diet that is high in fiber and potassium, and low in sodium, added sugar, and fat. An example of this eating plan is called the DASH diet. DASH stands for Dietary Approaches to Stop Hypertension. To eat this way: Eat  plenty of fresh fruits and vegetables. Try to fill one half of your plate at each meal with fruits and vegetables. Eat whole grains, such as whole-wheat pasta, brown rice, or whole-grain bread. Fill about one  fourth of your plate with whole grains. Eat or drink low-fat dairy products, such as skim milk or low-fat yogurt. Avoid fatty cuts of meat, processed or cured meats, and poultry with skin. Fill about one fourth of your plate with lean proteins, such as fish, chicken without skin, beans, eggs, or tofu. Avoid pre-made and processed foods. These tend to be higher in sodium, added sugar, and fat. Reduce your daily sodium intake. Many people with hypertension should eat less than 1,500 mg of sodium a day. Do not drink alcohol if: Your health care provider tells you not to drink. You are pregnant, may be pregnant, or are planning to become pregnant. If you drink alcohol: Limit how much you have to: 0-1 drink a day for women. 0-2 drinks a day for men. Know how much alcohol is in your drink. In the [place] one drink equals one 12 oz bottle of beer (355 mL), one 5 oz glass of wine (148 mL), or one 1 oz glass of hard liquor (44 mL). Lifestyle  Work with your health care provider to maintain a healthy body weight or to lose weight. Ask what an ideal weight is for you. Get at least 30 minutes of exercise that causes your heart to beat faster (aerobic exercise) most days of the week. Activities may include walking, swimming, or biking. Include exercise to strengthen your muscles (resistance exercise), such as Pilates or lifting weights, as part of your weekly exercise routine. Try to do these types of exercises for 30 minutes at least 3 days a week. Do not use any products that contain nicotine or tobacco. These products include cigarettes, chewing tobacco, and vaping devices, such as e-cigarettes. If you need help quitting, ask your health care provider. Monitor your blood pressure at home as told by your health care provider. Keep all follow-up visits. This is important. Medicines Take over-the-counter and prescription medicines only as told by your health care provider. Follow directions carefully. Blood  pressure medicines must be taken as prescribed. Do not skip doses of blood pressure medicine. Doing this puts you at risk for problems and can make the medicine less effective. Ask your health care provider about side effects or reactions to medicines that you should watch for. Contact a health care provider if you: Think you are having a reaction to a medicine you are taking. Have headaches that keep coming back (recurring). Feel dizzy. Have swelling in your ankles. Have trouble with your vision. Get help right away if you: Develop a severe headache or confusion. Have unusual weakness or numbness. Feel faint. Have severe pain in your chest or abdomen. Vomit repeatedly. Have trouble breathing. These symptoms may be an emergency. Get help right away. Call 911. Do not wait to see if the symptoms will go away. Do not drive yourself to the hospital. Summary Hypertension is when the force of blood pumping through your arteries is too strong. If this condition is not controlled, it may put you at risk for serious complications. Your personal target blood pressure may vary depending on your medical conditions, your age, and other factors. For most people, a normal blood pressure is less than 120/80. Hypertension is treated with lifestyle changes, medicines, or a combination of both. Lifestyle changes include losing weight, eating a healthy,  low-sodium diet, exercising more, and limiting alcohol. This information is not intended to replace advice given to you by your health care provider. Make sure you discuss any questions you have with your health care provider. Document Revised: 10/11/2021 Document Reviewed: 10/11/2021 Elsevier Patient Education  2024 [employer].

## 2024-04-17 NOTE — Progress Notes (Unsigned)
 Subjective:  Patient ID: Rita Harrison, female    DOB: 07/25/1981  Age: 43 y.o. MRN: [id number]  CC: Hypertension and Diabetes   HPI Rita Harrison presents for f/up ----  Discussed the use of AI scribe software for clinical note transcription with the patient, who gave verbal consent to proceed.  History of Present Illness   Rita Harrison is a 43 year old female with hypertension who presents for follow-up on weight management and blood pressure control.  She initially lost weight while taking phentermine  but has since regained it. She is interested in resuming phentermine  to aid in weight loss, particularly targeting her abdominal area. She is actively managing her weight through diet and exercise, including a high-protein diet, but notes that this may have contributed to constipation. No chest pain, shortness of breath, dizziness, or lightheadedness.  She has a history of hypertension and is currently taking amlodipine , nebivolol , and hydrochlorothiazide , all at low dosages. She also takes Lasix , which she started after experiencing swelling in her legs and feet following a two-week trip. She is unsure if the swelling persists as it was not noticeable to her until checked during the visit. No current chest pain or shortness of breath.  She experiences constipation, which she attributes to her high-protein diet and insufficient fiber and water intake. She uses a stool softener and reports having daily bowel movements, though sometimes with difficulty. No severe constipation where she is unable to have bowel movements.  Her menstrual cycles have been irregular, with the last cycle occurring on March 25. She identifies as being in perimenopause, noting irregular cycles over the past year. No chance of pregnancy.       Outpatient Medications Prior to Visit  Medication Sig Dispense Refill   ALPRAZolam  (XANAX ) 0.25 MG tablet Take 1 tablet (0.25 mg total) by mouth 2 (two) times  daily as needed for anxiety. 20 tablet 2   Aluminum  Chloride in Alcohol  6.25 % SOLN Apply 1 actuation topically at bedtime as directed 60 mL 3   botulinum toxin Type A  (BOTOX ) 200 units injection Inject 200 Units into the muscle every 3 (three) months. Inject 155 units IM into multiple site in the face,neck and head once every 90 days 1 each 4   clindamycin  (CLEOCIN  T) 1 % lotion APPLY A THIN LAYER TO THE AFFECTED AREA(S) 2 TIMES PER DAY AS NEEDED 60 mL 1   cyclobenzaprine  (FLEXERIL ) 10 MG tablet Take 1 tablet (10 mg total) by mouth 3 (three) times daily as needed for muscle spasms. 90 tablet 5   Fluocinolone  Acetonide Body (DERMA-SMOOTHE /FS BODY) 0.01 % OIL Apply topically at bedtime as directed. 118.28 mL 1   hydrocortisone  2.5 % cream Apply 1 Application topically 2 (two) times daily 1-2 weeks on/off as needed for flare 30 g 2   naratriptan  (AMERGE) 2.5 MG tablet Take 1 tablet (2.5 mg total) by mouth as needed for migraine. Take one (1) tablet at onset of headache; if returns or does not resolve, may repeat after 4 hours; do not exceed 2 tablets in 24 hours. 10 tablet 5   omeprazole  (PRILOSEC) 20 MG capsule Take 1 capsule (20 mg total) by mouth daily. 30 capsule 1   amLODipine  (NORVASC ) 5 MG tablet Take 1 tablet (5 mg total) by mouth daily. Must schedule an appt for further refills 30 tablet 0   furosemide  (LASIX ) 20 MG tablet Take 1 tablet (20 mg total) by mouth daily as needed for edema. 30 tablet 1  nebivolol  (BYSTOLIC ) 10 MG tablet Take 1 tablet (10 mg total) by mouth daily. 90 tablet 1   phentermine  (ADIPEX-P ) 37.5 MG tablet Take 1 tablet (37.5 mg total) by mouth daily before breakfast. 30 tablet 0   prochlorperazine  (COMPAZINE ) 5 MG tablet Take 1 tablet (5 mg) by mouth every 6 hours as needed for nausea or vomiting. 30 tablet 0   triamterene -hydrochlorothiazide  (DYAZIDE ) 37.5-25 MG capsule Take 1 capsule by mouth daily. 90 capsule 0   amoxicillin  (AMOXIL ) 500 MG capsule Take 1 capsule (500  mg total) by mouth 3 (three) times daily. 21 capsule 0   gabapentin  (NEURONTIN ) 300 MG capsule Take 3 capsules (900 mg total) by mouth at bedtime. 90 capsule 5   modafinil  (PROVIGIL ) 100 MG tablet Take 1 tablet (100 mg total) by mouth daily. 90 tablet 0   potassium chloride  (KLOR-CON  M) 10 MEQ tablet Take 1 tablet (10 mEq total) by mouth 2 (two) times daily. 180 tablet 1   terbinafine  (LAMISIL ) 250 MG tablet Take 1 tablet (250 mg total) by mouth daily. 90 tablet 0   No facility-administered medications prior to visit.    ROS Review of Systems  Constitutional:  Positive for unexpected weight change (wt gain). Negative for appetite change, chills, diaphoresis and fatigue.  HENT: Negative.    Eyes:  Negative for visual disturbance.  Respiratory: Negative.  Negative for cough, chest tightness, shortness of breath and wheezing.   Cardiovascular:  Negative for chest pain, palpitations and leg swelling.  Gastrointestinal:  Positive for constipation. Negative for abdominal pain, blood in stool, diarrhea, nausea and vomiting.  Genitourinary: Negative.  Negative for difficulty urinating and dysuria.  Musculoskeletal: Negative.  Negative for arthralgias and myalgias.  Skin: Negative.   Neurological: Negative.  Negative for dizziness and weakness.  Hematological:  Negative for adenopathy. Does not bruise/bleed easily.  Psychiatric/Behavioral: Negative.      Objective:  BP (!) 138/96 (BP Location: Left Arm, Patient Position: Sitting, Cuff Size: Normal)   Pulse 72   Temp 98.3 F (36.8 C) (Oral)   Resp 16   Ht 5\' 8"  (1.727 m)   Wt 212 lb 9.6 oz (96.4 kg)   LMP 03/11/2024 (Exact Date)   SpO2 96%   BMI 32.33 kg/m   BP Readings from Last 3 Encounters:  04/17/24 (!) 138/96  03/25/24 130/82  01/14/24 125/80    Wt Readings from Last 3 Encounters:  04/17/24 212 lb 9.6 oz (96.4 kg)  03/25/24 212 lb 6.4 oz (96.3 kg)  01/14/24 200 lb (90.7 kg)    Physical Exam Vitals reviewed.   Constitutional:      Appearance: Normal appearance.  Eyes:     General: No scleral icterus.    Pupils: Pupils are equal, round, and reactive to light.  Cardiovascular:     Rate and Rhythm: Normal rate and regular rhythm.     Heart sounds: No murmur heard.    No friction rub. No gallop.     Comments: EKG---  NSR, 69 bpm No LVH, Q waves, or ST/T wave changes  Pulmonary:     Effort: Pulmonary effort is normal.     Breath sounds: No stridor. No wheezing, rhonchi or rales.  Abdominal:     General: Abdomen is protuberant. Bowel sounds are normal. There is no distension.     Palpations: There is no hepatomegaly, splenomegaly or mass.     Tenderness: There is no abdominal tenderness. There is no guarding.     Hernia: No hernia is present.  Musculoskeletal:        General: Normal range of motion.     Right lower leg: No edema.     Left lower leg: No edema.  Lymphadenopathy:     Cervical: No cervical adenopathy.  Skin:    General: Skin is warm and dry.  Neurological:     General: No focal deficit present.     Mental Status: She is alert. Mental status is at baseline.  Psychiatric:        Mood and Affect: Mood normal.        Behavior: Behavior normal.     Lab Results  Component Value Date   WBC 9.3 03/25/2024   HGB 13.3 03/25/2024   HCT 39.3 03/25/2024   PLT 296.0 03/25/2024   GLUCOSE 98 03/25/2024   CHOL 176 04/17/2024   TRIG 216.0 (H) 04/17/2024   HDL 34.60 (L) 04/17/2024   LDLCALC 98 04/17/2024   ALT 42 (H) 03/25/2024   AST 25 03/25/2024   NA 137 03/25/2024   K 3.9 03/25/2024   CL 102 03/25/2024   CREATININE 0.94 03/25/2024   BUN 20 03/25/2024   CO2 25 03/25/2024   TSH 0.79 04/17/2024   INR 1.08 04/23/2013   HGBA1C 6.7 (H) 04/17/2024    VAS US  LOWER EXTREMITY VENOUS (DVT) Result Date: 04/01/2024  Lower Venous DVT Study Patient Name:  Rita Harrison  Date of Exam:   04/01/2024 Medical Rec #: [id number]          Accession #:    [id number] Date of Birth:  [birth year]          Patient Gender: F Patient Age:   47 years Exam Location:  [facility] Procedure:      VAS US  LOWER EXTREMITY VENOUS (DVT) Referring Phys: Trevor Fudge MATTHEWS --------------------------------------------------------------------------------  Indications: Edema.  Comparison Study: No prior study Performing Technologist: Delford Felling MHA, RDMS, RVT, RDCS  Examination Guidelines: A complete evaluation includes B-mode imaging, spectral Doppler, color Doppler, and power Doppler as needed of all accessible portions of each vessel. Bilateral testing is considered an integral part of a complete examination. Limited examinations for reoccurring indications may be performed as noted. The reflux portion of the exam is performed with the patient in reverse Trendelenburg.  +---------+---------------+---------+-----------+----------+--------------+ RIGHT    CompressibilityPhasicitySpontaneityPropertiesThrombus Aging +---------+---------------+---------+-----------+----------+--------------+ CFV      Full           Yes      Yes                                 +---------+---------------+---------+-----------+----------+--------------+ SFJ      Full                                                        +---------+---------------+---------+-----------+----------+--------------+ FV Prox  Full                                                        +---------+---------------+---------+-----------+----------+--------------+ FV Mid   Full           Yes      Yes                                 +---------+---------------+---------+-----------+----------+--------------+  FV DistalFull                                                        +---------+---------------+---------+-----------+----------+--------------+ PFV      Full                                                         +---------+---------------+---------+-----------+----------+--------------+ POP      Full           Yes      Yes                                 +---------+---------------+---------+-----------+----------+--------------+ PTV      Full                                                        +---------+---------------+---------+-----------+----------+--------------+ PERO     Full                                                        +---------+---------------+---------+-----------+----------+--------------+   +---------+---------------+---------+-----------+----------+--------------+ LEFT     CompressibilityPhasicitySpontaneityPropertiesThrombus Aging +---------+---------------+---------+-----------+----------+--------------+ CFV      Full           Yes      Yes                                 +---------+---------------+---------+-----------+----------+--------------+ SFJ      Full                                                        +---------+---------------+---------+-----------+----------+--------------+ FV Prox  Full                                                        +---------+---------------+---------+-----------+----------+--------------+ FV Mid   Full           Yes      Yes                                 +---------+---------------+---------+-----------+----------+--------------+ FV DistalFull                                                        +---------+---------------+---------+-----------+----------+--------------+  PFV      Full                                                        +---------+---------------+---------+-----------+----------+--------------+ POP      Full           Yes      Yes                                 +---------+---------------+---------+-----------+----------+--------------+ PTV      Full                                                         +---------+---------------+---------+-----------+----------+--------------+ PERO     Full                                                        +---------+---------------+---------+-----------+----------+--------------+  Summary: RIGHT: - There is no evidence of deep vein thrombosis in the lower extremity.  - No cystic structure found in the popliteal fossa.  LEFT: - There is no evidence of deep vein thrombosis in the lower extremity.  - No cystic structure found in the popliteal fossa.  *See table(s) above for measurements and observations. Electronically signed by Jimmye Moulds MD on 04/01/2024 at 4:32:23 PM.    Final     Assessment & Plan:  Hyperlipidemia with target LDL less than 130 -     Lipid panel; Future -     TSH; Future -     hCG, quantitative, pregnancy; Future  Obesity (BMI 30.0-34.9) -     Hemoglobin A1c; Future -     hCG, quantitative, pregnancy; Future  Screening for colon cancer -     Ambulatory referral to Gastroenterology  Primary hypertension -     Urinalysis, Routine w reflex microscopic; Future -     hCG, quantitative, pregnancy; Future -     EKG 12-Lead -     Torsemide ; Take 1 tablet (20 mg total) by mouth daily.  Dispense: 90 tablet; Refill: 1 -     AMB Referral VBCI Care Management -     Potassium Chloride  ER; Take 1 tablet (10 mEq total) by mouth 3 (three) times daily.  Dispense: 270 tablet; Refill: 1 -     amLODIPine  Besylate; Take 1 tablet (5 mg total) by mouth daily.  Dispense: 90 tablet; Refill: 1 -     Nebivolol  HCl; Take 1 tablet (10 mg total) by mouth daily.  Dispense: 90 tablet; Refill: 1  Type 2 diabetes mellitus with other circulatory complication, without long-term current use of insulin (HCC) -     Tirzepatide ; Inject 2.5 mg into the skin once a week.  Dispense: 2 mL; Refill: 0 -     Ambulatory referral to Ophthalmology     Follow-up: Return in about 6 months (around 10/18/2024).  Sandra Crouch, MD

## 2024-04-18 ENCOUNTER — Encounter: Payer: Self-pay | Admitting: Internal Medicine

## 2024-04-18 ENCOUNTER — Other Ambulatory Visit (HOSPITAL_COMMUNITY): Payer: Self-pay

## 2024-04-18 ENCOUNTER — Other Ambulatory Visit: Payer: Self-pay

## 2024-04-18 DIAGNOSIS — E119 Type 2 diabetes mellitus without complications: Secondary | ICD-10-CM | POA: Insufficient documentation

## 2024-04-18 HISTORY — DX: Type 2 diabetes mellitus without complications: E11.9

## 2024-04-18 LAB — URINALYSIS, ROUTINE W REFLEX MICROSCOPIC
Bilirubin Urine: NEGATIVE
Hgb urine dipstick: NEGATIVE
Ketones, ur: NEGATIVE
Leukocytes,Ua: NEGATIVE
Nitrite: NEGATIVE
Specific Gravity, Urine: 1.015 (ref 1.000–1.030)
Total Protein, Urine: NEGATIVE
Urine Glucose: NEGATIVE
Urobilinogen, UA: 0.2 (ref 0.0–1.0)
pH: 6 (ref 5.0–8.0)

## 2024-04-18 LAB — HEMOGLOBIN A1C: Hgb A1c MFr Bld: 6.7 % — ABNORMAL HIGH (ref 4.6–6.5)

## 2024-04-18 MED ORDER — TIRZEPATIDE 2.5 MG/0.5ML ~~LOC~~ SOAJ
2.5000 mg | SUBCUTANEOUS | 0 refills | Status: DC
  Filled 2024-04-18: qty 2, 28d supply, fill #0

## 2024-04-23 ENCOUNTER — Other Ambulatory Visit: Payer: Self-pay

## 2024-04-23 ENCOUNTER — Other Ambulatory Visit (HOSPITAL_COMMUNITY): Payer: Self-pay

## 2024-04-23 MED ORDER — ROSUVASTATIN CALCIUM 10 MG PO TABS
10.0000 mg | ORAL_TABLET | Freq: Every day | ORAL | 1 refills | Status: DC
  Filled 2024-04-23: qty 90, 90d supply, fill #0

## 2024-04-25 ENCOUNTER — Telehealth: Payer: Self-pay | Admitting: *Deleted

## 2024-04-25 NOTE — Progress Notes (Unsigned)
 Care Guide Pharmacy Note  04/25/2024 Name: KENNISHA CEASAR MRN: [id number] DOB: 1981/03/05  Referred By: Arcadio Knuckles, MD Reason for referral: Complex Care Management and Call Attempt #1 (Outreach to schedule referral with pharmacist )   Rita Harrison is a 43 y.o. year old female who is a primary care patient of Arcadio Knuckles, MD.  Rita Harrison was referred to the pharmacist for assistance related to: HTN  An unsuccessful telephone outreach was attempted today to contact the patient who was referred to the pharmacy team for assistance with medication management. Additional attempts will be made to contact the patient.  Kandis Ormond, CMA [place]  [facility], [facility] Guide Direct Dial: [phone]  Fax: [phone] Website: [email or web address]

## 2024-04-28 NOTE — Progress Notes (Signed)
 Care Guide Pharmacy Note  04/28/2024 Name: Rita Harrison MRN: [id number] DOB: 25-Feb-1981  Referred By: Arcadio Knuckles, MD Reason for referral: Complex Care Management and Call Attempt #1 (Outreach to schedule referral with pharmacist )   Rita Harrison is a 43 y.o. year old female who is a primary care patient of Arcadio Knuckles, MD.  Rita Harrison was referred to the pharmacist for assistance related to: HTN and DMII  Successful contact was made with the patient to discuss pharmacy services including being ready for the pharmacist to call at least 5 minutes before the scheduled appointment time and to have medication bottles and any blood pressure readings ready for review. The patient agreed to meet with the pharmacist via telephone visit on 05/05/2024  Kandis Ormond, CMA [place]  [facility], [facility] Guide Direct Dial: [phone]  Fax: [phone] Website: [email or web address]

## 2024-04-29 ENCOUNTER — Ambulatory Visit: Admitting: Internal Medicine

## 2024-05-05 ENCOUNTER — Other Ambulatory Visit (HOSPITAL_COMMUNITY): Payer: Self-pay

## 2024-05-05 ENCOUNTER — Encounter: Payer: Self-pay | Admitting: Gastroenterology

## 2024-05-05 ENCOUNTER — Other Ambulatory Visit: Admitting: Pharmacist

## 2024-05-05 DIAGNOSIS — E1159 Type 2 diabetes mellitus with other circulatory complications: Secondary | ICD-10-CM

## 2024-05-05 MED ORDER — TIRZEPATIDE 5 MG/0.5ML ~~LOC~~ SOAJ
5.0000 mg | SUBCUTANEOUS | 0 refills | Status: DC
  Filled 2024-05-05 – 2024-05-13 (×2): qty 2, 28d supply, fill #0

## 2024-05-05 NOTE — Progress Notes (Signed)
 05/05/2024 Name: Rita Harrison MRN: [id number] DOB: [birth year]  Chief Complaint  Patient presents with   Diabetes   Hypertension   Hyperlipidemia   Medication Management    Rita Harrison is a 43 y.o. year old female who presented for a telephone visit.   They were referred to the pharmacist by their PCP for assistance in managing diabetes, hypertension, and hyperlipidemia.    Subjective:  Care Team: Primary Care Provider: Arcadio Knuckles, MD ; Next Scheduled Visit: none scheduled   Medication Access/Adherence  Current Pharmacy:  [facility] [address] [place] [address] Phone: [phone] Fax: [phone]  [facility] [address] [place] [address] Phone: [phone] Fax: [phone]   Patient reports affordability concerns with their medications: No  Patient reports access/transportation concerns to their pharmacy: No  Patient reports adherence concerns with their medications:  No     Diabetes: *New diagnosis as of 5/1 labs Current medications: Mounjaro  2.5 mg weekly (started 3 weeks ago)  *She notes she has tolerated Mounjaro . Does report some nausea the first week or so. She reports an 11 lb weight loss.  Current meal patterns: Pt eats high protein/high fiber, tries to minimize sugar/carbs   Current physical activity: Treadmill and bike, squats with weights. 3x per week   Hypertension:  Current medications: amlodipine  5 mg daily, nebivolol  10 mg daily, torsemide  20 mg daily  She notes swelling has improved since starting torsemide   Patient does not have a validated, automated, upper arm home BP cuff Current blood pressure  readings: has not checked   Hyperlipidemia/ASCVD Risk Reduction  Current lipid lowering medications: none Was unsure why she was prescribed rosuvastatin , therefor has not started  The 10-year ASCVD risk score (Arnett  DK, et al., 2019) is: 15.7%   Values used to calculate the score:     Age: 28 years     Sex: Female     Is Non-Hispanic African American: Yes     Diabetic: Yes     Tobacco smoker: No     Systolic Blood Pressure: 138 mmHg     Is BP treated: Yes     HDL Cholesterol: 34.6 mg/dL     Total Cholesterol: 176 mg/dL    Objective:  Lab Results  Component Value Date   HGBA1C 6.7 (H) 04/17/2024    Lab Results  Component Value Date   CREATININE 0.94 03/25/2024   BUN 20 03/25/2024   NA 137 03/25/2024   K 3.9 03/25/2024   CL 102 03/25/2024   CO2 25 03/25/2024    Lab Results  Component Value Date   CHOL 176 04/17/2024   HDL 34.60 (L) 04/17/2024   LDLCALC 98 04/17/2024   TRIG 216.0 (H) 04/17/2024   CHOLHDL 5 04/17/2024    Medications Reviewed Today     Reviewed by Dion Frankel, RPH (Pharmacist) on 05/05/24 at 1627  Med List Status: <None>   Medication Order Taking? Sig Documenting Provider Last Dose Status Informant  ALPRAZolam  (XANAX ) 0.25 MG tablet [id number]  Take 1 tablet (0.25 mg total) by mouth 2 (two) times daily as needed for anxiety. Arcadio Knuckles, MD  Active   Aluminum  Chloride in Alcohol  6.25 % SOLN [id number]  Apply 1 actuation topically at bedtime as directed Arcadio Knuckles, MD  Active   amLODipine  (NORVASC ) 5 MG tablet [id number] Yes Take 1 tablet (5 mg total) by mouth daily. Arcadio Knuckles, MD Taking  Active   botulinum toxin Type A  (BOTOX ) 200 units injection [id number]  Inject 200 Units into the muscle every 3 (three) months. Inject 155 units IM into multiple site in the face,neck and head once every 90 days Janne Members R, DO  Active   clindamycin  (CLEOCIN  T) 1 % lotion [id number]  APPLY A THIN LAYER TO THE AFFECTED AREA(S) 2 TIMES PER DAY AS NEEDED   Active   cyclobenzaprine  (FLEXERIL ) 10 MG tablet [id number]  Take 1 tablet (10 mg total) by mouth 3 (three) times daily as needed for muscle spasms. Merriam Abbey, DO  Active   Fluocinolone  Acetonide Body  (DERMA-SMOOTHE /FS BODY) 0.01 % OIL [id number]  Apply topically at bedtime as directed. Arcadio Knuckles, MD  Active   hydrocortisone  2.5 % cream [id number]  Apply 1 Application topically 2 (two) times daily 1-2 weeks on/off as needed for flare   Active   naratriptan  (AMERGE) 2.5 MG tablet [id number]  Take 1 tablet (2.5 mg total) by mouth as needed for migraine. Take one (1) tablet at onset of headache; if returns or does not resolve, may repeat after 4 hours; do not exceed 2 tablets in 24 hours. Merriam Abbey, DO  Active   nebivolol  (BYSTOLIC ) 10 MG tablet [id number] Yes Take 1 tablet (10 mg total) by mouth daily. Arcadio Knuckles, MD Taking Active   omeprazole  (PRILOSEC) 20 MG capsule [id number]  Take 1 capsule (20 mg total) by mouth daily. Arcadio Knuckles, MD  Active   potassium chloride  (KLOR-CON  10) 10 MEQ tablet [id number] Yes Take 1 tablet (10 mEq total) by mouth 3 (three) times daily. Arcadio Knuckles, MD Taking Active   rosuvastatin  (CRESTOR ) 10 MG tablet [id number] No Take 1 tablet (10 mg total) by mouth daily.  Patient not taking: Reported on 05/05/2024   Arcadio Knuckles, MD Not Taking Active   tirzepatide  (MOUNJARO ) 2.5 MG/0.5ML Pen [id number] Yes Inject 2.5 mg into the skin once a week. Arcadio Knuckles, MD Taking Active   torsemide  (DEMADEX ) 20 MG tablet [id number] Yes Take 1 tablet (20 mg total) by mouth daily. Arcadio Knuckles, MD Taking Active               Assessment/Plan:   Diabetes: - Currently controlled, A1c goal <7% - Reviewed long term cardiovascular and renal outcomes of uncontrolled blood sugar - Reviewed goal A1c - Reviewed dietary modifications including high protein/high fiber - Reviewed lifestyle modifications including: 150 min exercise per week w/ 2x per week strength training - Recommend to increase Mounjaro  to 5 mg weekly after completing 4 weeks of 2.5 mg     Hypertension: - Currently uncontrolled, BP goal <130/80 - Reviewed long term cardiovascular and renal  outcomes of uncontrolled blood pressure - Reviewed appropriate blood pressure monitoring technique and reviewed goal blood pressure. Recommended to check home blood pressure and heart rate often - Recommend to continue current regimen   Hyperlipidemia/ASCVD Risk Reduction: - Currently uncontrolled. LDL goal <70 given new dx diabetes, TG goal <150 - Reviewed long term complications of uncontrolled cholesterol - Recommend to start rosuvastatin , monitor for myalgias    Follow Up Plan: 6/20  Rainelle Bur, PharmD, BCPS, CPP Clinical Pharmacist Practitioner [facility] [phone]

## 2024-05-05 NOTE — Patient Instructions (Signed)
 It was a pleasure speaking with you today!  Increase Mounjaro  to 5 mg weekly after you complete 4 weeks of Mounjaro  2.5 mg weekly. Contact me if you experience intolerable nausea/vomiting that does not resolve after a couple of weeks. Be sure to eat small meals and eat slowly, avoiding high fat foods as well.  Start rosuvastatin  10 mg daily for cholesterol and heart protection.  Feel free to call with any questions or concerns!  Rainelle Bur, PharmD, BCPS, CPP Clinical Pharmacist Practitioner [facility] at [facility] [phone]

## 2024-05-09 ENCOUNTER — Ambulatory Visit (AMBULATORY_SURGERY_CENTER): Admitting: *Deleted

## 2024-05-09 ENCOUNTER — Other Ambulatory Visit (HOSPITAL_COMMUNITY): Payer: Self-pay

## 2024-05-09 ENCOUNTER — Other Ambulatory Visit (HOSPITAL_BASED_OUTPATIENT_CLINIC_OR_DEPARTMENT_OTHER): Payer: Self-pay

## 2024-05-09 ENCOUNTER — Encounter: Payer: Self-pay | Admitting: Gastroenterology

## 2024-05-09 VITALS — Ht 68.0 in | Wt 205.0 lb

## 2024-05-09 DIAGNOSIS — Z1211 Encounter for screening for malignant neoplasm of colon: Secondary | ICD-10-CM

## 2024-05-09 MED ORDER — NA SULFATE-K SULFATE-MG SULF 17.5-3.13-1.6 GM/177ML PO SOLN
1.0000 | Freq: Once | ORAL | 0 refills | Status: AC
  Filled 2024-05-09: qty 354, 1d supply, fill #0

## 2024-05-09 NOTE — Progress Notes (Signed)
 Pt's name and DOB verified at the beginning of the pre-visit wit 2 identifiers  Pt denies any difficulty with ambulating,sitting, laying down or rolling side to side  Pt has no issues moving head neck or swallowing  No egg or soy allergy known to patient   Patient denies ever being intubated  No FH of Malignant Hyperthermia  Pt is not on home 02   Pt is not on blood thinners   Pt denies issues with constipation   Pt is not on dialysis  Pt denise any abnormal heart rhythms   Pt denies any upcoming cardiac testing  Chart not reviewed by CRNA prior to [facility]  Visit by phone  Pt states weight is 208 lb   IInstructions reviewed. Pt given  both [facility] main # and MD on call # prior to instructions.  Pt states understanding of instructions. Instructed pt to review instructions again prior to procedure and call main # given if has questions.. Pt states they will.   Instructed pt on where to find instructions on My Chart.

## 2024-05-13 ENCOUNTER — Other Ambulatory Visit: Payer: Self-pay | Admitting: Internal Medicine

## 2024-05-13 ENCOUNTER — Other Ambulatory Visit (HOSPITAL_COMMUNITY): Payer: Self-pay

## 2024-05-13 ENCOUNTER — Encounter: Payer: Self-pay | Admitting: Internal Medicine

## 2024-05-13 ENCOUNTER — Other Ambulatory Visit: Payer: Self-pay | Admitting: Neurology

## 2024-05-13 ENCOUNTER — Other Ambulatory Visit: Payer: Self-pay

## 2024-05-13 DIAGNOSIS — G43709 Chronic migraine without aura, not intractable, without status migrainosus: Secondary | ICD-10-CM

## 2024-05-13 MED ORDER — BOTOX 200 UNITS IJ SOLR
200.0000 [IU] | INTRAMUSCULAR | 4 refills | Status: AC
  Filled 2024-05-13 – 2024-05-23 (×2): qty 1, 90d supply, fill #0
  Filled 2024-06-09 – 2024-08-26 (×2): qty 1, 90d supply, fill #1
  Filled 2024-11-20: qty 1, 90d supply, fill #2

## 2024-05-23 ENCOUNTER — Other Ambulatory Visit: Payer: Self-pay

## 2024-05-23 NOTE — Progress Notes (Signed)
 Specialty Pharmacy Refill Coordination Note  Ralynn MICA RAMDASS is a 43 y.o. female assessed today regarding refills of clinic administered specialty medication(s) OnabotulinumtoxinA  (Botox )   Clinic requested Courier to Provider Office   Delivery date: 06/02/24   Verified address: [facility] [address]   Medication will be filled on 06.13.25.

## 2024-05-26 ENCOUNTER — Ambulatory Visit (AMBULATORY_SURGERY_CENTER): Admitting: Gastroenterology

## 2024-05-26 ENCOUNTER — Other Ambulatory Visit: Payer: Self-pay

## 2024-05-26 ENCOUNTER — Encounter: Payer: Self-pay | Admitting: Gastroenterology

## 2024-05-26 ENCOUNTER — Other Ambulatory Visit (HOSPITAL_COMMUNITY): Payer: Self-pay

## 2024-05-26 VITALS — BP 126/81 | HR 73 | Temp 98.0°F | Resp 14 | Ht 68.0 in | Wt 208.0 lb

## 2024-05-26 DIAGNOSIS — K635 Polyp of colon: Secondary | ICD-10-CM | POA: Diagnosis not present

## 2024-05-26 DIAGNOSIS — E119 Type 2 diabetes mellitus without complications: Secondary | ICD-10-CM | POA: Diagnosis not present

## 2024-05-26 DIAGNOSIS — K5730 Diverticulosis of large intestine without perforation or abscess without bleeding: Secondary | ICD-10-CM

## 2024-05-26 DIAGNOSIS — Z1211 Encounter for screening for malignant neoplasm of colon: Secondary | ICD-10-CM | POA: Diagnosis not present

## 2024-05-26 DIAGNOSIS — D125 Benign neoplasm of sigmoid colon: Secondary | ICD-10-CM | POA: Diagnosis not present

## 2024-05-26 DIAGNOSIS — I10 Essential (primary) hypertension: Secondary | ICD-10-CM | POA: Diagnosis not present

## 2024-05-26 DIAGNOSIS — F419 Anxiety disorder, unspecified: Secondary | ICD-10-CM | POA: Diagnosis not present

## 2024-05-26 MED ORDER — SODIUM CHLORIDE 0.9 % IV SOLN: 500.0000 mL | Freq: Once | INTRAVENOUS | Status: DC

## 2024-05-26 NOTE — Patient Instructions (Signed)
 Resume previous diet Return to normal activities tomorrow Return to GI office as needed    YOU HAD AN ENDOSCOPIC PROCEDURE TODAY AT THE [facility]:   Refer to the procedure report that was given to you for any specific questions about what was found during the examination.  If the procedure report does not answer your questions, please call your gastroenterologist to clarify.  If you requested that your care partner not be given the details of your procedure findings, then the procedure report has been included in a sealed envelope for you to review at your convenience later.  YOU SHOULD EXPECT: Some feelings of bloating in the abdomen. Passage of more gas than usual.  Walking can help get rid of the air that was put into your GI tract during the procedure and reduce the bloating. If you had a lower endoscopy (such as a colonoscopy or flexible sigmoidoscopy) you may notice spotting of blood in your stool or on the toilet paper. If you underwent a bowel prep for your procedure, you may not have a normal bowel movement for a few days.  Please Note:  You might notice some irritation and congestion in your nose or some drainage.  This is from the oxygen used during your procedure.  There is no need for concern and it should clear up in a day or so.  SYMPTOMS TO REPORT IMMEDIATELY:  Following lower endoscopy (colonoscopy or flexible sigmoidoscopy):  Excessive amounts of blood in the stool  Significant tenderness or worsening of abdominal pains  Swelling of the abdomen that is new, acute  Fever of 100F or higher   For urgent or emergent issues, a gastroenterologist can be reached at any hour by calling [phone]. Do not use MyChart messaging for urgent concerns.    DIET:  We do recommend a small meal at first, but then you may proceed to your regular diet.  Drink plenty of fluids but you should avoid alcoholic beverages for 24 hours.  ACTIVITY:  You should plan to take it  easy for the rest of today and you should NOT DRIVE or use heavy machinery until tomorrow (because of the sedation medicines used during the test).    FOLLOW UP: Our staff will call the number listed on your records the next business day following your procedure.  We will call around 7:15- 8:00 am to check on you and address any questions or concerns that you may have regarding the information given to you following your procedure. If we do not reach you, we will leave a message.     If any biopsies were taken you will be contacted by phone or by letter within the next 1-3 weeks.  Please call us  at [phone] if you have not heard about the biopsies in 3 weeks.    SIGNATURES/CONFIDENTIALITY: You and/or your care partner have signed paperwork which will be entered into your electronic medical record.  These signatures attest to the fact that that the information above on your After Visit Summary has been reviewed and is understood.  Full responsibility of the confidentiality of this discharge information lies with you and/or your care-partner.

## 2024-05-26 NOTE — Op Note (Signed)
 [facility] Patient Name: Rita Harrison Procedure Date: 05/26/2024 11:46 AM MRN: [id number] Endoscopist: Harry Lindau , MD, [id number] Age: 43 Referring MD:  Date of Birth: 03-Dec-1981 Gender: Female Account #: [id number] Procedure:                Colonoscopy Indications:              Screening for colorectal malignant neoplasm, This                            is the patient's first colonoscopy Medicines:                Monitored Anesthesia Care Procedure:                Pre-Anesthesia Assessment:                           - Prior to the procedure, a History and Physical                            was performed, and patient medications and                            allergies were reviewed. The patient's tolerance of                            previous anesthesia was also reviewed. The risks                            and benefits of the procedure and the sedation                            options and risks were discussed with the patient.                            All questions were answered, and informed consent                            was obtained. Prior Anticoagulants: The patient has                            taken no anticoagulant or antiplatelet agents. ASA                            Grade Assessment: II - A patient with mild systemic                            disease. After reviewing the risks and benefits,                            the patient was deemed in satisfactory condition to                            undergo the procedure.  After obtaining informed consent, the colonoscope                            was passed under direct vision. Throughout the                            procedure, the patient's blood pressure, pulse, and                            oxygen saturations were monitored continuously. The                            Colonoscope was introduced through the anus and                            advanced to the. The  Colonoscope was introduced                            through the anus and advanced to the the cecum,                            identified by appendiceal orifice and ileocecal                            valve. The colonoscopy was performed without                            difficulty. The patient tolerated the procedure                            well. The quality of the bowel preparation was                            good. The ileocecal valve, appendiceal orifice, and                            rectum were photographed. Scope In: 11:51:00 AM Scope Out: 12:03:33 PM Scope Withdrawal Time: 0 hours 10 minutes 10 seconds  Total Procedure Duration: 0 hours 12 minutes 33 seconds  Findings:                 The perianal and digital rectal examinations were                            normal.                           Three sessile polyps were found in the sigmoid                            colon. The polyps were 2 to 4 mm in size. These                            polyps were removed with a cold snare. Resection  and retrieval were complete. Estimated blood loss                            was minimal.                           A few small-mouthed diverticula were found in the                            sigmoid colon.                           The retroflexed view of the distal rectum and anal                            verge was normal and showed no anal or rectal                            abnormalities. Complications:            No immediate complications. Estimated Blood Loss:     Estimated blood loss was minimal. Impression:               - Three 2 to 4 mm polyps in the sigmoid colon,                            removed with a cold snare. Resected and retrieved.                           - Diverticulosis in the sigmoid colon.                           - The remainder of the colon was otherwise normal                            appearing.                            - The distal rectum and anal verge are normal on                            retroflexion view. Recommendation:           - Patient has a contact number available for                            emergencies. The signs and symptoms of potential                            delayed complications were discussed with the                            patient. Return to normal activities tomorrow.                            Written discharge instructions were provided to the  patient.                           - Resume previous diet.                           - Repeat colonoscopy in 5-10 years for surveillance                            based on pathology results.                           - Return to GI office PRN. Harry Lindau, MD 05/26/2024 12:08:03 PM

## 2024-05-26 NOTE — Progress Notes (Signed)
 Report given to PACU, vss

## 2024-05-26 NOTE — Progress Notes (Signed)
 Pt's states no medical or surgical changes since previsit or office visit.

## 2024-05-26 NOTE — Progress Notes (Signed)
 GASTROENTEROLOGY PROCEDURE H&P NOTE   Primary Care Physician: Arcadio Knuckles, MD    Reason for Procedure:  Colon Cancer screening  Plan:    Colonoscopy  Patient is appropriate for endoscopic procedure(s) in the ambulatory ([facility]) setting.  The nature of the procedure, as well as the risks, benefits, and alternatives were carefully and thoroughly reviewed with the patient. Ample time for discussion and questions allowed. The patient understood, was satisfied, and agreed to proceed.     HPI: Rita Harrison is a 43 y.o. female who presents for colonoscopy for routine Colon Cancer screening.  No active GI symptoms.  No known family history of colon cancer or related malignancy.  Patient is otherwise without complaints or active issues today.  Past Medical History:  Diagnosis Date   Anxiety    Arm DVT (deep venous thromboembolism), chronic (HCC)    RUE   Chicken pox    Diabetes mellitus (HCC) 04/18/2024   Encounter for general adult medical examination with abnormal findings 11/09/2020   Hypertension    Sickle cell anemia (HCC)    sickle cell trait    Past Surgical History:  Procedure Laterality Date   FOOT SURGERY Right     Prior to Admission medications   Medication Sig Start Date End Date Taking? Authorizing Provider  ALPRAZolam  (XANAX ) 0.25 MG tablet Take 1 tablet (0.25 mg total) by mouth 2 (two) times daily as needed for anxiety. 02/29/24  Yes Arcadio Knuckles, MD  Aluminum  Chloride in Alcohol  6.25 % SOLN Apply 1 actuation topically at bedtime as directed 03/06/22  Yes Arcadio Knuckles, MD  amLODipine  (NORVASC ) 5 MG tablet Take 1 tablet (5 mg total) by mouth daily. 04/17/24  Yes Arcadio Knuckles, MD  botulinum toxin Type A  (BOTOX ) 200 units injection Inject 200 Units into the muscle every 3 (three) months. Inject 155 units IM into multiple site in the face,neck and head once every 90 days 05/13/24  Yes Festus Hubert, Adam R, DO  Fluocinolone  Acetonide Body (DERMA-SMOOTHE /FS BODY)  0.01 % OIL Apply topically at bedtime as directed. 11/28/23  Yes Arcadio Knuckles, MD  Multiple Vitamin (MULTIVITAMIN ADULT PO) Take by mouth.   Yes [provider]  nebivolol  (BYSTOLIC ) 10 MG tablet Take 1 tablet (10 mg total) by mouth daily. 04/17/24  Yes Arcadio Knuckles, MD  omeprazole  (PRILOSEC) 20 MG capsule Take 1 capsule (20 mg total) by mouth daily. 08/13/23  Yes Arcadio Knuckles, MD  potassium chloride  (KLOR-CON  10) 10 MEQ tablet Take 1 tablet (10 mEq total) by mouth 3 (three) times daily. 04/17/24  Yes Arcadio Knuckles, MD  rosuvastatin  (CRESTOR ) 10 MG tablet Take 1 tablet (10 mg total) by mouth daily. 04/23/24  Yes Arcadio Knuckles, MD  torsemide  (DEMADEX ) 20 MG tablet Take 1 tablet (20 mg total) by mouth daily. 04/17/24  Yes Arcadio Knuckles, MD  vitamin B-12 (CYANOCOBALAMIN) 100 MCG tablet Take 100 mcg by mouth daily.   Yes [provider]  Biotin 3 MG TABS Take by mouth.    [provider]  clindamycin  (CLEOCIN  T) 1 % lotion APPLY A THIN LAYER TO THE AFFECTED AREA(S) 2 TIMES PER DAY AS NEEDED 07/14/22     cyclobenzaprine  (FLEXERIL ) 10 MG tablet Take 1 tablet (10 mg total) by mouth 3 (three) times daily as needed for muscle spasms. Patient not taking: Reported on 05/26/2024 11/02/22   Merriam Abbey, DO  hydrocortisone  2.5 % cream Apply 1 Application topically 2 (two) times  daily 1-2 weeks on/off as needed for flare 07/31/23     naratriptan  (AMERGE) 2.5 MG tablet Take 1 tablet (2.5 mg total) by mouth as needed for migraine. Take one (1) tablet at onset of headache; if returns or does not resolve, may repeat after 4 hours; do not exceed 2 tablets in 24 hours. Patient not taking: Reported on 05/26/2024 11/23/23   Merriam Abbey, DO  tirzepatide  (MOUNJARO ) 5 MG/0.5ML Pen Inject 5 mg into the skin once a week. 05/05/24   Elvira Hammersmith, MD    Current Outpatient Medications  Medication Sig Dispense Refill   ALPRAZolam  (XANAX ) 0.25 MG tablet Take 1 tablet (0.25 mg total) by mouth  2 (two) times daily as needed for anxiety. 20 tablet 2   Aluminum  Chloride in Alcohol  6.25 % SOLN Apply 1 actuation topically at bedtime as directed 60 mL 3   amLODipine  (NORVASC ) 5 MG tablet Take 1 tablet (5 mg total) by mouth daily. 90 tablet 1   botulinum toxin Type A  (BOTOX ) 200 units injection Inject 200 Units into the muscle every 3 (three) months. Inject 155 units IM into multiple site in the face,neck and head once every 90 days 1 each 4   Fluocinolone  Acetonide Body (DERMA-SMOOTHE /FS BODY) 0.01 % OIL Apply topically at bedtime as directed. 118.28 mL 1   Multiple Vitamin (MULTIVITAMIN ADULT PO) Take by mouth.     nebivolol  (BYSTOLIC ) 10 MG tablet Take 1 tablet (10 mg total) by mouth daily. 90 tablet 1   omeprazole  (PRILOSEC) 20 MG capsule Take 1 capsule (20 mg total) by mouth daily. 30 capsule 1   potassium chloride  (KLOR-CON  10) 10 MEQ tablet Take 1 tablet (10 mEq total) by mouth 3 (three) times daily. 270 tablet 1   rosuvastatin  (CRESTOR ) 10 MG tablet Take 1 tablet (10 mg total) by mouth daily. 90 tablet 1   torsemide  (DEMADEX ) 20 MG tablet Take 1 tablet (20 mg total) by mouth daily. 90 tablet 1   vitamin B-12 (CYANOCOBALAMIN) 100 MCG tablet Take 100 mcg by mouth daily.     Biotin 3 MG TABS Take by mouth.     clindamycin  (CLEOCIN  T) 1 % lotion APPLY A THIN LAYER TO THE AFFECTED AREA(S) 2 TIMES PER DAY AS NEEDED 60 mL 1   cyclobenzaprine  (FLEXERIL ) 10 MG tablet Take 1 tablet (10 mg total) by mouth 3 (three) times daily as needed for muscle spasms. (Patient not taking: Reported on 05/26/2024) 90 tablet 5   hydrocortisone  2.5 % cream Apply 1 Application topically 2 (two) times daily 1-2 weeks on/off as needed for flare 30 g 2   naratriptan  (AMERGE) 2.5 MG tablet Take 1 tablet (2.5 mg total) by mouth as needed for migraine. Take one (1) tablet at onset of headache; if returns or does not resolve, may repeat after 4 hours; do not exceed 2 tablets in 24 hours. (Patient not taking: Reported on  05/26/2024) 10 tablet 5   tirzepatide  (MOUNJARO ) 5 MG/0.5ML Pen Inject 5 mg into the skin once a week. 2 mL 0   Current Facility-Administered Medications  Medication Dose Route Frequency Provider Last Rate Last Admin   0.9 %  sodium chloride  infusion  500 mL Intravenous Once Cambrie Sonnenfeld V, DO        Allergies as of 05/26/2024 - Review Complete 05/26/2024  Allergen Reaction Noted   Lisinopril  Shortness Of Breath and Itching 02/23/2014   Indapamide  Rash 06/05/2022   Spironolactone  Other (See Comments) 07/14/2022    Family History  Problem Relation Age of Onset   Sickle cell anemia Father    Kidney cancer Father    Migraines Sister    Breast cancer Maternal Grandmother    Colon polyps Neg Hx    Colon cancer Neg Hx    Esophageal cancer Neg Hx    Stomach cancer Neg Hx    Rectal cancer Neg Hx     Social History   Socioeconomic History   Marital status: Single    Spouse name: Not on file   Number of children: 1   Years of education: 13   Highest education level: Not on file  Occupational History   Occupation: CNA  Tobacco Use   Smoking status: Former    Current packs/day: 0.50    Average packs/day: 0.5 packs/day for 10.0 years (5.0 ttl pk-yrs)    Types: Cigarettes   Smokeless tobacco: Never  Vaping Use   Vaping status: Former  Substance and Sexual Activity   Alcohol  use: Yes    Comment: occasionally   Drug use: Not Currently    Types: Marijuana   Sexual activity: Yes    Partners: Male    Birth control/protection: Condom  Other Topics Concern   Not on file  Social History Narrative   Fun: Travel, family, son's football games    Denies religious beliefs effecting health care   Feels safe at home and denies abuse.    Right handed   No caffeine   One story home   Social Drivers of Health   Financial Resource Strain: Low Risk  (10/05/2022)   Overall Financial Resource Strain (CARDIA)    Difficulty of Paying Living Expenses: Not hard at all  Food Insecurity:  No Food Insecurity (10/05/2022)   Hunger Vital Sign    Worried About Running Out of Food in the Last Year: Never true    Ran Out of Food in the Last Year: Never true  Transportation Needs: No Transportation Needs (10/05/2022)   PRAPARE - Administrator, Civil Service (Medical): No    Lack of Transportation (Non-Medical): No  Physical Activity: Insufficiently Active (10/05/2022)   Exercise Vital Sign    Days of Exercise per Week: 7 days    Minutes of Exercise per Session: 10 min  Stress: Stress Concern Present (10/05/2022)   [employer] of Occupational Health - Occupational Stress Questionnaire    Feeling of Stress : To some extent  Social Connections: Not on file  Intimate Partner Violence: Not on file    Physical Exam: Vital signs in last 24 hours: @BP  (!) 147/98   Pulse 79   Temp 98 F (36.7 C) (Temporal)   Ht 5\' 8"  (1.727 m)   Wt 208 lb (94.3 kg)   SpO2 99%   BMI 31.63 kg/m  GEN: NAD EYE: Sclerae anicteric ENT: MMM CV: Non-tachycardic Pulm: CTA b/l GI: Soft, NT/ND NEURO:  Alert & Oriented x 3   Harry Lindau, DO [facility]   05/26/2024 11:42 AM

## 2024-05-26 NOTE — Progress Notes (Signed)
 Called to room to assist during endoscopic procedure.  Patient ID and intended procedure confirmed with present staff. Received instructions for my participation in the procedure from the performing physician.

## 2024-05-27 ENCOUNTER — Telehealth: Payer: Self-pay

## 2024-05-27 NOTE — Telephone Encounter (Signed)
 No answer after follow up call. Voice message left.

## 2024-05-28 ENCOUNTER — Ambulatory Visit: Payer: Self-pay | Admitting: Gastroenterology

## 2024-05-28 LAB — SURGICAL PATHOLOGY

## 2024-05-30 ENCOUNTER — Other Ambulatory Visit: Payer: Self-pay

## 2024-06-02 ENCOUNTER — Other Ambulatory Visit: Payer: Self-pay

## 2024-06-05 ENCOUNTER — Other Ambulatory Visit (HOSPITAL_COMMUNITY): Payer: Self-pay

## 2024-06-06 ENCOUNTER — Other Ambulatory Visit

## 2024-06-06 ENCOUNTER — Ambulatory Visit: Admitting: Neurology

## 2024-06-06 DIAGNOSIS — G43709 Chronic migraine without aura, not intractable, without status migrainosus: Secondary | ICD-10-CM | POA: Diagnosis not present

## 2024-06-06 MED ORDER — ONABOTULINUMTOXINA 100 UNITS IJ SOLR
200.0000 [IU] | Freq: Once | INTRAMUSCULAR | Status: AC
  Administered 2024-06-06: 155 [IU] via INTRAMUSCULAR

## 2024-06-06 NOTE — Progress Notes (Signed)

## 2024-06-09 ENCOUNTER — Other Ambulatory Visit: Payer: Self-pay | Admitting: Emergency Medicine

## 2024-06-09 ENCOUNTER — Other Ambulatory Visit: Payer: Self-pay

## 2024-06-09 DIAGNOSIS — E1159 Type 2 diabetes mellitus with other circulatory complications: Secondary | ICD-10-CM

## 2024-06-09 NOTE — Telephone Encounter (Signed)
 Please send to PCP Dr. Debby Molt.  Thanks.

## 2024-06-13 ENCOUNTER — Other Ambulatory Visit (HOSPITAL_COMMUNITY): Payer: Self-pay

## 2024-06-13 ENCOUNTER — Other Ambulatory Visit: Payer: Self-pay | Admitting: Internal Medicine

## 2024-06-13 DIAGNOSIS — E1159 Type 2 diabetes mellitus with other circulatory complications: Secondary | ICD-10-CM

## 2024-06-14 ENCOUNTER — Other Ambulatory Visit: Payer: Self-pay | Admitting: Internal Medicine

## 2024-06-14 DIAGNOSIS — E1129 Type 2 diabetes mellitus with other diabetic kidney complication: Secondary | ICD-10-CM

## 2024-06-14 MED ORDER — TIRZEPATIDE 7.5 MG/0.5ML ~~LOC~~ SOAJ
7.5000 mg | SUBCUTANEOUS | 0 refills | Status: DC
  Filled 2024-06-14: qty 2, 28d supply, fill #0
  Filled 2024-07-09: qty 2, 28d supply, fill #1
  Filled 2024-08-27 – 2024-10-28 (×3): qty 2, 28d supply, fill #2

## 2024-06-16 ENCOUNTER — Other Ambulatory Visit: Payer: Self-pay

## 2024-06-16 ENCOUNTER — Other Ambulatory Visit (HOSPITAL_COMMUNITY): Payer: Self-pay

## 2024-07-08 ENCOUNTER — Other Ambulatory Visit: Payer: Self-pay | Admitting: Internal Medicine

## 2024-07-08 DIAGNOSIS — Z1231 Encounter for screening mammogram for malignant neoplasm of breast: Secondary | ICD-10-CM

## 2024-07-09 ENCOUNTER — Other Ambulatory Visit (HOSPITAL_COMMUNITY): Payer: Self-pay

## 2024-07-30 NOTE — Progress Notes (Signed)
 NEUROLOGY FOLLOW UP OFFICE NOTE  Rita Harrison [id number]  Assessment/Plan:   1  Migraine without aura, without status migrainosus, not intractable  2  Cervicalgia 3  Hypertension   Migraine prevention:  Botox  Migraine rescue:  She will try samples of Zavzpret if needed. Limit use of pain relievers to no more than 9 days out of the month to prevent risk of rebound or medication-overuse headache. Keep headache diary Follow up with PCP regarding BP  Follow up one year for routine visit.  Subjective:  Rita Harrison is a 43 year old right-handed female with HTN, sickle cell trait and history of right upper extremity DVT who follows up for migraines.   UPDATE: Continues on Botox . Doing well Intensity:  mild-moderate Duration:  2-3 hours.  Does not need to take anything because not severe.  Has samples of Zavzpret but hasn't needed to use it yet. Frequency:  2 month.  Usually triggered by stress.   Current NSAIDS/analgesics:  Tylenol , Voltaren gel, ibuprofen  Current triptans:  none Current ergotamine:  none Current anti-emetic:  ondansetron  ODT 4mg  Current muscle relaxants:  none Current Antihypertensive medications:  nebivolol , amlodipine  Current Antidepressant medications:  none Current Anticonvulsant medications:  none Current anti-CGRP:  none Current Vitamins/Herbal/Supplements:  B12 Current Antihistamines/Decongestants:  none Other therapy:  Botox  Birth control:  none Other medications:  Alprazolam  (anxiety)     Caffeine:  no coffee, soda Diet:  Drinks a lot of water.  Stopped soda.  Increased fruits and vegetables intake.   Exercise:  goes to gym most days Depression:  some; Anxiety:  yes.  Her son's father and her grandmother passed away last year.  Her friend passed away 2 years ago.   Other pain:  no Sleep hygiene:  good until she started having headaches.  Headaches may wake her up.  Also has hot flashes at night   HISTORY:  Onset:  August 02, 2022 Location:  right frontal, radiating from back of neck Quality:  pressure Intensity:  mild.  Gradual onset Aura:  absent Prodrome:  absent Associated symptoms:  Nausea, photophobia, phonophobia.  She denies associated vomiting, visual disturbance, autonomic symptoms or unilateral numbness or weakness. Duration:  Treats it immediately with ibuprofen  or Tylenol  Extra-strength and will last 15-30 minutes Frequency:  3 times daily (8:30 AM, around noon, 7 PM) daily.  Sometimes may wake her up in middle of night for which she takes another Tylenol  or ibuprofen  Frequency of abortive medication: 3 to 4 times daily Triggers:  unknown Relieving factors:  Tylenol  and ibuprofen  Activity:  not aggravating Reports similar headaches sometimes associated with her cycle, but infrequent. She saw her gynecologist.  Blood work showed she is almost perimenopausal.  Reports hot flashes at night.  Blood pressure has been elevated but unsure if cause or effect of headache.   MRI of brain without contrast on 10/23/2022 revealed minimal scattered T2 FLAIR hyperintense foci within the cerebral white matter as well as prominent posterior pituitary bright spot vs pars intermedia cyst but nothing concerning that would explain headaches.    She continued having bilateral neck pain but also numbness in the hands. She saw [facility] as well as [facility].  MRI of cervical spine on 01/12/2023 personally reviewed revealed only small disc protrusions at C4-5 and C5-6 contacting the ventral cord but no stenosis or impingement.  She received bilateral occipital nerve block on 3/25 which was ineffective.  Started on gabapentin  and Volteran gel.  Gabapentin  has been ineffective.  In physical therapy, undergoing dry needling.  No significant improvement thus far.     Past NSAIDS/analgesics:  ketorolac , meloxicam , BC powder Past abortive triptans:  rizatriptan , sumatriptan  tab, naratriptan  (all cause  nausea) Past abortive ergotamine:  none Past muscle relaxants:  methocarbamol , cylcobenzaprine Past anti-emetic:  ondansetron  Past antihypertensive medications:  lisinopril , triamterene -HCTZ Past antidepressant medications:  none Past anticonvulsant medications:  topiramate  (paresthesias, fatigue), gabapentin  900mg  at bedtime (occipital neuralgia/cervicalgia) Past anti-CGRP:  Nurtec (made migraine worse), Ubrelvy  (made migraines worse) Past vitamins/Herbal/Supplements:  none Past antihistamines/decongestants:  diphenhydramine  Other past therapies:  occipital nerve block, physical therapy cervicalgia         Family history of headache:  Sister (migraines).  No family history of cerebral aneurysms.   PAST MEDICAL HISTORY: Past Medical History:  Diagnosis Date   Anxiety    Arm DVT (deep venous thromboembolism), chronic (HCC)    RUE   Chicken pox    Diabetes mellitus (HCC) 04/18/2024   Encounter for general adult medical examination with abnormal findings 11/09/2020   Hypertension    Sickle cell anemia (HCC)    sickle cell trait    MEDICATIONS: Current Outpatient Medications on File Prior to Visit  Medication Sig Dispense Refill   ALPRAZolam  (XANAX ) 0.25 MG tablet Take 1 tablet (0.25 mg total) by mouth 2 (two) times daily as needed for anxiety. 20 tablet 2   Aluminum  Chloride in Alcohol  6.25 % SOLN Apply 1 actuation topically at bedtime as directed 60 mL 3   amLODipine  (NORVASC ) 5 MG tablet Take 1 tablet (5 mg total) by mouth daily. 90 tablet 1   Biotin 3 MG TABS Take by mouth.     botulinum toxin Type A  (BOTOX ) 200 units injection Inject 200 Units into the muscle every 3 (three) months. Inject 155 units IM into multiple site in the face,neck and head once every 90 days 1 each 4   clindamycin  (CLEOCIN  T) 1 % lotion APPLY A THIN LAYER TO THE AFFECTED AREA(S) 2 TIMES PER DAY AS NEEDED 60 mL 1   Multiple Vitamin (MULTIVITAMIN ADULT PO) Take by mouth.     naratriptan  (AMERGE) 2.5 MG  tablet Take 1 tablet (2.5 mg total) by mouth as needed for migraine. Take one (1) tablet at onset of headache; if returns or does not resolve, may repeat after 4 hours; do not exceed 2 tablets in 24 hours. 10 tablet 5   nebivolol  (BYSTOLIC ) 10 MG tablet Take 1 tablet (10 mg total) by mouth daily. 90 tablet 1   potassium chloride  (KLOR-CON  10) 10 MEQ tablet Take 1 tablet (10 mEq total) by mouth 3 (three) times daily. 270 tablet 1   rosuvastatin  (CRESTOR ) 10 MG tablet Take 1 tablet (10 mg total) by mouth daily. 90 tablet 1   tirzepatide  (MOUNJARO ) 7.5 MG/0.5ML Pen Inject 7.5 mg into the skin once a week. 6 mL 0   torsemide  (DEMADEX ) 20 MG tablet Take 1 tablet (20 mg total) by mouth daily. 90 tablet 1   vitamin B-12 (CYANOCOBALAMIN) 100 MCG tablet Take 100 mcg by mouth daily.     Fluocinolone  Acetonide Body (DERMA-SMOOTHE /FS BODY) 0.01 % OIL Apply topically at bedtime as directed. (Patient not taking: Reported on 07/31/2024) 118.28 mL 1   hydrocortisone  2.5 % cream Apply 1 Application topically 2 (two) times daily 1-2 weeks on/off as needed for flare (Patient not taking: Reported on 07/31/2024) 30 g 2   omeprazole  (PRILOSEC) 20 MG capsule Take 1 capsule (20 mg total)  by mouth daily. (Patient not taking: Reported on 07/31/2024) 30 capsule 1   No current facility-administered medications on file prior to visit.     ALLERGIES: Allergies  Allergen Reactions   Lisinopril Shortness Of Breath and Itching    angioedema   Indapamide Rash   Spironolactone Other (See Comments)    itching    FAMILY HISTORY: Family History  Problem Relation Age of Onset   Sickle cell anemia Father    Kidney cancer Father    Migraines Sister    Breast cancer Maternal Grandmother    Colon polyps Neg Hx    Colon cancer Neg Hx    Esophageal cancer Neg Hx    Stomach cancer Neg Hx    Rectal cancer Neg Hx       Objective:  Blood pressure (!) 129/90, height 5' 6.5 (1.689 m), weight 199 lb (90.3 kg). General: No acute  distress.  Patient appears well-groomed.   Head:  Normocephalic/atraumatic Neck:  Supple.  No paraspinal tenderness.  Full range of motion. Heart:  Regular rate and rhythm. Neuro:  Alert and oriented.  Speech fluent and not dysarthric.  Language intact.  CN II-XII intact.  Bulk and tone normal.  Muscle strength 5/5 throughout.  Sensation to light touch intact.  Deep tendon reflexes 2+ throughout, toes downgoing.  Gait normal.  Romberg negative.    Juliene Dunnings, DO  CC: Debby Molt, MD

## 2024-07-31 ENCOUNTER — Encounter: Payer: Self-pay | Admitting: Neurology

## 2024-07-31 ENCOUNTER — Ambulatory Visit (INDEPENDENT_AMBULATORY_CARE_PROVIDER_SITE_OTHER): Admitting: Neurology

## 2024-07-31 VITALS — BP 129/90 | Ht 66.5 in | Wt 199.0 lb

## 2024-07-31 DIAGNOSIS — G43009 Migraine without aura, not intractable, without status migrainosus: Secondary | ICD-10-CM | POA: Diagnosis not present

## 2024-07-31 NOTE — Patient Instructions (Signed)
 Botox  At earliest onset of migraine, take Zavzpret nasal spray as directed (once daily as needed).  Let me know if it works

## 2024-08-01 ENCOUNTER — Encounter: Payer: Self-pay | Admitting: Neurology

## 2024-08-06 ENCOUNTER — Ambulatory Visit
Admission: RE | Admit: 2024-08-06 | Discharge: 2024-08-06 | Disposition: A | Discharge status: Home / Self Care | Source: Ambulatory Visit | Attending: Internal Medicine | Admitting: Internal Medicine

## 2024-08-06 DIAGNOSIS — Z1231 Encounter for screening mammogram for malignant neoplasm of breast: Secondary | ICD-10-CM

## 2024-08-19 ENCOUNTER — Ambulatory Visit: Admitting: Neurology

## 2024-08-19 ENCOUNTER — Other Ambulatory Visit: Payer: Self-pay

## 2024-08-26 ENCOUNTER — Other Ambulatory Visit: Payer: Self-pay

## 2024-08-26 ENCOUNTER — Other Ambulatory Visit: Payer: Self-pay | Admitting: Pharmacy Technician

## 2024-08-26 NOTE — Progress Notes (Signed)
 Specialty Pharmacy Refill Coordination Note  Rita Harrison is a 43 y.o. female assessed today regarding refills of clinic administered specialty medication(s) OnabotulinumtoxinA  (Botox )   Clinic requested Courier to Provider Office   Delivery date: 09/01/24   Verified address: [facility] [address]   Medication will be filled on 08/29/24.  Injection Appointment 09/05/24.  Copay $0

## 2024-08-27 ENCOUNTER — Other Ambulatory Visit (HOSPITAL_COMMUNITY): Payer: Self-pay

## 2024-08-27 ENCOUNTER — Other Ambulatory Visit: Payer: Self-pay

## 2024-08-28 ENCOUNTER — Other Ambulatory Visit: Payer: Self-pay

## 2024-09-01 ENCOUNTER — Telehealth: Payer: Self-pay | Admitting: Internal Medicine

## 2024-09-01 NOTE — Telephone Encounter (Signed)
 Noted. I have 4 copies of this paperwork.

## 2024-09-01 NOTE — Telephone Encounter (Signed)
 FMLA paperwork has been brought in-- pt has an appt on the 19th of this month. Paperwork placed in providers box. Please advise, Thanks

## 2024-09-03 ENCOUNTER — Encounter: Payer: Self-pay | Admitting: Internal Medicine

## 2024-09-03 ENCOUNTER — Other Ambulatory Visit (HOSPITAL_COMMUNITY): Payer: Self-pay

## 2024-09-03 ENCOUNTER — Ambulatory Visit: Admitting: Internal Medicine

## 2024-09-03 VITALS — BP 132/84 | HR 72 | Temp 98.5°F | Resp 16 | Ht 66.5 in | Wt 198.4 lb

## 2024-09-03 DIAGNOSIS — I10 Essential (primary) hypertension: Secondary | ICD-10-CM

## 2024-09-03 DIAGNOSIS — E876 Hypokalemia: Secondary | ICD-10-CM

## 2024-09-03 DIAGNOSIS — E1129 Type 2 diabetes mellitus with other diabetic kidney complication: Secondary | ICD-10-CM

## 2024-09-03 DIAGNOSIS — K219 Gastro-esophageal reflux disease without esophagitis: Secondary | ICD-10-CM | POA: Diagnosis not present

## 2024-09-03 DIAGNOSIS — T502X5A Adverse effect of carbonic-anhydrase inhibitors, benzothiadiazides and other diuretics, initial encounter: Secondary | ICD-10-CM

## 2024-09-03 DIAGNOSIS — Z0000 Encounter for general adult medical examination without abnormal findings: Secondary | ICD-10-CM

## 2024-09-03 DIAGNOSIS — Z0001 Encounter for general adult medical examination with abnormal findings: Secondary | ICD-10-CM

## 2024-09-03 LAB — CBC WITH DIFFERENTIAL/PLATELET
Basophils Absolute: 0.1 K/uL (ref 0.0–0.1)
Basophils Relative: 1 % (ref 0.0–3.0)
Eosinophils Absolute: 0.1 K/uL (ref 0.0–0.7)
Eosinophils Relative: 1.4 % (ref 0.0–5.0)
HCT: 40.2 % (ref 36.0–46.0)
Hemoglobin: 13.5 g/dL (ref 12.0–15.0)
Lymphocytes Relative: 27.5 % (ref 12.0–46.0)
Lymphs Abs: 2.6 K/uL (ref 0.7–4.0)
MCHC: 33.7 g/dL (ref 30.0–36.0)
MCV: 80.7 fl (ref 78.0–100.0)
Monocytes Absolute: 0.6 K/uL (ref 0.1–1.0)
Monocytes Relative: 6.5 % (ref 3.0–12.0)
Neutro Abs: 6 K/uL (ref 1.4–7.7)
Neutrophils Relative %: 63.6 % (ref 43.0–77.0)
Platelets: 266 K/uL (ref 150.0–400.0)
RBC: 4.98 Mil/uL (ref 3.87–5.11)
RDW: 15.1 % (ref 11.5–15.5)
WBC: 9.4 K/uL (ref 4.0–10.5)

## 2024-09-03 LAB — BASIC METABOLIC PANEL WITH GFR
BUN: 17 mg/dL (ref 6–23)
CO2: 28 meq/L (ref 19–32)
Calcium: 9.5 mg/dL (ref 8.4–10.5)
Chloride: 104 meq/L (ref 96–112)
Creatinine, Ser: 1.09 mg/dL (ref 0.40–1.20)
GFR: 61.36 mL/min (ref >60.00)
Glucose, Bld: 60 mg/dL — ABNORMAL LOW (ref 70–99)
Potassium: 3.2 meq/L — ABNORMAL LOW (ref 3.5–5.1)
Sodium: 145 meq/L (ref 135–145)

## 2024-09-03 LAB — HEMOGLOBIN A1C: Hgb A1c MFr Bld: 6.3 % (ref 4.6–6.5)

## 2024-09-03 MED ORDER — POTASSIUM CHLORIDE CRYS ER 15 MEQ PO TBCR
15.0000 meq | EXTENDED_RELEASE_TABLET | Freq: Two times a day (BID) | ORAL | 1 refills | Status: AC
  Filled 2024-09-03: qty 180, 90d supply, fill #0
  Filled 2024-10-06: qty 60, 30d supply, fill #0

## 2024-09-03 NOTE — Progress Notes (Unsigned)
 Subjective:  Patient ID: Rita Harrison, female    DOB: 1981/03/07  Age: 43 y.o. MRN: [id number]  CC: FMLA Paperwork  (Flank pain that she's been having. She states that she has spams where it takes her breath. She is requesting for light duty at work. ), Annual Exam, Hypertension, and Hyperlipidemia   HPI Rita Harrison presents for f/up ---  Discussed the use of AI scribe software for clinical note transcription with the patient, who gave verbal consent to proceed.  History of Present Illness Rita Harrison is a 43 year old female who presents for FMLA paperwork renewal due to chronic flank pain.  She experiences chronic flank pain that has persisted for several years. The pain had subsided during a year-long break from work but has returned with increased frequency since resuming her job. She describes the pain as spasms occurring two to three times a week, severe enough to require her to stop working and catch her breath. The pain radiates up the side of her back.  She is currently on FMLA due to the impact of the pain on her ability to work. 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  She takes Torsemide , which causes frequent urination, adding to her work-related stress as she is unable to sit down during her shifts. No chest pain, shortness of breath, dizziness, or lightheadedness.  She had a headache yesterday and a slight one this morning, which she attributes to stress and recent bereavement of a cousin. She receives Botox  injections every 90 days for migraines and needs to report on their effectiveness.     Outpatient Medications Prior to Visit  Medication Sig Dispense Refill   ALPRAZolam  (XANAX ) 0.25 MG tablet Take 1 tablet (0.25 mg total) by mouth 2 (two) times daily as needed for anxiety. 20 tablet 2   Aluminum  Chloride in Alcohol  6.25 % SOLN  Apply 1 actuation topically at bedtime as directed 60 mL 3   Biotin 3 MG TABS Take by mouth.     botulinum toxin Type A  (BOTOX ) 200 units injection Inject 200 Units into the muscle every 3 (three) months. Inject 155 units IM into multiple site in the face,neck and head once every 90 days 1 each 4   clindamycin  (CLEOCIN  T) 1 % lotion APPLY A THIN LAYER TO THE AFFECTED AREA(S) 2 TIMES PER DAY AS NEEDED 60 mL 1   Fluocinolone  Acetonide Body (DERMA-SMOOTHE /FS BODY) 0.01 % OIL Apply topically at bedtime as directed. 118.28 mL 1   hydrocortisone  2.5 % cream Apply 1 Application topically 2 (two) times daily 1-2 weeks on/off as needed for flare 30 g 2   Multiple Vitamin (MULTIVITAMIN ADULT PO) Take by mouth.     naratriptan  (AMERGE) 2.5 MG tablet Take 1 tablet (2.5 mg total) by mouth as needed for migraine. Take one (1) tablet at onset of headache; if returns or does not resolve, may repeat after 4 hours; do not exceed 2 tablets in 24 hours. 10 tablet 5   nebivolol  (BYSTOLIC ) 10 MG tablet Take 1 tablet (10 mg total) by mouth daily. 90 tablet 1   omeprazole  (PRILOSEC) 20 MG capsule Take 1 capsule (20 mg total) by mouth daily. 30 capsule 1   tirzepatide  (MOUNJARO ) 7.5 MG/0.5ML Pen Inject 7.5 mg into the skin once a week. 6 mL 0   torsemide  (DEMADEX ) 20 MG tablet  Take 1 tablet (20 mg total) by mouth daily. 90 tablet 1   vitamin B-12 (CYANOCOBALAMIN) 100 MCG tablet Take 100 mcg by mouth daily.     potassium chloride  (KLOR-CON  10) 10 MEQ tablet Take 1 tablet (10 mEq total) by mouth 3 (three) times daily. 270 tablet 1   amLODipine  (NORVASC ) 5 MG tablet Take 1 tablet (5 mg total) by mouth daily. 90 tablet 1   rosuvastatin  (CRESTOR ) 10 MG tablet Take 1 tablet (10 mg total) by mouth daily. 90 tablet 1   No facility-administered medications prior to visit.    ROS Review of Systems  Constitutional:  Negative for appetite change, chills, diaphoresis, fatigue and fever.  HENT: Negative.  Negative for trouble  swallowing.   Eyes: Negative.   Respiratory:  Negative for cough, chest tightness, shortness of breath and wheezing.   Cardiovascular:  Negative for chest pain, palpitations and leg swelling.  Gastrointestinal:  Positive for abdominal pain. Negative for abdominal distention, constipation, diarrhea, nausea and vomiting.  Endocrine: Positive for polyuria. Negative for polydipsia and polyphagia.  Genitourinary:  Positive for flank pain and frequency. Negative for dysuria and hematuria.  Musculoskeletal:  Negative for arthralgias, back pain, joint swelling and myalgias.  Skin: Negative.   Neurological:  Negative for dizziness, weakness and light-headedness.  Hematological:  Negative for adenopathy. Does not bruise/bleed easily.  Psychiatric/Behavioral: Negative.      Objective:  BP 132/84 (BP Location: Left Arm, Patient Position: Sitting, Cuff Size: Normal)   Pulse 72   Temp 98.5 F (36.9 C) (Oral)   Resp 16   Ht 5' 6.5 (1.689 m)   Wt 198 lb 6.4 oz (90 kg)   LMP 03/09/2024 (Approximate)   SpO2 98%   BMI 31.54 kg/m   BP Readings from Last 3 Encounters:  09/03/24 132/84  07/31/24 (!) 129/90  05/26/24 126/81    Wt Readings from Last 3 Encounters:  09/03/24 198 lb 6.4 oz (90 kg)  07/31/24 199 lb (90.3 kg)  05/26/24 208 lb (94.3 kg)    Physical Exam Vitals reviewed.  Constitutional:      Appearance: Normal appearance.  HENT:     Nose: Nose normal.     Mouth/Throat:     Mouth: Mucous membranes are moist.  Eyes:     General: No scleral icterus.    Conjunctiva/sclera: Conjunctivae normal.  Cardiovascular:     Rate and Rhythm: Normal rate and regular rhythm.     Pulses: Normal pulses.     Heart sounds: No murmur heard.    No friction rub. No gallop.  Pulmonary:     Effort: Pulmonary effort is normal.     Breath sounds: No stridor. No wheezing, rhonchi or rales.  Abdominal:     General: Abdomen is protuberant. Bowel sounds are normal. There is no distension.      Palpations: There is no hepatomegaly, splenomegaly or mass.     Tenderness: There is no abdominal tenderness. There is no right CVA tenderness, left CVA tenderness, guarding or rebound.  Musculoskeletal:        General: Normal range of motion.     Cervical back: Neck supple.     Right lower leg: No edema.     Left lower leg: No edema.  Lymphadenopathy:     Cervical: No cervical adenopathy.  Skin:    General: Skin is warm and dry.  Neurological:     General: No focal deficit present.     Mental Status: She is alert. Mental status  is at baseline.  Psychiatric:        Mood and Affect: Mood normal.        Behavior: Behavior normal.     Lab Results  Component Value Date   WBC 9.4 09/03/2024   HGB 13.5 09/03/2024   HCT 40.2 09/03/2024   PLT 266.0 09/03/2024   GLUCOSE 60 (L) 09/03/2024   CHOL 176 04/17/2024   TRIG 216.0 (H) 04/17/2024   HDL 34.60 (L) 04/17/2024   LDLCALC 98 04/17/2024   ALT 42 (H) 03/25/2024   AST 25 03/25/2024   NA 145 09/03/2024   K 3.2 (L) 09/03/2024   CL 104 09/03/2024   CREATININE 1.09 09/03/2024   BUN 17 09/03/2024   CO2 28 09/03/2024   TSH 0.79 04/17/2024   INR 1.08 04/23/2013   HGBA1C 6.3 09/03/2024   MICROALBUR 0.8 09/03/2024    MM 3D SCREENING MAMMOGRAM BILATERAL BREAST Result Date: 08/08/2024 CLINICAL DATA:  Screening. EXAM: DIGITAL SCREENING BILATERAL MAMMOGRAM WITH TOMOSYNTHESIS AND CAD TECHNIQUE: Bilateral screening digital craniocaudal and mediolateral oblique mammograms were obtained. Bilateral screening digital breast tomosynthesis was performed. The images were evaluated with computer-aided detection. COMPARISON:  Previous exam(s). ACR Breast Density Category b: There are scattered areas of fibroglandular density. FINDINGS: There are no findings suspicious for malignancy. IMPRESSION: No mammographic evidence of malignancy. A result letter of this screening mammogram will be mailed directly to the patient. RECOMMENDATION: Screening mammogram in  one year. (Code:SM-B-01Y) BI-RADS CATEGORY  1: Negative. Electronically Signed   By: Inocente Ast M.D.   On: 08/08/2024 14:40    Fibrosis 4 Score = .65 (Low risk)        Interpretation for patients with NAFLD          <1.30       -  F0-F1 (Low risk)          1.30-2.67 -  Indeterminate           >2.67      -  F3-F4 (High risk)     Validated for ages 46-65         Assessment & Plan:   Type 2 diabetes mellitus with other diabetic kidney complication, without long-term current use of insulin (HCC)- Blood sugar is well controlled. -     Hepatitis B surface antibody,quantitative; Future -     Hepatitis A antibody, total; Future -     Basic metabolic panel with GFR; Future -     Hemoglobin A1c; Future -     Microalbumin / creatinine urine ratio; Future -     Urinalysis, Routine w reflex microscopic; Future -     HM DIABETES FOOT EXAM -     Ambulatory referral to Ophthalmology  Primary hypertension- BP is well controlled. -     CBC with Differential/Platelet; Future -     Basic metabolic panel with GFR; Future -     Urinalysis, Routine w reflex microscopic; Future -     Potassium Chloride  Crys ER; Take 1 tablet (15 mEq total) by mouth 2 (two) times daily.  Dispense: 180 tablet; Refill: 1  Gastroesophageal reflux disease, unspecified whether esophagitis present -     CBC with Differential/Platelet; Future  Encounter for general adult medical examination with abnormal findings- Exam completed, labs reviewed, vaccines reviewed, cancer screenings are UTD, pt ed material was given.   Diuretic-induced hypokalemia -     Potassium Chloride  Crys ER; Take 1 tablet (15 mEq total) by mouth 2 (two) times daily.  Dispense:  180 tablet; Refill: 1     Follow-up: Return in about 6 months (around 03/03/2025).  Debby Molt, MD

## 2024-09-03 NOTE — Patient Instructions (Signed)

## 2024-09-04 ENCOUNTER — Ambulatory Visit: Payer: Self-pay | Admitting: Internal Medicine

## 2024-09-04 ENCOUNTER — Other Ambulatory Visit: Payer: Self-pay

## 2024-09-04 LAB — URINALYSIS, ROUTINE W REFLEX MICROSCOPIC
Bilirubin Urine: NEGATIVE
Ketones, ur: NEGATIVE
Leukocytes,Ua: NEGATIVE
Nitrite: NEGATIVE
Specific Gravity, Urine: 1.02 (ref 1.000–1.030)
Total Protein, Urine: NEGATIVE
Urine Glucose: NEGATIVE
Urobilinogen, UA: 0.2 (ref 0.0–1.0)
pH: 6 (ref 5.0–8.0)

## 2024-09-04 LAB — MICROALBUMIN / CREATININE URINE RATIO
Creatinine,U: 77.6 mg/dL
Microalb Creat Ratio: 9.8 mg/g (ref 0.0–30.0)
Microalb, Ur: 0.8 mg/dL (ref 0.0–1.9)

## 2024-09-04 LAB — HEPATITIS B SURFACE ANTIBODY, QUANTITATIVE: Hep B S AB Quant (Post): 120 m[IU]/mL (ref >=10)

## 2024-09-04 LAB — HEPATITIS A ANTIBODY, TOTAL: Hepatitis A AB,Total: REACTIVE — AB

## 2024-09-04 NOTE — Telephone Encounter (Signed)
 Patient has been seen. Paperwork being completed and faxed.

## 2024-09-04 NOTE — Telephone Encounter (Signed)
 Copied from CRM #[id number]. Topic: General - Other >> Sep 01, 2024  9:04 AM Donna BRAVO wrote: Reason for CRM: patient will be dropping off FMLA paper work around 1:00pm  so it can be filled out and faxed back to HR on appt day 09/03/24 >> Sep 04, 2024 12:49 PM Drema MATSU wrote: Patient wants to know if she can pick FMLA paperwork up by the end of the day. She states that she has to have it on tomorrow.

## 2024-09-05 ENCOUNTER — Ambulatory Visit (INDEPENDENT_AMBULATORY_CARE_PROVIDER_SITE_OTHER): Admitting: Neurology

## 2024-09-05 ENCOUNTER — Other Ambulatory Visit (HOSPITAL_COMMUNITY): Payer: Self-pay

## 2024-09-05 DIAGNOSIS — G43709 Chronic migraine without aura, not intractable, without status migrainosus: Secondary | ICD-10-CM | POA: Diagnosis not present

## 2024-09-05 MED ORDER — ONABOTULINUMTOXINA 100 UNITS IJ SOLR
200.0000 [IU] | Freq: Once | INTRAMUSCULAR | Status: AC
  Administered 2024-09-05: 155 [IU] via INTRAMUSCULAR

## 2024-09-05 NOTE — Telephone Encounter (Signed)
 Paperwork has been completed patient has been made aware. Copy has been placed upfront for pickup.

## 2024-09-05 NOTE — Progress Notes (Signed)

## 2024-09-08 ENCOUNTER — Telehealth: Payer: Self-pay | Admitting: Radiology

## 2024-09-08 NOTE — Telephone Encounter (Signed)
 Copied from CRM #[id number]. Topic: General - Other >> Sep 08, 2024  4:10 PM Burnard DEL wrote: Reason for CRM: Patient called in to speak with person that takes care of FMLA paperwork. She said that she picked up paperwork on 09/05/2024,and something on it wasn't completed correctly so her employer notified her through email.She would like a phone call from the person that completed this for her to get the corrections corrected.

## 2024-09-10 ENCOUNTER — Other Ambulatory Visit (HOSPITAL_COMMUNITY): Payer: Self-pay

## 2024-09-10 NOTE — Telephone Encounter (Unsigned)
 Copied from CRM #[id number]. Topic: General - Other >> Sep 10, 2024  8:36 AM Macario HERO wrote: Reason for CRM: Patient called to speak with [facility] regarding FMLA paperwork. Advised should receive a call before the end of the day.

## 2024-09-12 NOTE — Telephone Encounter (Signed)
 Unable to reach patient [email or web address]

## 2024-09-12 NOTE — Telephone Encounter (Signed)
 Advised patient that I do have her paperwork and will complete it Monday! She gave a verbal understanding.

## 2024-09-15 ENCOUNTER — Other Ambulatory Visit (HOSPITAL_COMMUNITY): Payer: Self-pay

## 2024-09-18 ENCOUNTER — Emergency Department (HOSPITAL_BASED_OUTPATIENT_CLINIC_OR_DEPARTMENT_OTHER)
Admission: EM | Admit: 2024-09-18 | Discharge: 2024-09-18 | Disposition: A | Discharge status: Home / Self Care | Attending: Emergency Medicine | Admitting: Emergency Medicine

## 2024-09-18 ENCOUNTER — Encounter (HOSPITAL_BASED_OUTPATIENT_CLINIC_OR_DEPARTMENT_OTHER): Payer: Self-pay

## 2024-09-18 ENCOUNTER — Other Ambulatory Visit: Payer: Self-pay

## 2024-09-18 DIAGNOSIS — J029 Acute pharyngitis, unspecified: Secondary | ICD-10-CM | POA: Diagnosis not present

## 2024-09-18 DIAGNOSIS — E119 Type 2 diabetes mellitus without complications: Secondary | ICD-10-CM | POA: Insufficient documentation

## 2024-09-18 DIAGNOSIS — I10 Essential (primary) hypertension: Secondary | ICD-10-CM | POA: Diagnosis not present

## 2024-09-18 LAB — RESP PANEL BY RT-PCR (RSV, FLU A&B, COVID)  RVPGX2
Influenza A by PCR: NEGATIVE
Influenza B by PCR: NEGATIVE
Resp Syncytial Virus by PCR: NEGATIVE
SARS Coronavirus 2 by RT PCR: NEGATIVE

## 2024-09-18 LAB — GROUP A STREP BY PCR: Group A Strep by PCR: NOT DETECTED

## 2024-09-18 MED ORDER — IBUPROFEN 400 MG PO TABS
600.0000 mg | ORAL_TABLET | Freq: Once | ORAL | Status: AC
  Administered 2024-09-18: 600 mg via ORAL
  Filled 2024-09-18: qty 1

## 2024-09-18 MED ORDER — DEXAMETHASONE 4 MG PO TABS: 8.0000 mg | ORAL_TABLET | Freq: Once | ORAL | Status: DC

## 2024-09-18 NOTE — Discharge Instructions (Signed)
 Is a pleasure take care of you today.  You are seen in the emergency room for sore throat.  You are negative for COVID, influenza and RSV as well as strep throat.  Your symptoms likely related to a virus, drink plenty of fluids, rest and take over-the-counter Tylenol  ibuprofen  as directed on packaging.  Come back to the ER if you have new or worsening symptoms.

## 2024-09-18 NOTE — ED Triage Notes (Signed)
 Pt reports sore throat and body aches x1 day. Pt denies any recent sick contacts.

## 2024-09-18 NOTE — ED Notes (Signed)

## 2024-09-18 NOTE — ED Provider Notes (Signed)
 [place] EMERGENCY DEPARTMENT AT [facility] Provider Note   CSN: [id number] Arrival date & time: 09/18/24  1508     Patient presents with: Sore Throat   Rita Harrison is a 43 y.o. female.  She has history of controlled type 2 diabetes, anxiety, hyperlipidemia, hypertension.  She presents the ER today complaining of sore throat and bodyaches that started this morning when she woke up, denies any known fever, she has not take any medication.  Denies any sick contacts, no trouble swallowing or breathing.    Sore Throat Pertinent negatives include no shortness of breath.       Prior to Admission medications   Medication Sig Start Date End Date Taking? Authorizing Provider  ALPRAZolam  (XANAX ) 0.25 MG tablet Take 1 tablet (0.25 mg total) by mouth 2 (two) times daily as needed for anxiety. 02/29/24   Joshua Debby CROME, MD  Aluminum  Chloride in Alcohol  6.25 % SOLN Apply 1 actuation topically at bedtime as directed 03/06/22   Joshua Debby CROME, MD  Biotin 3 MG TABS Take by mouth.    [provider]  botulinum toxin Type A  (BOTOX ) 200 units injection Inject 200 Units into the muscle every 3 (three) months. Inject 155 units IM into multiple site in the face,neck and head once every 90 days 05/13/24   Skeet Juliene SAUNDERS, DO  clindamycin  (CLEOCIN  T) 1 % lotion APPLY A THIN LAYER TO THE AFFECTED AREA(S) 2 TIMES PER DAY AS NEEDED 07/14/22     Fluocinolone  Acetonide Body (DERMA-SMOOTHE /FS BODY) 0.01 % OIL Apply topically at bedtime as directed. 11/28/23   Joshua Debby CROME, MD  hydrocortisone  2.5 % cream Apply 1 Application topically 2 (two) times daily 1-2 weeks on/off as needed for flare 07/31/23     Multiple Vitamin (MULTIVITAMIN ADULT PO) Take by mouth.    [provider]  naratriptan  (AMERGE) 2.5 MG tablet Take 1 tablet (2.5 mg total) by mouth as needed for migraine. Take one (1) tablet at onset of headache; if returns or does not resolve, may repeat after 4 hours; do not exceed  2 tablets in 24 hours. 11/23/23   Skeet Juliene SAUNDERS, DO  nebivolol  (BYSTOLIC ) 10 MG tablet Take 1 tablet (10 mg total) by mouth daily. 04/17/24   Joshua Debby CROME, MD  omeprazole  (PRILOSEC) 20 MG capsule Take 1 capsule (20 mg total) by mouth daily. 08/13/23   Joshua Debby CROME, MD  potassium chloride  SA (KLOR-CON  M15) 15 MEQ tablet Take 1 tablet (15 mEq total) by mouth 2 (two) times daily. 09/03/24   Joshua Debby CROME, MD  tirzepatide  (MOUNJARO ) 7.5 MG/0.5ML Pen Inject 7.5 mg into the skin once a week. 06/14/24   Joshua Debby CROME, MD  torsemide  (DEMADEX ) 20 MG tablet Take 1 tablet (20 mg total) by mouth daily. 04/17/24   Joshua Debby CROME, MD  vitamin B-12 (CYANOCOBALAMIN) 100 MCG tablet Take 100 mcg by mouth daily.    [provider]    Allergies: Lisinopril , Indapamide , and Spironolactone     Review of Systems  Constitutional:  Positive for chills.  HENT:  Positive for sore throat. Negative for congestion, ear pain, rhinorrhea, sinus pain and sneezing.   Respiratory:  Negative for cough and shortness of breath.   Musculoskeletal:  Positive for myalgias.    Updated Vital Signs BP (!) 148/102   Pulse 100   Temp 99 F (37.2 C) (Oral)   Resp 16   Ht 5' 6 (1.676 m)   Wt 89.8 kg  LMP 03/09/2024 (Approximate)   SpO2 98%   BMI 31.96 kg/m   Physical Exam Vitals and nursing note reviewed.  Constitutional:      General: She is not in acute distress.    Appearance: She is well-developed.  HENT:     Head: Normocephalic and atraumatic.     Mouth/Throat:     Mouth: Mucous membranes are moist.     Pharynx: Uvula midline. Posterior oropharyngeal erythema present. No pharyngeal swelling, oropharyngeal exudate or uvula swelling.     Tonsils: No tonsillar exudate or tonsillar abscesses. 1+ on the right. 1+ on the left.  Eyes:     Conjunctiva/sclera: Conjunctivae normal.     Pupils: Pupils are equal, round, and reactive to light.  Cardiovascular:     Rate and Rhythm: Normal rate and regular rhythm.      Heart sounds: No murmur heard. Pulmonary:     Effort: Pulmonary effort is normal. No respiratory distress.     Breath sounds: Normal breath sounds.  Abdominal:     Palpations: Abdomen is soft.     Tenderness: There is no abdominal tenderness.  Musculoskeletal:        General: No swelling.     Cervical back: Neck supple.  Skin:    General: Skin is warm and dry.     Capillary Refill: Capillary refill takes less than 2 seconds.  Neurological:     Mental Status: She is alert and oriented to person, place, and time.  Psychiatric:        Mood and Affect: Mood normal.     (all labs ordered are listed, but only abnormal results are displayed) Labs Reviewed  GROUP A STREP BY PCR  RESP PANEL BY RT-PCR (RSV, FLU A&B, COVID)  [id number]    EKG: None  Radiology: No results found.   Procedures   Medications Ordered in the ED  ibuprofen  (ADVIL ) tablet 600 mg (has no administration in time range)                                    Medical Decision Making Differential diagnosis includes but not limited to viral pharyngitis, GERD, strep pharyngitis, retropharyngeal abscess, peritonsillar abscess, other  ED course: Patient here for sore throat with bodyaches and chills that started today.  She is well-appearing, well-hydrated, she is eating and drinking on exam and tolerates secretions well, she speaks in full and clear sentences.  Posterior pharynx with mild erythema and tonsillar hypertrophy but no exudates, no kissing tonsils.  No tonsillar abscess.  Uvula is midline.  Discussed with patient that her symptoms are likely viral in nature, will treat supportively.  Considered steroids but patient has no significant swelling and is a type II diabetic so do not feel that benefits outweigh risk.  Patient advised on OTC medicine, follow-up and return precautions.        Final diagnoses:  Pharyngitis, unspecified etiology    ED Discharge Orders     None          Rita Harrison 09/18/24 1733    Geraldene Hamilton, MD 09/19/24 [id number]

## 2024-09-19 NOTE — Telephone Encounter (Signed)
Paperwork has been completed and faxed back.

## 2024-09-22 ENCOUNTER — Encounter: Payer: Self-pay | Admitting: Internal Medicine

## 2024-09-23 ENCOUNTER — Other Ambulatory Visit (HOSPITAL_COMMUNITY): Payer: Self-pay

## 2024-09-26 ENCOUNTER — Other Ambulatory Visit (HOSPITAL_COMMUNITY): Payer: Self-pay

## 2024-09-26 ENCOUNTER — Ambulatory Visit: Payer: Self-pay | Admitting: *Deleted

## 2024-09-26 ENCOUNTER — Ambulatory Visit: Admitting: Nurse Practitioner

## 2024-09-26 VITALS — BP 108/80 | HR 70 | Temp 97.8°F | Ht 66.0 in | Wt 201.5 lb

## 2024-09-26 DIAGNOSIS — E119 Type 2 diabetes mellitus without complications: Secondary | ICD-10-CM

## 2024-09-26 DIAGNOSIS — R059 Cough, unspecified: Secondary | ICD-10-CM

## 2024-09-26 MED ORDER — AZITHROMYCIN 250 MG PO TABS
ORAL_TABLET | ORAL | 0 refills | Status: AC
  Filled 2024-09-26: qty 6, 5d supply, fill #0

## 2024-09-26 MED ORDER — PREDNISONE 20 MG PO TABS
40.0000 mg | ORAL_TABLET | Freq: Every day | ORAL | 0 refills | Status: DC
  Filled 2024-09-26: qty 10, 5d supply, fill #0

## 2024-09-26 MED ORDER — PROMETHAZINE-DM 6.25-15 MG/5ML PO SYRP
5.0000 mL | ORAL_SOLUTION | Freq: Every day | ORAL | 0 refills | Status: DC
  Filled 2024-09-26: qty 118, 23d supply, fill #0

## 2024-09-26 NOTE — Patient Instructions (Signed)
 Prednisone  - take 2 tablets by mouth every morning. TAKE ONLY TYLENOL  for aches, pains, fever while taking prednisone . Take prednisone  with food. Call if you have severe abdominal pain, blood in stool, or blood in vomit.

## 2024-09-26 NOTE — Progress Notes (Signed)
 Established Patient Office Visit  Subjective   Patient ID: Rita Harrison, female    DOB: 1981/04/13  Age: 43 y.o. MRN: [id number]  Chief Complaint  Patient presents with   Cough    Coughing for a 5 days, went from wet to dry cough, pt stated cough is worse at night and also when sitting but fine when moving around.    Discussed the use of AI scribe software for clinical note transcription with the patient, who gave verbal consent to proceed.  History of Present Illness Rita Harrison is a 43 year old female with diabetes who presents with a persistent cough.  Cough and upper respiratory symptoms - Persistent cough for 8 days, worsening at night and disrupting sleep - Cough is aggravated when lying down, causing frequent awakenings - Initial symptoms included a sore throat - Sputum is sometimes clear and sometimes yellow - Robitussin DM provides partial relief - No ear pain - No history of asthma or lung disease - Tests for strep, COVID, influenza, and RSV were negative at UC  Diabetes mellitus - Most recent hemoglobin A1c was 6.3      ROS: see HPI    Objective:     BP 108/80   Pulse 70   Temp 97.8 F (36.6 C) (Temporal)   Ht 5' 6 (1.676 m)   Wt 201 lb 8 oz (91.4 kg)   LMP 03/09/2024 (Approximate)   SpO2 97%   BMI 32.52 kg/m    Physical Exam Vitals reviewed.  Constitutional:      General: She is not in acute distress.    Appearance: Normal appearance.  HENT:     Head: Normocephalic and atraumatic.  Cardiovascular:     Rate and Rhythm: Normal rate and regular rhythm.     Pulses: Normal pulses.     Heart sounds: Normal heart sounds.  Pulmonary:     Effort: Pulmonary effort is normal.     Breath sounds: Normal breath sounds.  Skin:    General: Skin is warm and dry.  Neurological:     General: No focal deficit present.     Mental Status: She is alert and oriented to person, place, and time.  Psychiatric:        Mood and Affect: Mood normal.         Behavior: Behavior normal.        Judgment: Judgment normal.      No results found for any visits on 09/26/24.    The 10-year ASCVD risk score (Arnett DK, et al., 2019) is: 5.1%    Assessment & Plan:   Problem List Items Addressed This Visit       Other   Cough - Primary   Acute viral upper respiratory infection with persistent cough Negative for strep, COVID, flu, and RSV. Viral etiology suspected. Discussed potential for prolonged cough and contagion risk. Explained azithromycin and steroid use, noting steroid impact on blood sugar. - Prescribe azithromycin (Z-Pak) for use if symptoms worsen or persist beyond 14 days. - Prescribe promethazine  dextromethorphan for nighttime cough management. -Prescribe prednisone  40mg /day if cough syrup is insufficient, with caution regarding blood sugar. - Advise mask use and infection prevention around immunocompromised individuals. - Instruct to return if cough persists for two months for further evaluation, including possible chest X-ray.  Type 2 diabetes mellitus, well controlled Diabetes well controlled with A1c of 6.3. Informed about risk of blood sugar elevation with short-term steroid use.      Relevant  Medications   azithromycin (ZITHROMAX) 250 MG tablet   promethazine -dextromethorphan (PROMETHAZINE -DM) 6.25-15 MG/5ML syrup   predniSONE  (DELTASONE ) 20 MG tablet  Assessment and Plan Assessment & Plan Acute viral upper respiratory infection with persistent cough Negative for strep, COVID, flu, and RSV. Viral etiology suspected. Discussed potential for prolonged cough and contagion risk. Explained azithromycin and steroid use, noting steroid impact on blood sugar. - Prescribe azithromycin (Z-Pak) for use if symptoms worsen or persist beyond 14 days. - Prescribe promethazine  dextromethorphan for nighttime cough management. -Prescribe prednisone  40mg /day if cough syrup is insufficient, with caution regarding blood sugar. -  Advise mask use and infection prevention around immunocompromised individuals. - Instruct to return if cough persists for two months for further evaluation, including possible chest X-ray.  Type 2 diabetes mellitus, well controlled Diabetes well controlled with A1c of 6.3. Informed about risk of blood sugar elevation with short-term steroid use.    Return if symptoms worsen or fail to improve.    Rita FORBES Pereyra, NP

## 2024-09-26 NOTE — Assessment & Plan Note (Signed)
 Acute viral upper respiratory infection with persistent cough Negative for strep, COVID, flu, and RSV. Viral etiology suspected. Discussed potential for prolonged cough and contagion risk. Explained azithromycin and steroid use, noting steroid impact on blood sugar. - Prescribe azithromycin (Z-Pak) for use if symptoms worsen or persist beyond 14 days. - Prescribe promethazine  dextromethorphan for nighttime cough management. -Prescribe prednisone  40mg /day if cough syrup is insufficient, with caution regarding blood sugar. - Advise mask use and infection prevention around immunocompromised individuals. - Instruct to return if cough persists for two months for further evaluation, including possible chest X-ray.  Type 2 diabetes mellitus, well controlled Diabetes well controlled with A1c of 6.3. Informed about risk of blood sugar elevation with short-term steroid use.

## 2024-09-26 NOTE — Telephone Encounter (Signed)
 FYI Only or Action Required?: FYI only for provider.  Patient was last seen in primary care on 09/03/2024 by Joshua Debby CROME, MD.  Called Nurse Triage reporting Cough.  Symptoms began several days ago.  Interventions attempted: OTC medications: Robitussin .  Symptoms are: unchanged.  Triage Disposition: See Physician Within 24 Hours  Patient/caregiver understands and will follow disposition?: yes   Reason for Disposition  [1] Continuous (nonstop) coughing interferes with work or school AND [2] no improvement using cough treatment per Care Advice  Answer Assessment - Initial Assessment Questions 1. ONSET: When did the cough begin?      Monday 2. SEVERITY: How bad is the cough today?      At night 3. SPUTUM: Describe the color of your sputum (e.g., none, dry cough; clear, white, yellow, green)     Green mucus 4. HEMOPTYSIS: Are you coughing up any blood? If Yes, ask: How much? (e.g., flecks, streaks, tablespoons, etc.)     no 5. DIFFICULTY BREATHING: Are you having difficulty breathing? If Yes, ask: How bad is it? (e.g., mild, moderate, severe)      yes 6. FEVER: Do you have a fever? If Yes, ask: What is your temperature, how was it measured, and when did it start?     no  10. OTHER SYMPTOMS: Do you have any other symptoms? (e.g., runny nose, wheezing, chest pain)       Cough only  Protocols used: Cough - Acute Productive-A-AH   Copied from CRM #[id number]. Topic: Clinical - Red Word Triage >> Sep 26, 2024  8:56 AM Roselie BROCKS wrote: [facility] that prompted transfer to Nurse Triage: Patient states she has a really bad cough, making her whole body hurt, coughing up phlegm. And its making it hard to work.

## 2024-10-03 ENCOUNTER — Other Ambulatory Visit (HOSPITAL_COMMUNITY): Payer: Self-pay

## 2024-10-06 ENCOUNTER — Other Ambulatory Visit: Payer: Self-pay | Admitting: Internal Medicine

## 2024-10-06 ENCOUNTER — Other Ambulatory Visit (HOSPITAL_COMMUNITY): Payer: Self-pay

## 2024-10-06 DIAGNOSIS — I10 Essential (primary) hypertension: Secondary | ICD-10-CM

## 2024-10-07 ENCOUNTER — Other Ambulatory Visit: Payer: Self-pay

## 2024-10-15 DIAGNOSIS — Z0279 Encounter for issue of other medical certificate: Secondary | ICD-10-CM

## 2024-10-28 ENCOUNTER — Other Ambulatory Visit (HOSPITAL_COMMUNITY): Payer: Self-pay

## 2024-11-20 ENCOUNTER — Other Ambulatory Visit: Payer: Self-pay

## 2024-11-20 NOTE — Progress Notes (Signed)
 Specialty Pharmacy Refill Coordination Note  Rita Harrison is a 43 y.o. female assessed today regarding refills of clinic administered specialty medication(s) OnabotulinumtoxinA  (Botox )   Clinic requested Courier to Provider Office   Delivery date: 12/01/24   Verified address: [facility] [address]   Medication will be filled on: 11/28/24  Appointment 12/05/24.

## 2024-11-25 ENCOUNTER — Telehealth: Payer: Self-pay

## 2024-11-25 NOTE — Telephone Encounter (Signed)
 PA expire 11/28/24 for Botox .   Botox  to be delivered 12/01/24

## 2024-11-26 ENCOUNTER — Telehealth: Payer: Self-pay | Admitting: Pharmacy Technician

## 2024-11-26 ENCOUNTER — Other Ambulatory Visit (HOSPITAL_COMMUNITY): Payer: Self-pay

## 2024-11-26 NOTE — Telephone Encounter (Signed)
 Pharmacy Patient Advocate Encounter   Received notification from Pt Calls Messages that prior authorization for BOTOX  200 is required/requested.   Insurance verification completed.   The patient is insured through [facility]   Per test claim: PA required; PA submitted to above mentioned insurance via Latent Key/confirmation #/EOC [facility] Status is pending

## 2024-11-26 NOTE — Telephone Encounter (Signed)
 PA has been submitted, and telephone encounter has been created. Please see telephone encounter dated 12.10.25.

## 2024-11-27 ENCOUNTER — Ambulatory Visit: Payer: Self-pay

## 2024-11-27 NOTE — Telephone Encounter (Signed)
 FYI Only or Action Required?: FYI only for provider: appointment scheduled on 11/28/24.  Patient was last seen in primary care on 09/26/2024 by Rita Lauraine BRAVO, NP.  Called Nurse Triage reporting Rash.  Symptoms began rash under breasts x 2 weeks; rash behind ears x 5-6 years.  Interventions attempted: Prescription medications: Derma smooth, hydrocortisone  cream, oil; OTC treatment: baby powder.  Symptoms are: gradually worsening.  Triage Disposition: See Physician Within 24 Hours  Patient/caregiver understands and will follow disposition?: Yes             Copied from CRM #[id number]. Topic: Clinical - Red Word Triage >> Nov 27, 2024 12:29 PM Roselie BROCKS wrote: Red Word that prompted transfer to Nurse Triage: Patient called for an appointment ,but states she is having a rash behind her ears and down under the breasts, that is very painful and itchy Reason for Disposition  [1] Localized rash is very painful AND [2] no fever  Answer Assessment - Initial Assessment Questions 1. APPEARANCE of RASH: What does the rash look like? (e.g., blisters, dry flaky skin, red spots, redness, sores)     Breast rash: darkened skin. Behind right ear rash: dry, chapped looking and looks white.  2. LOCATION: Where is the rash located?      Under both breasts . Behind right ear.  3. NUMBER: How many spots are there?      Worse under the left breast, mild under the right breast. 2 large spots. Behind right ear, starts at the bottom earlobe and goes up to mid/back scalp.  4. SIZE: How big are the spots? (e.g., inches, cm; or compare to size of pinhead, tip of pen, eraser, pea)      Large under breast. Large behind ear.  5. ONSET: When did the rash start?      Breast rash x 2 weeks. Rash behind right ear has been 5-6 years.  6. ITCHING: Does the rash itch? If Yes, ask: How bad is the itch?  (Scale 0-10; or none, mild, moderate, severe)     Yes.  Severe under breasts. Severe rash  behind ear at night.  7. PAIN: Does the rash hurt? If Yes, ask: How bad is the pain?  (Scale 0-10; or none, mild, moderate, severe)     Yes. Moderate.  8. OTHER SYMPTOMS: Do you have any other symptoms? (e.g., fever)     No fever.  Protocols used: Rash or Redness - Localized-A-AH

## 2024-11-28 ENCOUNTER — Telehealth: Admitting: Nurse Practitioner

## 2024-11-28 ENCOUNTER — Other Ambulatory Visit (HOSPITAL_COMMUNITY): Payer: Self-pay

## 2024-11-28 ENCOUNTER — Other Ambulatory Visit: Payer: Self-pay

## 2024-11-28 DIAGNOSIS — R21 Rash and other nonspecific skin eruption: Secondary | ICD-10-CM | POA: Diagnosis not present

## 2024-11-28 DIAGNOSIS — L989 Disorder of the skin and subcutaneous tissue, unspecified: Secondary | ICD-10-CM | POA: Diagnosis not present

## 2024-11-28 DIAGNOSIS — K219 Gastro-esophageal reflux disease without esophagitis: Secondary | ICD-10-CM

## 2024-11-28 MED ORDER — OMEPRAZOLE 20 MG PO CPDR
20.0000 mg | DELAYED_RELEASE_CAPSULE | Freq: Every day | ORAL | 1 refills | Status: AC
  Filled 2024-11-28: qty 30, 30d supply, fill #0

## 2024-11-28 MED ORDER — CLOTRIMAZOLE 1 % EX CREA
1.0000 | TOPICAL_CREAM | Freq: Two times a day (BID) | CUTANEOUS | 1 refills | Status: AC
  Filled 2024-11-28: qty 28, 14d supply, fill #0

## 2024-11-28 NOTE — Assessment & Plan Note (Signed)
 Intertrigo under breast Rash likely due to fungal overgrowth, resolved previously with antifungal cream. - Prescribed clotrimazole  cream twice daily. - Advised keeping area dry by patting after showering. - Recommended wearing supportive bra.

## 2024-11-28 NOTE — Progress Notes (Signed)
° °  Established Patient Office Visit  An audio/visual tele-health visit was completed today for this patient. I connected with  Rita Harrison on 11/28/2024 utilizing audio/visual technology and verified that I am speaking with the correct person using two identifiers. The patient was located at their car, and I was located at the office of [facility] at [facility] during the encounter. I discussed the limitations of evaluation and management by telemedicine. The patient expressed understanding and agreed to proceed.     Subjective   Patient ID: Rita Harrison, female    DOB: Jun 24, 1981  Age: 43 y.o. MRN: [id number]  Chief Complaint  Patient presents with   Rash    Rash has been going for the past 2 weeks and with patches on it.    Discussed the use of AI scribe software for clinical note transcription with the patient, who gave verbal consent to proceed.  History of Present Illness Rita Harrison is a 43 year old female who presents with a rash under the breast fold and behind the ear.  Intertriginous rash (submammary fold) - Pruritic rash under the breast fold present for a couple of weeks - No bleeding from the rash - Similar rash occurred a few years ago and responded to clotrimazole  cream  Chronic postauricular and scalp rash - Persistent rash behind the ear for several years - Rash extends from the back of the scalp to the ear - No prior diagnosis of scalp psoriasis - Fluocinolone , Clindamycin  lotion, and hydrocortisone  cream were not effective  Constitutional and systemic symptoms - No fevers - No joint pain  Recent respiratory symptoms - Recent cough resolved after completing a course of prednisone   GERD - Requesting refill of omeprazole       ROS: see HPI    Objective:     There were no vitals taken for this visit.   Physical Exam Comprehensive physical exam not completed today as office visit was conducted remotely.  Patient appears  well over video.  Patient was alert and oriented, and appeared to have appropriate judgment.   No results found for any visits on 11/28/24.    The 10-year ASCVD risk score (Arnett DK, et al., 2019) is: 5.1%    Assessment & Plan:   Problem List Items Addressed This Visit       Digestive   Gastroesophageal reflux disease   Relevant Medications   omeprazole  (PRILOSEC) 20 MG capsule     Musculoskeletal and Integument   Scalp lesion   Relevant Orders   Ambulatory referral to Dermatology   Rash - Primary   Relevant Medications   clotrimazole  (LOTRIMIN ) 1 % cream    Assessment and Plan Assessment & Plan Intertrigo under breast Rash likely due to fungal overgrowth, resolved previously with antifungal cream. - Prescribed clotrimazole  cream twice daily. - Advised keeping area dry by patting after showering. - Recommended wearing supportive bra.  Chronic scalp and auricular rash, suspect psoriasis Chronic rash not responding to current treatment, possible psoriasis. - Referred to dermatologist for evaluation  Gastroesophageal reflux disease Managed with omeprazole  20 mg daily. - Sent refill for omeprazole .    Return if symptoms worsen or fail to improve.    Rita FORBES Pereyra, NP

## 2024-11-28 NOTE — Assessment & Plan Note (Signed)
 Gastroesophageal reflux disease Managed with omeprazole  20 mg daily. - Sent refill for omeprazole .

## 2024-11-28 NOTE — Telephone Encounter (Signed)
 Pharmacy Patient Advocate Encounter  Received notification from [employer] that Prior Authorization for BOTOX  200 has been APPROVED from 12.11.25 to 12.10.26. Unable to obtain price due to refill too soon rejection, last fill date 12.4.25 next available fill date2.20.26   PA #/Case ID/Reference #: [id number]

## 2024-11-28 NOTE — Assessment & Plan Note (Signed)
 Chronic scalp and auricular rash, suspect psoriasis Chronic rash not responding to current treatment, possible psoriasis. - Referred to dermatologist for evaluation

## 2024-12-01 ENCOUNTER — Other Ambulatory Visit: Payer: Self-pay | Admitting: Internal Medicine

## 2024-12-01 ENCOUNTER — Other Ambulatory Visit (HOSPITAL_COMMUNITY): Payer: Self-pay

## 2024-12-01 DIAGNOSIS — E1129 Type 2 diabetes mellitus with other diabetic kidney complication: Secondary | ICD-10-CM

## 2024-12-01 MED ORDER — MOUNJARO 7.5 MG/0.5ML ~~LOC~~ SOAJ
7.5000 mg | SUBCUTANEOUS | 0 refills | Status: AC
  Filled 2024-12-01: qty 6, 84d supply, fill #0

## 2024-12-05 ENCOUNTER — Ambulatory Visit: Admitting: Neurology

## 2024-12-05 DIAGNOSIS — G43709 Chronic migraine without aura, not intractable, without status migrainosus: Secondary | ICD-10-CM

## 2024-12-05 MED ORDER — ONABOTULINUMTOXINA 100 UNITS IJ SOLR
200.0000 [IU] | Freq: Once | INTRAMUSCULAR | Status: AC
  Administered 2024-12-05: 155 [IU] via INTRAMUSCULAR

## 2024-12-05 NOTE — Progress Notes (Signed)

## 2024-12-31 ENCOUNTER — Encounter: Payer: Self-pay | Admitting: Nurse Practitioner

## 2024-12-31 ENCOUNTER — Encounter: Payer: Self-pay | Admitting: Family Medicine

## 2025-01-01 ENCOUNTER — Other Ambulatory Visit: Payer: Self-pay | Admitting: Nurse Practitioner

## 2025-01-01 ENCOUNTER — Other Ambulatory Visit (HOSPITAL_COMMUNITY): Payer: Self-pay

## 2025-01-01 DIAGNOSIS — R21 Rash and other nonspecific skin eruption: Secondary | ICD-10-CM

## 2025-01-01 MED ORDER — NYSTATIN-TRIAMCINOLONE 100000-0.1 UNIT/GM-% EX OINT
1.0000 | TOPICAL_OINTMENT | Freq: Two times a day (BID) | CUTANEOUS | 0 refills | Status: AC
  Filled 2025-01-01: qty 30, 15d supply, fill #0

## 2025-03-13 ENCOUNTER — Ambulatory Visit: Payer: Self-pay | Admitting: Neurology
